# Patient Record
Sex: Male | Born: 1935 | Race: White | Hispanic: No | Marital: Married | Smoking: Never smoker
Health system: Southern US, Community
[De-identification: ages and names within clinical notes are randomized; demographics above are authoritative.]

## PROBLEM LIST (undated history)

## (undated) DIAGNOSIS — N200 Calculus of kidney: Secondary | ICD-10-CM

## (undated) DIAGNOSIS — I10 Essential (primary) hypertension: Secondary | ICD-10-CM

## (undated) DIAGNOSIS — S0003XD Contusion of scalp, subsequent encounter: Secondary | ICD-10-CM

## (undated) DIAGNOSIS — D72829 Elevated white blood cell count, unspecified: Secondary | ICD-10-CM

## (undated) DIAGNOSIS — D351 Benign neoplasm of parathyroid gland: Secondary | ICD-10-CM

## (undated) DIAGNOSIS — R634 Abnormal weight loss: Secondary | ICD-10-CM

## (undated) DIAGNOSIS — I639 Cerebral infarction, unspecified: Secondary | ICD-10-CM

## (undated) DIAGNOSIS — S8010XS Contusion of unspecified lower leg, sequela: Secondary | ICD-10-CM

---

## 1998-05-03 ENCOUNTER — Other Ambulatory Visit: Admission: RE | Admit: 1998-05-03 | Discharge: 1998-05-03 | Payer: Self-pay

## 2001-11-27 ENCOUNTER — Emergency Department (HOSPITAL_COMMUNITY): Admission: EM | Admit: 2001-11-27 | Discharge: 2001-11-27 | Payer: Self-pay | Admitting: *Deleted

## 2002-11-16 ENCOUNTER — Other Ambulatory Visit: Admission: RE | Admit: 2002-11-16 | Discharge: 2002-11-16 | Payer: Self-pay | Admitting: Otolaryngology

## 2004-05-03 ENCOUNTER — Ambulatory Visit: Payer: Self-pay | Admitting: Internal Medicine

## 2008-05-14 ENCOUNTER — Emergency Department (HOSPITAL_COMMUNITY): Admission: EM | Admit: 2008-05-14 | Discharge: 2008-05-14 | Payer: Self-pay | Admitting: Emergency Medicine

## 2010-01-29 ENCOUNTER — Ambulatory Visit (HOSPITAL_COMMUNITY): Admission: RE | Admit: 2010-01-29 | Discharge: 2010-01-29 | Payer: Self-pay | Admitting: Surgery

## 2010-09-16 ENCOUNTER — Encounter: Payer: Self-pay | Admitting: Internal Medicine

## 2010-11-12 LAB — BASIC METABOLIC PANEL
BUN: 18 mg/dL (ref 6–23)
CO2: 28 mEq/L (ref 19–32)
Calcium: 12.1 mg/dL — ABNORMAL HIGH (ref 8.4–10.5)
Chloride: 108 mEq/L (ref 96–112)
Creatinine, Ser: 0.99 mg/dL (ref 0.4–1.5)
GFR calc Af Amer: >60 mL/min (ref >60)
GFR calc non Af Amer: >60 mL/min (ref >60)
Glucose, Bld: 124 mg/dL — ABNORMAL HIGH (ref 70–99)
Potassium: 3.1 mEq/L — ABNORMAL LOW (ref 3.5–5.1)
Sodium: 142 mEq/L (ref 135–145)

## 2010-11-12 LAB — DIFFERENTIAL
Basophils Absolute: 0.1 10*3/uL (ref 0.0–0.1)
Basophils Relative: 1 % (ref 0–1)
Eosinophils Absolute: 0.2 10*3/uL (ref 0.0–0.7)
Eosinophils Relative: 4 % (ref 0–5)
Lymphocytes Relative: 23 % (ref 12–46)
Lymphs Abs: 1.4 10*3/uL (ref 0.7–4.0)
Monocytes Absolute: 0.8 10*3/uL (ref 0.1–1.0)
Monocytes Relative: 12 % (ref 3–12)
Neutro Abs: 3.9 10*3/uL (ref 1.7–7.7)
Neutrophils Relative %: 61 % (ref 43–77)

## 2010-11-12 LAB — URINALYSIS, ROUTINE W REFLEX MICROSCOPIC
Bilirubin Urine: NEGATIVE
Glucose, UA: NEGATIVE mg/dL
Hgb urine dipstick: NEGATIVE
Ketones, ur: NEGATIVE mg/dL
Nitrite: NEGATIVE
Protein, ur: NEGATIVE mg/dL
Specific Gravity, Urine: 1.018 (ref 1.005–1.030)
Urobilinogen, UA: 1 mg/dL (ref 0.0–1.0)
pH: 7 (ref 5.0–8.0)

## 2010-11-12 LAB — POTASSIUM: Potassium: 3.6 mEq/L (ref 3.5–5.1)

## 2010-11-12 LAB — CBC
HCT: 41.6 % (ref 39.0–52.0)
Hemoglobin: 14.8 g/dL (ref 13.0–17.0)
MCHC: 35.5 g/dL (ref 30.0–36.0)
MCV: 99.6 fL (ref 78.0–100.0)
Platelets: 150 10*3/uL (ref 150–400)
RBC: 4.18 MIL/uL — ABNORMAL LOW (ref 4.22–5.81)
RDW: 13.4 % (ref 11.5–15.5)
WBC: 6.4 10*3/uL (ref 4.0–10.5)

## 2010-11-12 LAB — PROTIME-INR
INR: 1.12 (ref 0.00–1.49)
Prothrombin Time: 14.3 seconds (ref 11.6–15.2)

## 2011-05-27 LAB — DIFFERENTIAL
Basophils Absolute: 0
Basophils Relative: 1
Eosinophils Absolute: 0
Eosinophils Relative: 0
Lymphocytes Relative: 5 — ABNORMAL LOW
Lymphs Abs: 0.6 — ABNORMAL LOW
Monocytes Absolute: 0.7
Monocytes Relative: 7
Neutro Abs: 9.5 — ABNORMAL HIGH
Neutrophils Relative %: 88 — ABNORMAL HIGH

## 2011-05-27 LAB — BASIC METABOLIC PANEL
BUN: 14
CO2: 26
Calcium: 11.6 — ABNORMAL HIGH
Chloride: 102
Creatinine, Ser: 1.15
GFR calc Af Amer: >60
GFR calc non Af Amer: >60
Glucose, Bld: 147 — ABNORMAL HIGH
Potassium: 3.5
Sodium: 140

## 2011-05-27 LAB — URINE MICROSCOPIC-ADD ON

## 2011-05-27 LAB — URINALYSIS, ROUTINE W REFLEX MICROSCOPIC
Glucose, UA: NEGATIVE
Leukocytes, UA: NEGATIVE
Nitrite: NEGATIVE
Protein, ur: 100 — AB
Specific Gravity, Urine: 1.026
Urobilinogen, UA: 1
pH: 5.5

## 2011-05-27 LAB — CBC
HCT: 48.5
Hemoglobin: 16.9
MCHC: 34.7
MCV: 101.7 — ABNORMAL HIGH
Platelets: 148 — ABNORMAL LOW
RBC: 4.77
RDW: 13.2
WBC: 10.9 — ABNORMAL HIGH

## 2012-11-19 ENCOUNTER — Other Ambulatory Visit: Payer: Self-pay | Admitting: Dermatology

## 2013-03-31 DIAGNOSIS — G629 Polyneuropathy, unspecified: Secondary | ICD-10-CM | POA: Insufficient documentation

## 2013-03-31 DIAGNOSIS — N200 Calculus of kidney: Secondary | ICD-10-CM | POA: Insufficient documentation

## 2013-03-31 DIAGNOSIS — D351 Benign neoplasm of parathyroid gland: Secondary | ICD-10-CM

## 2013-03-31 HISTORY — DX: Benign neoplasm of parathyroid gland: D35.1

## 2013-03-31 HISTORY — DX: Calculus of kidney: N20.0

## 2013-04-01 DIAGNOSIS — E876 Hypokalemia: Secondary | ICD-10-CM | POA: Insufficient documentation

## 2013-04-01 DIAGNOSIS — I10 Essential (primary) hypertension: Secondary | ICD-10-CM | POA: Insufficient documentation

## 2016-02-01 ENCOUNTER — Ambulatory Visit (INDEPENDENT_AMBULATORY_CARE_PROVIDER_SITE_OTHER): Payer: Medicare Other | Admitting: Physician Assistant

## 2016-02-01 ENCOUNTER — Encounter: Payer: Self-pay | Admitting: Physician Assistant

## 2016-02-01 ENCOUNTER — Ambulatory Visit (INDEPENDENT_AMBULATORY_CARE_PROVIDER_SITE_OTHER): Payer: Medicare Other

## 2016-02-01 VITALS — BP 136/86 | HR 101 | Resp 15 | Wt 176.0 lb

## 2016-02-01 DIAGNOSIS — R112 Nausea with vomiting, unspecified: Secondary | ICD-10-CM | POA: Diagnosis not present

## 2016-02-01 DIAGNOSIS — R1084 Generalized abdominal pain: Secondary | ICD-10-CM

## 2016-02-01 MED ORDER — POLYETHYLENE GLYCOL 3350 17 GM/SCOOP PO POWD: 17.0000 g | Freq: Two times a day (BID) | ORAL | Status: DC | PRN

## 2016-02-01 MED ORDER — RANITIDINE HCL 150 MG PO TABS: 150.0000 mg | ORAL_TABLET | Freq: Two times a day (BID) | ORAL | Status: DC

## 2016-02-01 NOTE — Patient Instructions (Addendum)
Please get plenty of liquids (64 ounces of water at least) and make sure you are getting fiber in your diet (fruit, vegetables, whole grains and legumes).    IF you received an x-ray today, you will receive an invoice from [facility]. Please contact [facility] at [phone] with questions or concerns regarding your invoice.   IF you received labwork today, you will receive an invoice from [employer]. Please contact [facility] at [phone] with questions or concerns regarding your invoice.   Our billing staff will not be able to assist you with questions regarding bills from these companies.  You will be contacted with the lab results as soon as they are available. The fastest way to get your results is to activate your My Chart account. Instructions are located on the last page of this paperwork. If you have not heard from [place] regarding the results in 2 weeks, please contact this office.

## 2016-02-01 NOTE — Progress Notes (Signed)
Patient ID: Ian [name], male     DOB: 02-Jun-1926, 82 y.o.    MRN: [id number]  PCP: Phineas Inches, MD  Chief Complaint  Patient presents with  . Emesis    Since wednesday   . Abdominal Pain    Subjective:    HPI  Presents for evaluation of abdominal pain that began yesterday afternoon.  No BM since yesterday, which is highly unusual for him. Associated with nausea, vomiting and anorexia. By "vomiting" he means coughing up and spitting out phlegm.  He denies chest pain, SOB, dizziness, heartburn/indigestion, increased flatulence, fever/chills. No urinary symptoms.  Outside history reconciled notable for constipation, though he reports he's never had this before.  The patient was uncooperative with our staff during triage and rooming, refusing to provide information regarding his medications, pharmacy, and history.   Prior to Admission medications   Medication Sig Start Date End Date Taking? Authorizing Provider  amLODipine (NORVASC) 10 MG tablet  01/31/16   Historical Provider, MD  aspirin EC 81 MG tablet Take 81 mg by mouth.    Historical Provider, MD  hydrochlorothiazide (HYDRODIURIL) 25 MG tablet  12/19/15   Historical Provider, MD     No Known Allergies   Patient Active Problem List   Diagnosis Date Noted  . Benign hypertension 04/01/2013  . Decreased potassium in the blood 04/01/2013  . 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 Body mass index (BMI) of 27.0-27.9 in adult 03/31/2013  . Neuropathy ([place]) 03/31/2013  . Calculus of kidney 03/31/2013     History reviewed. No pertinent family history.   Social History   Social History  . Marital Status: Married    Spouse Name: N/A  . Number of Children: N/A  . Years of Education: N/A   Occupational History  . Not on file.   Social History Main Topics  . Smoking status: Never Smoker   . Smokeless tobacco: Not on file  . Alcohol Use: Not on file  . Drug Use: Not on file  . Sexual Activity: Not  on file   Other Topics Concern  . Not on file   Social History Narrative  . No narrative on file        Review of Systems  Constitutional: Positive for appetite change. Negative for fever, chills, diaphoresis and fatigue.  HENT: Negative for congestion, sore throat and trouble swallowing.   Eyes: Negative for photophobia, pain and visual disturbance.  Respiratory: Positive for cough (only due to having phlegm in his throat). Negative for choking, chest tightness, shortness of breath and wheezing.   Cardiovascular: Negative for chest pain, palpitations and leg swelling.  Gastrointestinal: Positive for nausea, vomiting (this is acutally gagging/spitting up phlegm), abdominal pain and constipation. Negative for diarrhea, blood in stool, abdominal distention, anal bleeding and rectal pain.  Genitourinary: Negative for dysuria, urgency, frequency and hematuria.  Musculoskeletal: Negative for myalgias and arthralgias.  Skin: Negative for rash.  Neurological: Negative for dizziness and headaches.  Hematological: Negative for adenopathy.         Objective:  Physical Exam  Constitutional: He is oriented to person, place, and time. He appears well-developed and well-nourished. He is active and cooperative. No distress.  BP 136/86 mmHg  Pulse 101  Resp 15  Wt 176 lb (79.833 kg)  SpO2 97%  HENT:  Head: Normocephalic and atraumatic.  Right Ear: Hearing normal.  Left Ear: Hearing normal.  Eyes: Conjunctivae are normal. No scleral icterus.  Neck: Normal range of motion. Neck supple.  No thyromegaly present.  Cardiovascular: Normal rate, regular rhythm and normal heart sounds.   Pulses:      Radial pulses are 2+ on the right side, and 2+ on the left side.  Pulmonary/Chest: Effort normal and breath sounds normal.  Abdominal: Normal appearance and bowel sounds are normal. There is no hepatosplenomegaly. There is tenderness in the periumbilical area. There is guarding (mild, inconsistent,  moreso in the LEFT quadrants). There is no rigidity, no rebound, no tenderness at McBurney's point and negative Murphy's sign.  Lymphadenopathy:       Head (right side): No tonsillar, no preauricular, no posterior auricular and no occipital adenopathy present.       Head (left side): No tonsillar, no preauricular, no posterior auricular and no occipital adenopathy present.    He has no cervical adenopathy.       Right: No supraclavicular adenopathy present.       Left: No supraclavicular adenopathy present.  Neurological: He is alert and oriented to person, place, and time. No sensory deficit.  Skin: Skin is warm, dry and intact. No rash noted. No cyanosis or erythema. Nails show no clubbing.  Psychiatric: He has a normal mood and affect. His speech is normal and behavior is normal.     Dg Abd Acute W/chest  02/01/2016  CLINICAL DATA:  Abdominal pain.  Nausea and vomiting. EXAM: DG ABDOMEN ACUTE W/ 1V CHEST COMPARISON:  None FINDINGS: There is no evidence of dilated bowel loops or free intraperitoneal air. No radiopaque calculi or other significant radiographic abnormality is seen. Heart size and mediastinal contours are within normal limits. Aortic atherosclerosis. Both lungs are clear. IMPRESSION: Negative abdominal radiographs.  No acute cardiopulmonary disease. Electronically Signed   By: Kerby Moors M.D.   On: 02/01/2016 10:09           Assessment & Plan:  1. Generalized abdominal pain Requested comment on stool burden with radiograph reading, which was not provided. Upon my own view, there is a moderate stool burden and large air within the colon, though no dilated loops. Suspect that he is experiencing symptoms due to constipation. Radiographs are reassuring for no obstruction. Miralax. Hydrate. Increase dietary fiber. Monitor for persistent/worsening symptoms. - DG Abd Acute W/Chest; Future - polyethylene glycol powder (GLYCOLAX/MIRALAX) powder; Take 17 g by mouth 2 (two) times  daily as needed.  Dispense: 3350 g; Refill: 0  2. Nausea and vomiting, intractability of vomiting not specified, unspecified vomiting type See above. Supportive care. Ranitidine. Monitor for persistent/worsening symptoms. - ranitidine (ZANTAC) 150 MG tablet; Take 1 tablet (150 mg total) by mouth 2 (two) times daily.  Dispense: 60 tablet; Refill: 0   Fara Chute, PA-C Physician Assistant-Certified [facility]

## 2016-02-01 NOTE — Progress Notes (Signed)
   Subjective:    Patient ID: Ian [name], male    DOB: [birth year], 82 y.o.   MRN: [id number] Chief Complaint  Patient presents with  . Emesis    Since wednesday   . Abdominal Pain    HPI Ian [name] is an 82yo male who presents today with diffuse abdominal pain x 1 days.  States he has not had BM since yesterday morning, which is unusual for him, he's usually very regular.  Admits to nausea, anorexia, and vomiting but on further questioning patient describes gagging while coughing up mucus.  Denies chest pain, heartburn, flatulence, fever, prior history of this pain.     Review of Systems All others negative except those listed in HPI.  Patient Active Problem List   Diagnosis Date Noted  . Benign hypertension 04/01/2013  . Decreased potassium in the blood 04/01/2013  . 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 Body mass index (BMI) of 27.0-27.9 in adult 03/31/2013  . Neuropathy ([place]) 03/31/2013  . Calculus of kidney 03/31/2013   No current outpatient prescriptions on file prior to visit.   No current facility-administered medications on file prior to visit.   No Known Allergies      Objective:   Physical Exam  Constitutional: He is oriented to person, place, and time. He appears well-developed and well-nourished.  Cardiovascular: Regular rhythm, normal heart sounds and intact distal pulses.  Tachycardia present.  Exam reveals no gallop and no friction rub.   No murmur heard. Pulmonary/Chest: Effort normal and breath sounds normal. No respiratory distress.  Abdominal: Soft. Bowel sounds are normal. There is tenderness in the right upper quadrant, epigastric area and left upper quadrant. There is no rigidity and no guarding.  Neurological: He is alert and oriented to person, place, and time.  Skin: Skin is warm and dry.   Dg Abd Acute W/chest  02/01/2016  CLINICAL DATA:  Abdominal pain.  Nausea and vomiting. EXAM: DG ABDOMEN ACUTE W/ 1V CHEST COMPARISON:  None  FINDINGS: There is no evidence of dilated bowel loops or free intraperitoneal air. No radiopaque calculi or other significant radiographic abnormality is seen. Heart size and mediastinal contours are within normal limits. Aortic atherosclerosis. Both lungs are clear. IMPRESSION: Negative abdominal radiographs.  No acute cardiopulmonary disease. Electronically Signed   By: Kerby Moors M.D.   On: 02/01/2016 10:09      Assessment & Plan:  1. Generalized abdominal pain Imaging indicates no acute process.  Fair amount of stool is present in colon.  Pain is likely due do constipation.  Patient prescribed miralax powder to help flush out his colon.  Instructed to drink at least 64oz of water a day and be sure to include fiber in his to help prevent constipation. - DG Abd Acute W/Chest; Future - polyethylene glycol powder (GLYCOLAX/MIRALAX) powder; Take 17 g by mouth 2 (two) times daily as needed.  Dispense: 3350 g; Refill: 0  2. Nausea and vomiting, intractability of vomiting not specified, unspecified vomiting type Patient given zantac to help with the feelings of nausea and gagging on mucus. - ranitidine (ZANTAC) 150 MG tablet; Take 1 tablet (150 mg total) by mouth 2 (two) times daily.  Dispense: 60 tablet; Refill: 0  Patient instructed to follow up if symptoms worsen or fail to improve with treatment or if any other questions or concerns arise.  Mihailo Sage P. Khylon Davies, PA-S

## 2017-10-25 ENCOUNTER — Other Ambulatory Visit: Payer: Self-pay

## 2017-10-25 ENCOUNTER — Emergency Department (HOSPITAL_COMMUNITY): Payer: Medicare Other

## 2017-10-25 ENCOUNTER — Inpatient Hospital Stay (HOSPITAL_COMMUNITY)
Admission: EM | Admit: 2017-10-25 | Discharge: 2017-10-29 | DRG: 064 | Disposition: A | Discharge status: Rehabilitation Facility | Payer: Medicare Other | Attending: Family Medicine | Admitting: Family Medicine

## 2017-10-25 ENCOUNTER — Inpatient Hospital Stay (HOSPITAL_COMMUNITY): Payer: Medicare Other

## 2017-10-25 DIAGNOSIS — F0390 Unspecified dementia without behavioral disturbance: Secondary | ICD-10-CM | POA: Diagnosis present

## 2017-10-25 DIAGNOSIS — R29810 Facial weakness: Secondary | ICD-10-CM | POA: Diagnosis present

## 2017-10-25 DIAGNOSIS — I2510 Atherosclerotic heart disease of native coronary artery without angina pectoris: Secondary | ICD-10-CM | POA: Diagnosis present

## 2017-10-25 DIAGNOSIS — R1310 Dysphagia, unspecified: Secondary | ICD-10-CM | POA: Diagnosis not present

## 2017-10-25 DIAGNOSIS — N179 Acute kidney failure, unspecified: Secondary | ICD-10-CM | POA: Diagnosis present

## 2017-10-25 DIAGNOSIS — L89202 Pressure ulcer of unspecified hip, stage 2: Secondary | ICD-10-CM | POA: Diagnosis present

## 2017-10-25 DIAGNOSIS — I69351 Hemiplegia and hemiparesis following cerebral infarction affecting right dominant side: Secondary | ICD-10-CM | POA: Diagnosis not present

## 2017-10-25 DIAGNOSIS — W19XXXS Unspecified fall, sequela: Secondary | ICD-10-CM

## 2017-10-25 DIAGNOSIS — R29700 NIHSS score 0: Secondary | ICD-10-CM | POA: Diagnosis present

## 2017-10-25 DIAGNOSIS — I493 Ventricular premature depolarization: Secondary | ICD-10-CM | POA: Diagnosis present

## 2017-10-25 DIAGNOSIS — R300 Dysuria: Secondary | ICD-10-CM | POA: Diagnosis present

## 2017-10-25 DIAGNOSIS — I639 Cerebral infarction, unspecified: Secondary | ICD-10-CM | POA: Diagnosis not present

## 2017-10-25 DIAGNOSIS — Z9181 History of falling: Secondary | ICD-10-CM

## 2017-10-25 DIAGNOSIS — I6349 Cerebral infarction due to embolism of other cerebral artery: Principal | ICD-10-CM | POA: Diagnosis present

## 2017-10-25 DIAGNOSIS — G92 Toxic encephalopathy: Secondary | ICD-10-CM | POA: Diagnosis present

## 2017-10-25 DIAGNOSIS — R740 Nonspecific elevation of levels of transaminase and lactic acid dehydrogenase [LDH]: Secondary | ICD-10-CM | POA: Diagnosis present

## 2017-10-25 DIAGNOSIS — R319 Hematuria, unspecified: Secondary | ICD-10-CM | POA: Diagnosis present

## 2017-10-25 DIAGNOSIS — Z7982 Long term (current) use of aspirin: Secondary | ICD-10-CM

## 2017-10-25 DIAGNOSIS — R55 Syncope and collapse: Secondary | ICD-10-CM | POA: Diagnosis present

## 2017-10-25 DIAGNOSIS — E872 Acidosis: Secondary | ICD-10-CM | POA: Diagnosis present

## 2017-10-25 DIAGNOSIS — I110 Hypertensive heart disease with heart failure: Secondary | ICD-10-CM | POA: Diagnosis not present

## 2017-10-25 DIAGNOSIS — T796XXA Traumatic ischemia of muscle, initial encounter: Secondary | ICD-10-CM

## 2017-10-25 DIAGNOSIS — Z7289 Other problems related to lifestyle: Secondary | ICD-10-CM

## 2017-10-25 DIAGNOSIS — R748 Abnormal levels of other serum enzymes: Secondary | ICD-10-CM | POA: Diagnosis present

## 2017-10-25 DIAGNOSIS — E041 Nontoxic single thyroid nodule: Secondary | ICD-10-CM | POA: Diagnosis present

## 2017-10-25 DIAGNOSIS — I452 Bifascicular block: Secondary | ICD-10-CM | POA: Diagnosis present

## 2017-10-25 DIAGNOSIS — R7989 Other specified abnormal findings of blood chemistry: Secondary | ICD-10-CM

## 2017-10-25 DIAGNOSIS — I5189 Other ill-defined heart diseases: Secondary | ICD-10-CM

## 2017-10-25 DIAGNOSIS — I10 Essential (primary) hypertension: Secondary | ICD-10-CM | POA: Diagnosis present

## 2017-10-25 DIAGNOSIS — I4510 Unspecified right bundle-branch block: Secondary | ICD-10-CM | POA: Diagnosis present

## 2017-10-25 DIAGNOSIS — I440 Atrioventricular block, first degree: Secondary | ICD-10-CM | POA: Diagnosis present

## 2017-10-25 DIAGNOSIS — I5030 Unspecified diastolic (congestive) heart failure: Secondary | ICD-10-CM | POA: Diagnosis not present

## 2017-10-25 DIAGNOSIS — R7401 Elevation of levels of liver transaminase levels: Secondary | ICD-10-CM | POA: Diagnosis present

## 2017-10-25 DIAGNOSIS — F05 Delirium due to known physiological condition: Secondary | ICD-10-CM | POA: Diagnosis present

## 2017-10-25 DIAGNOSIS — I63331 Cerebral infarction due to thrombosis of right posterior cerebral artery: Secondary | ICD-10-CM | POA: Diagnosis not present

## 2017-10-25 DIAGNOSIS — Z79890 Hormone replacement therapy (postmenopausal): Secondary | ICD-10-CM

## 2017-10-25 DIAGNOSIS — Z8673 Personal history of transient ischemic attack (TIA), and cerebral infarction without residual deficits: Secondary | ICD-10-CM

## 2017-10-25 DIAGNOSIS — J349 Unspecified disorder of nose and nasal sinuses: Secondary | ICD-10-CM | POA: Diagnosis present

## 2017-10-25 DIAGNOSIS — Y92009 Unspecified place in unspecified non-institutional (private) residence as the place of occurrence of the external cause: Secondary | ICD-10-CM

## 2017-10-25 DIAGNOSIS — R778 Other specified abnormalities of plasma proteins: Secondary | ICD-10-CM

## 2017-10-25 DIAGNOSIS — J320 Chronic maxillary sinusitis: Secondary | ICD-10-CM

## 2017-10-25 DIAGNOSIS — D72829 Elevated white blood cell count, unspecified: Secondary | ICD-10-CM | POA: Diagnosis present

## 2017-10-25 DIAGNOSIS — R4189 Other symptoms and signs involving cognitive functions and awareness: Secondary | ICD-10-CM | POA: Diagnosis present

## 2017-10-25 DIAGNOSIS — Z79899 Other long term (current) drug therapy: Secondary | ICD-10-CM

## 2017-10-25 DIAGNOSIS — S8010XS Contusion of unspecified lower leg, sequela: Secondary | ICD-10-CM | POA: Diagnosis not present

## 2017-10-25 DIAGNOSIS — I69391 Dysphagia following cerebral infarction: Secondary | ICD-10-CM | POA: Diagnosis not present

## 2017-10-25 DIAGNOSIS — R32 Unspecified urinary incontinence: Secondary | ICD-10-CM | POA: Diagnosis present

## 2017-10-25 DIAGNOSIS — R4182 Altered mental status, unspecified: Secondary | ICD-10-CM

## 2017-10-25 DIAGNOSIS — L8990 Pressure ulcer of unspecified site, unspecified stage: Secondary | ICD-10-CM | POA: Diagnosis present

## 2017-10-25 DIAGNOSIS — M6282 Rhabdomyolysis: Secondary | ICD-10-CM | POA: Diagnosis not present

## 2017-10-25 DIAGNOSIS — R634 Abnormal weight loss: Secondary | ICD-10-CM | POA: Diagnosis present

## 2017-10-25 DIAGNOSIS — W19XXXA Unspecified fall, initial encounter: Secondary | ICD-10-CM

## 2017-10-25 DIAGNOSIS — E876 Hypokalemia: Secondary | ICD-10-CM | POA: Diagnosis present

## 2017-10-25 DIAGNOSIS — E039 Hypothyroidism, unspecified: Secondary | ICD-10-CM | POA: Diagnosis present

## 2017-10-25 DIAGNOSIS — I519 Heart disease, unspecified: Secondary | ICD-10-CM | POA: Diagnosis not present

## 2017-10-25 DIAGNOSIS — K5900 Constipation, unspecified: Secondary | ICD-10-CM | POA: Diagnosis present

## 2017-10-25 DIAGNOSIS — I248 Other forms of acute ischemic heart disease: Secondary | ICD-10-CM | POA: Diagnosis not present

## 2017-10-25 DIAGNOSIS — J323 Chronic sphenoidal sinusitis: Secondary | ICD-10-CM

## 2017-10-25 DIAGNOSIS — R609 Edema, unspecified: Secondary | ICD-10-CM | POA: Diagnosis not present

## 2017-10-25 DIAGNOSIS — G8191 Hemiplegia, unspecified affecting right dominant side: Secondary | ICD-10-CM | POA: Diagnosis present

## 2017-10-25 DIAGNOSIS — R461 Bizarre personal appearance: Secondary | ICD-10-CM | POA: Diagnosis present

## 2017-10-25 DIAGNOSIS — S4991XD Unspecified injury of right shoulder and upper arm, subsequent encounter: Secondary | ICD-10-CM

## 2017-10-25 DIAGNOSIS — K922 Gastrointestinal hemorrhage, unspecified: Secondary | ICD-10-CM | POA: Diagnosis not present

## 2017-10-25 DIAGNOSIS — R42 Dizziness and giddiness: Secondary | ICD-10-CM

## 2017-10-25 DIAGNOSIS — I63431 Cerebral infarction due to embolism of right posterior cerebral artery: Secondary | ICD-10-CM | POA: Diagnosis not present

## 2017-10-25 DIAGNOSIS — I712 Thoracic aortic aneurysm, without rupture: Secondary | ICD-10-CM | POA: Diagnosis present

## 2017-10-25 DIAGNOSIS — I6300 Cerebral infarction due to thrombosis of unspecified precerebral artery: Secondary | ICD-10-CM | POA: Diagnosis not present

## 2017-10-25 DIAGNOSIS — S0990XA Unspecified injury of head, initial encounter: Secondary | ICD-10-CM | POA: Diagnosis present

## 2017-10-25 DIAGNOSIS — S0003XD Contusion of scalp, subsequent encounter: Secondary | ICD-10-CM

## 2017-10-25 DIAGNOSIS — T796XXS Traumatic ischemia of muscle, sequela: Secondary | ICD-10-CM | POA: Diagnosis not present

## 2017-10-25 DIAGNOSIS — R000 Tachycardia, unspecified: Secondary | ICD-10-CM | POA: Diagnosis present

## 2017-10-25 DIAGNOSIS — Z86018 Personal history of other benign neoplasm: Secondary | ICD-10-CM

## 2017-10-25 DIAGNOSIS — S0990XD Unspecified injury of head, subsequent encounter: Secondary | ICD-10-CM | POA: Diagnosis not present

## 2017-10-25 DIAGNOSIS — J322 Chronic ethmoidal sinusitis: Secondary | ICD-10-CM | POA: Diagnosis present

## 2017-10-25 DIAGNOSIS — Z87442 Personal history of urinary calculi: Secondary | ICD-10-CM

## 2017-10-25 HISTORY — DX: Benign neoplasm of parathyroid gland: D35.1

## 2017-10-25 HISTORY — DX: Contusion of unspecified lower leg, sequela: S80.10XS

## 2017-10-25 HISTORY — DX: Abnormal weight loss: R63.4

## 2017-10-25 HISTORY — DX: Elevated white blood cell count, unspecified: D72.829

## 2017-10-25 HISTORY — DX: Calculus of kidney: N20.0

## 2017-10-25 HISTORY — DX: Contusion of scalp, subsequent encounter: S00.03XD

## 2017-10-25 LAB — DIFFERENTIAL
Band Neutrophils: 0 %
Basophils Absolute: 0 10*3/uL (ref 0.0–0.1)
Basophils Relative: 0 %
Blasts: 0 %
Eosinophils Absolute: 0 10*3/uL (ref 0.0–0.7)
Eosinophils Relative: 0 %
Lymphocytes Relative: 4 %
Lymphs Abs: 0.6 10*3/uL — ABNORMAL LOW (ref 0.7–4.0)
Metamyelocytes Relative: 0 %
Monocytes Absolute: 1.2 10*3/uL — ABNORMAL HIGH (ref 0.1–1.0)
Monocytes Relative: 8 %
Myelocytes: 0 %
Neutro Abs: 13.8 10*3/uL — ABNORMAL HIGH (ref 1.7–7.7)
Neutrophils Relative %: 88 %
Other: 0 %
Promyelocytes Absolute: 0 %
nRBC: 0 /100 WBC

## 2017-10-25 LAB — URINALYSIS, ROUTINE W REFLEX MICROSCOPIC
Bacteria, UA: NONE SEEN
Bilirubin Urine: NEGATIVE
Glucose, UA: NEGATIVE mg/dL
Ketones, ur: 5 mg/dL — AB
Leukocytes, UA: NEGATIVE
Nitrite: NEGATIVE
Protein, ur: 100 mg/dL — AB
Specific Gravity, Urine: 1.024 (ref 1.005–1.030)
Squamous Epithelial / LPF: NONE SEEN
pH: 5 (ref 5.0–8.0)

## 2017-10-25 LAB — I-STAT CHEM 8, ED
BUN: 25 mg/dL — ABNORMAL HIGH (ref 6–20)
Calcium, Ion: 1.43 mmol/L — ABNORMAL HIGH (ref 1.15–1.40)
Chloride: 103 mmol/L (ref 101–111)
Creatinine, Ser: 1.1 mg/dL (ref 0.61–1.24)
Glucose, Bld: 181 mg/dL — ABNORMAL HIGH (ref 65–99)
HCT: 48 % (ref 39.0–52.0)
Hemoglobin: 16.3 g/dL (ref 13.0–17.0)
Potassium: 3.7 mmol/L (ref 3.5–5.1)
Sodium: 139 mmol/L (ref 135–145)
TCO2: 22 mmol/L (ref 22–32)

## 2017-10-25 LAB — COMPREHENSIVE METABOLIC PANEL
ALT: 39 U/L (ref 17–63)
AST: 140 U/L — ABNORMAL HIGH (ref 15–41)
Albumin: 3.7 g/dL (ref 3.5–5.0)
Alkaline Phosphatase: 66 U/L (ref 38–126)
Anion gap: 15 (ref 5–15)
BUN: 24 mg/dL — ABNORMAL HIGH (ref 6–20)
CO2: 22 mmol/L (ref 22–32)
Calcium: 12 mg/dL — ABNORMAL HIGH (ref 8.9–10.3)
Chloride: 101 mmol/L (ref 101–111)
Creatinine, Ser: 1.32 mg/dL — ABNORMAL HIGH (ref 0.61–1.24)
GFR calc Af Amer: 53 mL/min — ABNORMAL LOW (ref >60)
GFR calc non Af Amer: 46 mL/min — ABNORMAL LOW (ref >60)
Glucose, Bld: 186 mg/dL — ABNORMAL HIGH (ref 65–99)
Potassium: 3.7 mmol/L (ref 3.5–5.1)
Sodium: 138 mmol/L (ref 135–145)
Total Bilirubin: 1.8 mg/dL — ABNORMAL HIGH (ref 0.3–1.2)
Total Protein: 7.7 g/dL (ref 6.5–8.1)

## 2017-10-25 LAB — CBG MONITORING, ED: Glucose-Capillary: 180 mg/dL — ABNORMAL HIGH (ref 65–99)

## 2017-10-25 LAB — RAPID URINE DRUG SCREEN, HOSP PERFORMED
Amphetamines: NOT DETECTED
Barbiturates: NOT DETECTED
Benzodiazepines: NOT DETECTED
Cocaine: NOT DETECTED
Opiates: NOT DETECTED
Tetrahydrocannabinol: NOT DETECTED

## 2017-10-25 LAB — I-STAT TROPONIN, ED: Troponin i, poc: 0.11 ng/mL (ref 0.00–0.08)

## 2017-10-25 LAB — MAGNESIUM: Magnesium: 1.1 mg/dL — ABNORMAL LOW (ref 1.7–2.4)

## 2017-10-25 LAB — CBC
HCT: 46.8 % (ref 39.0–52.0)
Hemoglobin: 16.7 g/dL (ref 13.0–17.0)
MCH: 34.6 pg — ABNORMAL HIGH (ref 26.0–34.0)
MCHC: 35.7 g/dL (ref 30.0–36.0)
MCV: 97.1 fL (ref 78.0–100.0)
Platelets: 215 10*3/uL (ref 150–400)
RBC: 4.82 MIL/uL (ref 4.22–5.81)
RDW: 12.8 % (ref 11.5–15.5)
WBC: 15.6 10*3/uL — ABNORMAL HIGH (ref 4.0–10.5)

## 2017-10-25 LAB — I-STAT CG4 LACTIC ACID, ED
Lactic Acid, Venous: 5.64 mmol/L (ref 0.5–1.9)
Lactic Acid, Venous: 4.82 mmol/L (ref 0.5–1.9)

## 2017-10-25 LAB — CK: Total CK: 5905 U/L — ABNORMAL HIGH (ref 49–397)

## 2017-10-25 LAB — TROPONIN I: Troponin I: 0.13 ng/mL (ref <0.03)

## 2017-10-25 LAB — ETHANOL: Alcohol, Ethyl (B): <10 mg/dL (ref <10)

## 2017-10-25 LAB — HEMOGLOBIN A1C
Hgb A1c MFr Bld: 4.8 % (ref 4.8–5.6)
Mean Plasma Glucose: 91.06 mg/dL

## 2017-10-25 LAB — POC OCCULT BLOOD, ED: Fecal Occult Bld: NEGATIVE

## 2017-10-25 LAB — PROTIME-INR
INR: 1.06
Prothrombin Time: 13.7 seconds (ref 11.4–15.2)

## 2017-10-25 LAB — APTT: aPTT: 26 seconds (ref 24–36)

## 2017-10-25 LAB — TSH: TSH: 9.181 u[IU]/mL — ABNORMAL HIGH (ref 0.350–4.500)

## 2017-10-25 MED ORDER — SODIUM CHLORIDE 0.9 % IV BOLUS (SEPSIS)
500.0000 mL | Freq: Once | INTRAVENOUS | Status: AC
  Administered 2017-10-25: 500 mL via INTRAVENOUS

## 2017-10-25 MED ORDER — VITAMIN B-1 100 MG PO TABS: 100.0000 mg | ORAL_TABLET | Freq: Every day | ORAL | Status: DC

## 2017-10-25 MED ORDER — ASPIRIN EC 81 MG PO TBEC: 81.0000 mg | DELAYED_RELEASE_TABLET | Freq: Every day | ORAL | Status: DC

## 2017-10-25 MED ORDER — VANCOMYCIN HCL IN DEXTROSE 1-5 GM/200ML-% IV SOLN
1000.0000 mg | Freq: Once | INTRAVENOUS | Status: AC
Start: 2017-10-25 — End: 2017-10-25
  Administered 2017-10-25: 1000 mg via INTRAVENOUS
  Filled 2017-10-25: qty 200

## 2017-10-25 MED ORDER — GADOBENATE DIMEGLUMINE 529 MG/ML IV SOLN
14.0000 mL | Freq: Once | INTRAVENOUS | Status: AC | PRN
  Administered 2017-10-25: 14 mL via INTRAVENOUS

## 2017-10-25 MED ORDER — HEPARIN SODIUM (PORCINE) 5000 UNIT/ML IJ SOLN
5000.0000 [IU] | Freq: Three times a day (TID) | INTRAMUSCULAR | Status: DC
  Administered 2017-10-26 – 2017-10-27 (×6): 5000 [IU] via SUBCUTANEOUS
  Filled 2017-10-25 (×6): qty 1

## 2017-10-25 MED ORDER — THIAMINE HCL 100 MG/ML IJ SOLN: 100.0000 mg | Freq: Every day | INTRAMUSCULAR | Status: DC

## 2017-10-25 MED ORDER — ALPRAZOLAM 0.25 MG PO TABS
0.2500 mg | ORAL_TABLET | ORAL | Status: DC | PRN
  Administered 2017-10-26: 0.25 mg via ORAL
  Filled 2017-10-25: qty 1

## 2017-10-25 MED ORDER — ONDANSETRON HCL 4 MG/2ML IJ SOLN: 4.0000 mg | Freq: Four times a day (QID) | INTRAMUSCULAR | Status: DC | PRN

## 2017-10-25 MED ORDER — SODIUM CHLORIDE 0.9 % IV SOLN
INTRAVENOUS | Status: DC
  Administered 2017-10-25: 21:00:00 via INTRAVENOUS

## 2017-10-25 MED ORDER — ASPIRIN 81 MG PO CHEW: 324.0000 mg | CHEWABLE_TABLET | ORAL | Status: DC

## 2017-10-25 MED ORDER — PIPERACILLIN-TAZOBACTAM 3.375 G IVPB 30 MIN
3.3750 g | Freq: Once | INTRAVENOUS | Status: AC
  Administered 2017-10-25: 3.375 g via INTRAVENOUS
  Filled 2017-10-25: qty 50

## 2017-10-25 MED ORDER — STROKE: EARLY STAGES OF RECOVERY BOOK
Freq: Once | Status: AC
  Administered 2017-10-26: 06:00:00
  Filled 2017-10-25: qty 1

## 2017-10-25 MED ORDER — NITROGLYCERIN 0.4 MG SL SUBL: 0.4000 mg | SUBLINGUAL_TABLET | SUBLINGUAL | Status: DC | PRN

## 2017-10-25 MED ORDER — ATORVASTATIN CALCIUM 40 MG PO TABS: 40.0000 mg | ORAL_TABLET | Freq: Every day | ORAL | Status: DC

## 2017-10-25 MED ORDER — FOLIC ACID 1 MG PO TABS: 1.0000 mg | ORAL_TABLET | Freq: Every day | ORAL | Status: DC

## 2017-10-25 MED ORDER — SODIUM CHLORIDE 0.9 % IV BOLUS (SEPSIS)
1000.0000 mL | Freq: Once | INTRAVENOUS | Status: AC
  Administered 2017-10-25: 1000 mL via INTRAVENOUS

## 2017-10-25 MED ORDER — ASPIRIN 300 MG RE SUPP: 300.0000 mg | RECTAL | Status: DC

## 2017-10-25 MED ORDER — ADULT MULTIVITAMIN W/MINERALS CH: 1.0000 | ORAL_TABLET | Freq: Every day | ORAL | Status: DC

## 2017-10-25 NOTE — ED Notes (Signed)
Attempted report 

## 2017-10-25 NOTE — ED Notes (Signed)
Condom catheter applied with urinary bed bag, will in and out if no urine available.

## 2017-10-25 NOTE — ED Notes (Signed)
I Stat Lactic Acid results 5.64, and I Stat TropI 0.11 informed to Dr. Hart Robinsons. Results repeated back.

## 2017-10-25 NOTE — ED Notes (Signed)
Patient transported to X-ray 

## 2017-10-25 NOTE — ED Notes (Signed)
I Stat Lactic Acid results reported to Dr. Hart Robinsons, results repeated back.

## 2017-10-25 NOTE — ED Notes (Signed)
Attempted report. Per charge all back in 5 minutes.

## 2017-10-25 NOTE — Progress Notes (Signed)
Pt admitted from ED s/p fall, pt alert and oriented, denies any pain but observed to yell whenever he moves in bed, settled in bed with call light at bedside, tele monitor put and verified on pt, was however reassured, will continue to monitor. Carmela Rima

## 2017-10-25 NOTE — ED Provider Notes (Signed)
[name] Provider Note   CSN: [id number] Arrival date & time:        History   Chief Complaint Chief Complaint  Patient presents with  . Cerebrovascular Accident  . Altered Mental Status    HPI Ian [name] is a 84 y.o. male. Level 5 caveat-patient amnestic for event HPI   84 year old man brought in via EMS history of hypertension reports he was found on the bathroom floor.  Wife states that he was last known normal approximately 9 PM last night.  She states that he will went into the bathroom.  She is not sure what happened.  She went to find him about 10 PM and he was on the ground in the bathroom.  She describes as a very small bathroom.  She states that he told her he did not need any help and was just laying down.  She slept during the night and this morning he was still on the ground.  She states that eventually when he could not get up she called EMS.  He states he was fine yesterday.  He cooked dinner.  She has not noted that he has been sick. No past medical history on file.  Patient Active Problem List   Diagnosis Date Noted  . Benign hypertension 04/01/2013  . Decreased potassium in the blood 04/01/2013  . 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 Body mass index (BMI) of 27.0-27.9 in adult 03/31/2013  . Neuropathy 03/31/2013  . Calculus of kidney 03/31/2013    No past surgical history on file.     Home Medications    Prior to Admission medications   Medication Sig Start Date End Date Taking? Authorizing Provider  amLODipine (NORVASC) 10 MG tablet  01/31/16   [provider]  aspirin EC 81 MG tablet Take 81 mg by mouth.    [provider]  hydrochlorothiazide (HYDRODIURIL) 25 MG tablet  12/19/15   [provider]  polyethylene glycol powder (GLYCOLAX/MIRALAX) powder Take 17 g by mouth 2 (two) times daily as needed. 02/01/16   Harrison Mons, PA-C  ranitidine (ZANTAC) 150 MG tablet  Take 1 tablet (150 mg total) by mouth 2 (two) times daily. 02/01/16   Harrison Mons, PA-C    Family History No family history on file.  Social History Social History   Tobacco Use  . Smoking status: Never Smoker  Substance Use Topics  . Alcohol use: Not on file  . Drug use: Not on file     Allergies   Patient has no known allergies.   Review of Systems Review of Systems  Unable to perform ROS: Mental status change     Physical Exam Updated Vital Signs BP 138/90 (BP Location: Right Arm)   Pulse 83   Temp 98.7 F (37.1 C)   Resp 17   Ht 1.803 m (5\' 11" )   Wt 79.8 kg (176 lb)   SpO2 96%   BMI 24.55 kg/m   Physical Exam  Constitutional: He appears well-developed. No distress.  Unkempt, multiple contusion  HENT:  Head:    Right Ear: External ear normal.  Left Ear: External ear normal.  Erythema and edema right side of face Edema right eyelid Black crusting right side of mouth Mucous membranes are dry  Eyes: Pupils are equal, round, and reactive to light.  Patient does not gaze right or left- seems to have difficulty understanding - unclear visual acuity- will state which finger? Does not blink with  confrontation with fingers  Neck: No JVD present. No tracheal deviation present. No thyromegaly present.  Cardiovascular: Normal rate and regular rhythm.  Pulmonary/Chest: Effort normal.  Abdominal: Soft. Bowel sounds are normal.  Musculoskeletal:  Multiple contusions and abrasions of extremities - contusion lue, abrasions rue and rle, ttp right shoulder with contusion  Neurological: He is alert. He displays normal reflexes. No cranial nerve deficit. He exhibits normal muscle tone.  Patient oriented to person place and year He is able to move bilateral lower extremities equally against gravity lue against gravity Rue movement limited due to pain per patient  Skin: Skin is dry. Capillary refill takes less than 2 seconds.  Nursing note and vitals  reviewed.    ED Treatments / Results  Labs (all labs ordered are listed, but only abnormal results are displayed) Labs Reviewed  CBG MONITORING, ED - Abnormal; Notable for the following components:      Result Value   Glucose-Capillary 180 (*)    All other components within normal limits  ETHANOL  PROTIME-INR  APTT  CBC  DIFFERENTIAL  COMPREHENSIVE METABOLIC PANEL  RAPID URINE DRUG SCREEN, HOSP PERFORMED  URINALYSIS, ROUTINE W REFLEX MICROSCOPIC  I-STAT CHEM 8, ED  I-STAT TROPONIN, ED    EKG  EKG Interpretation  Date/Time:  Saturday October 25 2017 14:46:47 EST Ventricular Rate:  90 PR Interval:    QRS Duration: 145 QT Interval:  395 QTC Calculation: 484 R Axis:   -164 Text Interpretation:  Sinus or ectopic atrial rhythm Prolonged PR interval Consider dextrocardia Confirmed by Pattricia Boss [phone]) on 10/25/2017 4:56:20 PM       Radiology Dg Shoulder Right  Result Date: 10/25/2017 CLINICAL DATA:  Pain after fall. EXAM: RIGHT SHOULDER - 2+ VIEW COMPARISON:  None. FINDINGS: There is no evidence of fracture or dislocation. There is no evidence of arthropathy or other focal bone abnormality. Soft tissues are unremarkable. IMPRESSION: Negative. Electronically Signed   By: Dorise Bullion III M.D   On: 10/25/2017 16:42   Ct Head Wo Contrast  Result Date: 10/25/2017 CLINICAL DATA:  84 year old male with history of trauma from a fall to the floor yesterday evening at 10 p.m. Right-sided weakness. EXAM: CT HEAD WITHOUT CONTRAST TECHNIQUE: Contiguous axial images were obtained from the base of the skull through the vertex without intravenous contrast. COMPARISON:  None. FINDINGS: Brain: Moderate cerebral and cerebellar atrophy. Patchy and confluent areas of decreased attenuation are noted throughout the deep and periventricular white matter of the cerebral hemispheres bilaterally, compatible with chronic microvascular ischemic disease. No evidence of acute infarction, hemorrhage,  hydrocephalus, extra-axial collection or mass lesion/mass effect. Vascular: No hyperdense vessel or unexpected calcification. Skull: Normal. Negative for fracture or focal lesion. Sinuses/Orbits: No acute finding. Other: Soft tissue swelling in the scalp near the vertex. IMPRESSION: 1. Soft tissue swelling in the scalp near the vertex, presumably a hematoma. No acute intracranial abnormalities. 2. Moderate cerebral and cerebellar atrophy with extensive chronic microvascular ischemic changes in the cerebral white matter. Electronically Signed   By: Vinnie Langton M.D.   On: 10/25/2017 17:01   Dg Chest Port 1 View  Result Date: 10/25/2017 CLINICAL DATA:  Right-sided weakness, right-sided neglect EXAM: PORTABLE CHEST 1 VIEW COMPARISON:  01/25/2010 FINDINGS: There is no focal parenchymal opacity. There is no pleural effusion or pneumothorax. The heart and mediastinal contours are unremarkable. There is mild osteoarthritis of bilateral glenohumeral joints. IMPRESSION: No active disease. Electronically Signed   By: Kathreen Devoid   On: 10/25/2017 15:55  Dg Hips Bilat With Pelvis 3-4 Views  Result Date: 10/25/2017 CLINICAL DATA:  Pain after fall EXAM: DG HIP (WITH OR WITHOUT PELVIS) 3-4V BILAT COMPARISON:  None. FINDINGS: There is no evidence of hip fracture or dislocation. There is no evidence of arthropathy or other focal bone abnormality. IMPRESSION: Negative. Electronically Signed   By: Dorise Bullion III M.D   On: 10/25/2017 16:46    Procedures Procedures (including critical care time)  Medications Ordered in ED Medications - No data to display   Initial Impression / Assessment and Plan / ED Course  I have reviewed the triage vital signs and the nursing notes.  Pertinent labs & imaging results that were available during my care of the patient were reviewed by me and considered in my medical decision making (see chart for details).      5:28 PM Awaiting urine results. Plan foley IV hydration  being given Vitals:   10/25/17 1645 10/25/17 1700  BP: (!) 179/111 (!) 174/110  Pulse: 88 89  Resp:  13  Temp:    SpO2: 95% 100%    1-found on ground- fall vs syncope- multiple contusions and abrasions, right shoulder pain- no evidence of fracture noted CT head without acute changes 2-rhabdomyolysis 3-leukocytosis- ? Infection., cultures obtained, lactic acidosis noted - nonspecific given that patient has been laying on ground for extended period of time 4- altered mental status- patient does not have lateralized deficits although initially he was identified as having right-sided weakness.  This appears due to pain in the right shoulder.  However, cannot be ruled out for stroke at this time.  CT does not show any evidence of acute ischemia and he does outside the window for TPA and does not qualify as being a code stroke without being LVO positive. 5 hypertension patient was initially normotensive and is now hypertensive.  He has a known history of hypertension. 6 troponin is positive with no evidence of acute ischemia noted on EKG and no complaints of chest pain  CRITICAL CARE Performed by: Pattricia Boss Total critical care time: 60 minutes Critical care time was exclusive of separately billable procedures and treating other patients. Critical care was necessary to treat or prevent imminent or life-threatening deterioration. Critical care was time spent personally by me on the following activities: development of treatment plan with patient and/or surrogate as well as nursing, discussions with consultants, evaluation of patient's response to treatment, examination of patient, obtaining history from patient or surrogate, ordering and performing treatments and interventions, ordering and review of laboratory studies, ordering and review of radiographic studies, pulse oximetry and re-evaluation of patient's condition.   Discussed with Dr. Burr Medico, on for [facility] and will see for admission Final  Clinical Impressions(s) / ED Diagnoses   Final diagnoses:  Traumatic rhabdomyolysis, initial encounter ([place])  Elevated troponin  Altered mental status, unspecified altered mental status type    ED Discharge Orders    None       Pattricia Boss, MD 10/25/17 1757

## 2017-10-25 NOTE — H&P (Addendum)
[facility] Admission History and Physical Service Pager: [phone]  Patient name: Ian [name] Medical record number: [id number] Date of birth: November 18, 1925 Age: 84 y.o. Gender: male  Primary Care Provider: Bernerd Limbo, MD Consultants: None Code Status: Full code confirmed this admission  Chief Complaint: Syncope, rhabdomyolysis  Assessment and Plan: Ian [name] is a 84 y.o. male presenting with episode of syncope, unwitnessed and subsequent rhabdomyolysis. PMH is significant for hypertension.  #syncope - uncertain etiology; unwitnessed`fall and patient does not remember the event.  Consider stroke given new neurologic changes with RLE weakness and confusion worse than baseline.  CT head negative for acute hemorrhage, though positive for extensive chronic microvascular ischemic changes, and soft tissue hematoma.  MRI W/WO contrast pending.  Cardiac etiology also considered. Patient does have a elevated troponin which may be in the setting of rhabdomyolysis.  No ST-T changes noted on EKG.  No chest pain, dyspnea or history of palpitations and no known cardiac history.  Seizure additionally considered given blood in the patient's mouth.  He has never had a seizure before and if there is no witnessed seizure activity.  Additionally infection must be considered as cause of syncope.  Chest x-ray negative. Leukocytosis at 15.6.  UA pending, no skin breakdown source of possible infection. -Admit to telemetry, attending Dr. McDiarmid -MRI brain -Neurochecks every 4 hours -Cardiac echo, a.m. EKG -Started vital signs -PT/OT -S/p vancomycin and Zosyn x1 in the ED -Continue to monitor for fevers -We will hold off starting empiric antibiotics as we have no source for infection at this time -Consider neuro consult pending MRI brain results - keep SBP<220 until MRI results for permissive HTN  #elevated troponin -i-STAT troponin 0 0.11 in the emergency department.  No chest  pain.  Troponins may be elevated in the setting of acute Rhabdo vs. Demand ischemia. EKG negative for acute changes.   - trend troponin I - hold off lowering BP until MRI results - recheck AM EKG - echo as noted above - risk start with lipid panel, TSH, A1c  #Rhabdomyolysis - patient down for roughly 10 hours.  CK 01/02/2004.  LA 4.82 from 5.64 on admission.  No muscular pain.  Denying hematuria. - IVF 120 cc/hr - recheck AM CK  #AKI Cr 1.32, unknown baseline. Likely prerenal in setting of being found down after several hours. - IVF as above - monitor on AM BMP  #HTN - home meds include norvasc and HCTZ. Patient hypertensive in ED. - hold off restarting antihypertensives until stat MRI results  #transaminitis - AST elevated at 140 with ALT 39. Patient endorses daily alcohol use with a beer every day at lunch and a cocktail at dinner - monitor on CIWA  FEN/GI: IVNS 120cc/hr Prophylaxis: SCDs, hold off chemical prophylaxis due to soft tissue head hematoma  Disposition: Admit to telemetry, syncope workup and treatment for Rhabdo  History of Present Illness:  Ian [name] is a 84 y.o. male presenting with rhabdomyolysis and new neuro deficits.  Patient was in his usual state of health until yesterday evening after he cooked dinner when his wife found him lying on the floor on his right side curled around the toilet in the bathroom.  He was responsive, however seems confused.  She checked on him throughout the night because she was unable to pick him up.  He remained somewhat confused but responsive all night long. This morning when he was still unable to stand up she called EMS.  Patient is pleasantly confused  in the emergency department.  He is denying any chest pain or dyspnea.  He feels he may have had some dizziness with standing up too quickly over the last 24 hours but cannot be more specific.  He has no memory of falling, no memory of being on the floor in the bathroom.  He has never had a  seizure in the past.  He has never had a heart attack or stroke in the past.  He is denying congestion, sore throat, cough, dyspnea, nausea/vomiting/diarrhea, or urinary symptoms.  ED course: Initially EMS thought that the patient may have had a stroke due to right sided facial droop and right upper extremity weakness, which was subsequently thought to be dependent facial edema and a right arm injury ED provider per sign out over the phone.  Patient underwent CT head which was negative for acute hemorrhage, however did show soft tissue hematoma and extensive microvascular changes.  Initial labs were notable for lactic acidosis to 5.64 which improved to 4.82.  CK elevated at 5905.  Possible creatinine bump at 1.32 from 0.99 however that baseline is from 7 years ago.  Leukocytosis to 15.6 with a left shift.  Chest x-ray negative.  UDS not drawn.  Alcohol negative.  No acute electrolyte abnormalities.  Review Of Systems: Per HPI with the following additions:   Review of Systems  Constitutional: Positive for fever. Negative for chills, diaphoresis and weight loss.  HENT: Negative for congestion and sore throat.   Gastrointestinal: Negative for abdominal pain, constipation, diarrhea, nausea and vomiting.  Genitourinary: Negative for dysuria and urgency.  Neurological: Negative for speech change, weakness and headaches.    Patient Active Problem List   Diagnosis Date Noted  . Rhabdomyolysis 10/25/2017  . Benign hypertension 04/01/2013  . Decreased potassium in the blood 04/01/2013  . 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 Body mass index (BMI) of 27.0-27.9 in adult 03/31/2013  . Neuropathy 03/31/2013  . Calculus of kidney 03/31/2013    Past Medical History: No past medical history on file.  Past Surgical History: No past surgical history on file.  Social History: Social History   Tobacco Use  . Smoking status: Never Smoker  Substance Use Topics  . Alcohol use: Not on file   . Drug use: Not on file   Additional social history:  Drinks with lunch and dinner every day, beer at lunch, cocktail at dinner  Please also refer to relevant sections of EMR.  Family History: No family history on file. (If not completed, MUST add something in)  Allergies and Medications: No Known Allergies No current facility-administered medications on file prior to encounter.    Current Outpatient Medications on File Prior to Encounter  Medication Sig Dispense Refill  . amLODipine (NORVASC) 10 MG tablet     . aspirin EC 81 MG tablet Take 81 mg by mouth.    . hydrochlorothiazide (HYDRODIURIL) 25 MG tablet     . polyethylene glycol powder (GLYCOLAX/MIRALAX) powder Take 17 g by mouth 2 (two) times daily as needed. 3350 g 0  . ranitidine (ZANTAC) 150 MG tablet Take 1 tablet (150 mg total) by mouth 2 (two) times daily. 60 tablet 0    Objective: BP (!) 160/102 (BP Location: Right Arm)   Pulse 87   Temp 97.7 F (36.5 C) (Oral)   Resp 20   Ht 5\' 11"  (1.803 m)   Wt 160 lb 0.9 oz (72.6 kg)   SpO2 97%   BMI 22.32 kg/m  Exam: General: NAD, pleasant, conversant Eyes: EOMI, PERRLA, no conjunctival pallor or injection ENTM: Mucous membranes dry, no pharyngeal erythema or exudate, + dark dry blood noted on either side of his tongue, around his lips Neck: Supple Cardiovascular: RRR, no M/R/G, no JVD, no lower extremity edema Respiratory: CTA bilaterally, no W/R/G, comfortable work of breathing, speaks in full sentences Gastrointestinal: Soft, nontender, nondistended, no hepatosplenomegaly MSK: No acute deformity Derm: Ecchymosis and age marks noted over upper extremities laterally, + erythema on right side of the face, + mild edema noted over right side of the face Neuro: CN II through XII intact; initially appears as though there is a right facial droop, right ptosis however this seems to be due to edema.  LUE 5/5 strength.  RUE 4-5/5 but difficult to assess due to shoulder pain.  LLE  4/5 strength, RLE patient unable to feel me tapping on his leg and also unable to lift leg against gravity, unable to move toes on the right Psych: Appropriate, AAO x3, intermittently mildly confused  Labs and Imaging: CBC BMET  Recent Labs  Lab 10/25/17 1503 10/25/17 1525  WBC 15.6*  --   HGB 16.7 16.3  HCT 46.8 48.0  PLT 215  --    Recent Labs  Lab 10/25/17 1503 10/25/17 1525  NA 138 139  K 3.7 3.7  CL 101 103  CO2 22  --   BUN 24* 25*  CREATININE 1.32* 1.10  GLUCOSE 186* 181*  CALCIUM 12.0*  --      EKG: RBBB, no acute ST-T changes, NSR, no prior to compare CR 1.32 Lactic acid 5.64>> 4.82 CK 01/02/2004 AST 140  Dg Shoulder Right  Result Date: 10/25/2017 CLINICAL DATA:  Pain after fall. EXAM: RIGHT SHOULDER - 2+ VIEW COMPARISON:  None. FINDINGS: There is no evidence of fracture or dislocation. There is no evidence of arthropathy or other focal bone abnormality. Soft tissues are unremarkable. IMPRESSION: Negative. Electronically Signed   By: Dorise Bullion III M.D   On: 10/25/2017 16:42   Ct Head Wo Contrast  Addendum Date: 10/25/2017   ADDENDUM REPORT: 10/25/2017 18:09 ADDENDUM: Omitted from the original discussion is the presence of multifocal mucosal thickening throughout the paranasal sinuses, most evident in the ethmoid sinuses bilaterally, sphenoid sinus and in the maxillary sinuses (left greater than right). Inspissated secretions are noted in the left maxillary sinus. These findings suggest chronic sinusitis. Electronically Signed   By: Vinnie Langton M.D.   On: 10/25/2017 18:09   Result Date: 10/25/2017 CLINICAL DATA:  84 year old male with history of trauma from a fall to the floor yesterday evening at 10 p.m. Right-sided weakness. EXAM: CT HEAD WITHOUT CONTRAST TECHNIQUE: Contiguous axial images were obtained from the base of the skull through the vertex without intravenous contrast. COMPARISON:  None. FINDINGS: Brain: Moderate cerebral and cerebellar atrophy.  Patchy and confluent areas of decreased attenuation are noted throughout the deep and periventricular white matter of the cerebral hemispheres bilaterally, compatible with chronic microvascular ischemic disease. No evidence of acute infarction, hemorrhage, hydrocephalus, extra-axial collection or mass lesion/mass effect. Vascular: No hyperdense vessel or unexpected calcification. Skull: Normal. Negative for fracture or focal lesion. Sinuses/Orbits: No acute finding. Other: Soft tissue swelling in the scalp near the vertex. IMPRESSION: 1. Soft tissue swelling in the scalp near the vertex, presumably a hematoma. No acute intracranial abnormalities. 2. Moderate cerebral and cerebellar atrophy with extensive chronic microvascular ischemic changes in the cerebral white matter. Electronically Signed: By: Vinnie Langton M.D. On: 10/25/2017  17:01   Dg Chest Port 1 View  Result Date: 10/25/2017 CLINICAL DATA:  Right-sided weakness, right-sided neglect EXAM: PORTABLE CHEST 1 VIEW COMPARISON:  01/25/2010 FINDINGS: There is no focal parenchymal opacity. There is no pleural effusion or pneumothorax. The heart and mediastinal contours are unremarkable. There is mild osteoarthritis of bilateral glenohumeral joints. IMPRESSION: No active disease. Electronically Signed   By: Kathreen Devoid   On: 10/25/2017 15:55   Dg Hips Bilat With Pelvis 3-4 Views  Result Date: 10/25/2017 CLINICAL DATA:  Pain after fall EXAM: DG HIP (WITH OR WITHOUT PELVIS) 3-4V BILAT COMPARISON:  None. FINDINGS: There is no evidence of hip fracture or dislocation. There is no evidence of arthropathy or other focal bone abnormality. IMPRESSION: Negative. Electronically Signed   By: Dorise Bullion III M.D   On: 10/25/2017 16:46    Everrett Coombe, MD 10/25/2017, 8:24 PM PGY-2, [place] Intern pager: [phone], text pages welcome

## 2017-10-25 NOTE — ED Triage Notes (Signed)
Per wife patient found on bathroom floor last night at 2200. Wife went to bed and this AM patient still on floor. This afternoon patient still on floor and wife called EMS. Patient c/o right sided weakness and exhibits right sided neglect. Patient also has black coffee-ground looking particles on his mouth.

## 2017-10-25 NOTE — ED Notes (Signed)
Patient to be taken to CT after X-Ray.

## 2017-10-26 ENCOUNTER — Inpatient Hospital Stay (HOSPITAL_COMMUNITY): Payer: Medicare Other

## 2017-10-26 ENCOUNTER — Encounter (HOSPITAL_COMMUNITY): Payer: Self-pay

## 2017-10-26 DIAGNOSIS — S8010XS Contusion of unspecified lower leg, sequela: Secondary | ICD-10-CM

## 2017-10-26 DIAGNOSIS — I639 Cerebral infarction, unspecified: Secondary | ICD-10-CM | POA: Diagnosis present

## 2017-10-26 DIAGNOSIS — I452 Bifascicular block: Secondary | ICD-10-CM | POA: Diagnosis present

## 2017-10-26 DIAGNOSIS — J323 Chronic sphenoidal sinusitis: Secondary | ICD-10-CM

## 2017-10-26 DIAGNOSIS — I6300 Cerebral infarction due to thrombosis of unspecified precerebral artery: Secondary | ICD-10-CM

## 2017-10-26 DIAGNOSIS — D72829 Elevated white blood cell count, unspecified: Secondary | ICD-10-CM

## 2017-10-26 DIAGNOSIS — S0003XD Contusion of scalp, subsequent encounter: Secondary | ICD-10-CM

## 2017-10-26 DIAGNOSIS — R7401 Elevation of levels of liver transaminase levels: Secondary | ICD-10-CM | POA: Diagnosis present

## 2017-10-26 DIAGNOSIS — R7989 Other specified abnormal findings of blood chemistry: Secondary | ICD-10-CM

## 2017-10-26 DIAGNOSIS — R778 Other specified abnormalities of plasma proteins: Secondary | ICD-10-CM | POA: Diagnosis present

## 2017-10-26 DIAGNOSIS — R55 Syncope and collapse: Secondary | ICD-10-CM

## 2017-10-26 DIAGNOSIS — R4189 Other symptoms and signs involving cognitive functions and awareness: Secondary | ICD-10-CM | POA: Diagnosis present

## 2017-10-26 DIAGNOSIS — R461 Bizarre personal appearance: Secondary | ICD-10-CM | POA: Diagnosis present

## 2017-10-26 DIAGNOSIS — Y92009 Unspecified place in unspecified non-institutional (private) residence as the place of occurrence of the external cause: Secondary | ICD-10-CM

## 2017-10-26 DIAGNOSIS — R634 Abnormal weight loss: Secondary | ICD-10-CM

## 2017-10-26 DIAGNOSIS — J320 Chronic maxillary sinusitis: Secondary | ICD-10-CM

## 2017-10-26 DIAGNOSIS — W19XXXS Unspecified fall, sequela: Secondary | ICD-10-CM

## 2017-10-26 DIAGNOSIS — R42 Dizziness and giddiness: Secondary | ICD-10-CM

## 2017-10-26 DIAGNOSIS — S4991XD Unspecified injury of right shoulder and upper arm, subsequent encounter: Secondary | ICD-10-CM

## 2017-10-26 DIAGNOSIS — S0990XA Unspecified injury of head, initial encounter: Secondary | ICD-10-CM | POA: Diagnosis present

## 2017-10-26 DIAGNOSIS — F05 Delirium due to known physiological condition: Secondary | ICD-10-CM | POA: Diagnosis present

## 2017-10-26 DIAGNOSIS — L8990 Pressure ulcer of unspecified site, unspecified stage: Secondary | ICD-10-CM | POA: Diagnosis present

## 2017-10-26 DIAGNOSIS — J349 Unspecified disorder of nose and nasal sinuses: Secondary | ICD-10-CM | POA: Diagnosis present

## 2017-10-26 DIAGNOSIS — R740 Nonspecific elevation of levels of transaminase and lactic acid dehydrogenase [LDH]: Secondary | ICD-10-CM

## 2017-10-26 DIAGNOSIS — J322 Chronic ethmoidal sinusitis: Secondary | ICD-10-CM | POA: Diagnosis present

## 2017-10-26 HISTORY — DX: Contusion of unspecified lower leg, sequela: S80.10XS

## 2017-10-26 HISTORY — DX: Abnormal weight loss: R63.4

## 2017-10-26 HISTORY — DX: Contusion of scalp, subsequent encounter: S00.03XD

## 2017-10-26 HISTORY — DX: Elevated white blood cell count, unspecified: D72.829

## 2017-10-26 LAB — BASIC METABOLIC PANEL
Anion gap: 10 (ref 5–15)
BUN: 22 mg/dL — ABNORMAL HIGH (ref 6–20)
CO2: 23 mmol/L (ref 22–32)
Calcium: 10.9 mg/dL — ABNORMAL HIGH (ref 8.9–10.3)
Chloride: 105 mmol/L (ref 101–111)
Creatinine, Ser: 1.01 mg/dL (ref 0.61–1.24)
GFR calc Af Amer: >60 mL/min (ref >60)
GFR calc non Af Amer: >60 mL/min (ref >60)
Glucose, Bld: 101 mg/dL — ABNORMAL HIGH (ref 65–99)
Potassium: 2.9 mmol/L — ABNORMAL LOW (ref 3.5–5.1)
Sodium: 138 mmol/L (ref 135–145)

## 2017-10-26 LAB — LIPID PANEL
Cholesterol: 162 mg/dL (ref 0–200)
HDL: 86 mg/dL (ref >40)
LDL Cholesterol: 63 mg/dL (ref 0–99)
Total CHOL/HDL Ratio: 1.9 RATIO
Triglycerides: 66 mg/dL (ref <150)
VLDL: 13 mg/dL (ref 0–40)

## 2017-10-26 LAB — LACTIC ACID, PLASMA: Lactic Acid, Venous: 2.1 mmol/L (ref 0.5–1.9)

## 2017-10-26 LAB — CBC
HCT: 41.4 % (ref 39.0–52.0)
Hemoglobin: 14.5 g/dL (ref 13.0–17.0)
MCH: 34.2 pg — ABNORMAL HIGH (ref 26.0–34.0)
MCHC: 35 g/dL (ref 30.0–36.0)
MCV: 97.6 fL (ref 78.0–100.0)
Platelets: 169 10*3/uL (ref 150–400)
RBC: 4.24 MIL/uL (ref 4.22–5.81)
RDW: 13 % (ref 11.5–15.5)
WBC: 18 10*3/uL — ABNORMAL HIGH (ref 4.0–10.5)

## 2017-10-26 LAB — TROPONIN I
Troponin I: 0.19 ng/mL (ref <0.03)
Troponin I: 0.17 ng/mL (ref <0.03)

## 2017-10-26 LAB — ECHOCARDIOGRAM COMPLETE
Height: 71 in
Weight: 2560.86 oz

## 2017-10-26 LAB — T4, FREE: Free T4: 0.77 ng/dL (ref 0.61–1.12)

## 2017-10-26 LAB — CK: Total CK: 2067 U/L — ABNORMAL HIGH (ref 49–397)

## 2017-10-26 MED ORDER — DEXTROSE-NACL 5-0.9 % IV SOLN
INTRAVENOUS | Status: DC
  Administered 2017-10-26 – 2017-10-27 (×3): via INTRAVENOUS

## 2017-10-26 MED ORDER — LEVOTHYROXINE SODIUM 100 MCG IV SOLR
12.5000 ug | Freq: Every day | INTRAVENOUS | Status: DC
  Administered 2017-10-26: 12.5 ug via INTRAVENOUS
  Filled 2017-10-26 (×2): qty 5

## 2017-10-26 MED ORDER — CLOPIDOGREL BISULFATE 75 MG PO TABS
75.0000 mg | ORAL_TABLET | Freq: Every day | ORAL | Status: DC
  Administered 2017-10-27 – 2017-10-28 (×2): 75 mg via ORAL
  Filled 2017-10-26 (×2): qty 1

## 2017-10-26 MED ORDER — FLUTICASONE PROPIONATE 50 MCG/ACT NA SUSP
2.0000 | Freq: Every day | NASAL | Status: DC
  Administered 2017-10-27 – 2017-10-29 (×3): 2 via NASAL
  Filled 2017-10-26: qty 16

## 2017-10-26 MED ORDER — AMLODIPINE BESYLATE 5 MG PO TABS
5.0000 mg | ORAL_TABLET | Freq: Every day | ORAL | Status: DC
  Administered 2017-10-27 – 2017-10-28 (×2): 5 mg via ORAL
  Filled 2017-10-26 (×2): qty 1

## 2017-10-26 MED ORDER — POTASSIUM CHLORIDE 10 MEQ/100ML IV SOLN
10.0000 meq | INTRAVENOUS | Status: AC
  Administered 2017-10-26 (×4): 10 meq via INTRAVENOUS
  Filled 2017-10-26 (×6): qty 100

## 2017-10-26 MED ORDER — MAGNESIUM SULFATE 2 GM/50ML IV SOLN
2.0000 g | Freq: Once | INTRAVENOUS | Status: AC
  Administered 2017-10-26: 2 g via INTRAVENOUS
  Filled 2017-10-26: qty 50

## 2017-10-26 NOTE — Progress Notes (Signed)
*  PRELIMINARY RESULTS* Echocardiogram 2D Echocardiogram has been performed.  Leavy Cella 10/26/2017, 2:57 PM

## 2017-10-26 NOTE — Progress Notes (Signed)
Family Medicine Teaching Service Daily Progress Note Intern Pager: [phone]  Patient name: Ian [name] Medical record number: [id number] Date of birth: April 07, 1926 Age: 84 y.o. Gender: male  Primary Care Provider: Bernerd Limbo, MD Consultants: Neurology Code Status: Full  Pt Overview and Major Events to Date:  3/2: Admitted for syncope, rhabdomyolysis; brain MRI with "12 mm focus of reduced diffusion in right occipital lobe, probably acute/early subacute infarct"  Assessment and Plan: Ian [name] is a 84 y.o. male presenting with episode of syncope, unwitnessed and subsequent rhabdomyolysis. PMH is significant for hypertension and mild cognitive impairment.   #syncope - uncertain etiology; unwitnessed`fall and patient does not remember the event. Obtained MRI obtained despite negative head CT (aside from chronic microvascular changes) due RLE weakness and confusion worse than baseline. Cardiac etiology also considered. Patient does have a elevated troponin but in setting of rhabdomyolysis.  No T- ST changes noted on initial EKG. No chest pain, dyspnea or history of palpitations and no known cardiac history.  Seizure additionally considered given blood in the patient's mouth.  He has never had a seizure before and if there is no witnessed seizure activity. Low suspicion for infection with negative chest x-ray negative, no skin breakdown and UA with only hemoglobin. - Neurology consult, appreciate recommendations - Frequent neuro checks - Cardiac echo, repeat EKG - PT/OT/SLP - S/p vancomycin and Zosyn x1 in the ED - Continue to monitor for fevers; held off starting empiric antibiotics as we have no source for infection at this time - allow permissive HTN  #Probable acute stroke- infarct of R occipital lobe - Neurology consult - ECHO - carotid US - risk strat labs unremarkable  #AMS - worse than baseline with last Mini Mental status exam score of 21/30 in August 2018. Appears to have new  stroke. Ethanol and UDS negative.  - ordered sitter - ordered mittens, as patient pulled out foley this morning - may need to consider haldol but had prolonged QT on last EKG  #elevated troponin -i-STAT troponin 0.11 in the emergency department.  No chest pain.  Troponins may be elevated in the setting of acute Rapido vs. Demand ischemia. EKG negative for acute changes.   - trend troponin I --> 0.13 > 0.19 > 0.17 - permissive hypertension - recheck EKG - echo as noted above - risk start with lipid panel, TSH, A1c --> A1c 4.8; total cholesterol 162, HDL 86, LDL 63 - low mag; will replete and recheck tomorrow, 3/4  #Rhabdomyolysis - CK improving. Patient down for roughly 10 hours.  CK 01/02/2004.  LA 4.82 from 5.64 on admission.  TTP over R upper arm but right shoulder xray negative for fracture or dislocation.  - Continue IVF 120 cc/hr - recheck AM CK  #AKI Resolved. Cr 1.01 > 1.32, unknown baseline. Likely prerenal in setting of being found down after several hours. - IVF as above - monitor on AM BMP  #HTN - home meds include norvasc and HCTZ. Patient hypertensive in ED but not overnight. - hold unless SBP > 220  #transaminitis - AST elevated at 140 with ALT 39. Patient endorses daily alcohol use with a beer every day at lunch and a cocktail at dinner - monitor on CIWA  FEN/GI: IVNS 120cc/hr Prophylaxis: SCDs, hold off chemical prophylaxis due to soft tissue head hematoma  Disposition: Admit to telemetry, syncope and CVA workup and treatment for Rhabdo  Subjective:  Patient denies pain this morning. He requests to go home. He does not know why he  is still in the hospital. He denies pulling out his foley, but he has blood at urethra and foley with inflated balloon at his side.   Objective: Temp:  [97.7 F (36.5 C)-98.7 F (37.1 C)] 97.9 F (36.6 C) (03/03 0433) Pulse Rate:  [31-93] 93 (03/03 0433) Resp:  [13-32] 18 (03/03 0433) BP: (127-205)/(66-165) 130/66 (03/03  0433) SpO2:  [76 %-100 %] 98 % (03/03 0433) Weight:  [160 lb 0.9 oz (72.6 kg)-176 lb (79.8 kg)] 160 lb 0.9 oz (72.6 kg) (03/02 2000) Physical Exam: General: Resting in bed, watching TV Eyes: EOMI, PERRLA ENTM: Dry MMM, very dry lips Neck: Supple Cardiovascular: RRR, no M/R/G, no JVD, no lower extremity edema Respiratory: CTA bilaterally, comfortable work of breathing Gastrointestinal: Soft, nontender, nondistended, no hepatosplenomegaly MSK: Swelling of R upper extremity that is mildly TTP Derm: Large ecchymosis over R upper arm, + erythema on right side of the face, + mild edema noted over right side of the face Neuro: LUE 5/5 strength.  RUE 4-5/5 limited by shoulder pain. Can hold bilateral lower extremities up against gravity and wiggles toes on command.  Psych: States name and place correctly after initially joking we were in [place] but intermittently says we are at his home rather than the hospital.   Laboratory: Recent Labs  Lab 10/25/17 1503 10/25/17 1525  WBC 15.6*  --   HGB 16.7 16.3  HCT 46.8 48.0  PLT 215  --    Recent Labs  Lab 10/25/17 1503 10/25/17 1525  NA 138 139  K 3.7 3.7  CL 101 103  CO2 22  --   BUN 24* 25*  CREATININE 1.32* 1.10  CALCIUM 12.0*  --   PROT 7.7  --   BILITOT 1.8*  --   ALKPHOS 66  --   ALT 39  --   AST 140*  --   GLUCOSE 186* 181*    Imaging/Diagnostic Tests: Ct Head Wo Contrast  Addendum Date: 10/25/2017   ADDENDUM REPORT: 10/25/2017 18:09 ADDENDUM: Omitted from the original discussion is the presence of multifocal mucosal thickening throughout the paranasal sinuses, most evident in the ethmoid sinuses bilaterally, sphenoid sinus and in the maxillary sinuses (left greater than right). Inspissated secretions are noted in the left maxillary sinus. These findings suggest chronic sinusitis. Electronically Signed   By: Vinnie Langton M.D.   On: 10/25/2017 18:09   Result Date: 10/25/2017 IMPRESSION: 1. Soft tissue swelling in the scalp  near the vertex, presumably a hematoma. No acute intracranial abnormalities. 2. Moderate cerebral and cerebellar atrophy with extensive chronic microvascular ischemic changes in the cerebral white matter. Electronically Signed: By: Vinnie Langton M.D. On: 10/25/2017 17:01   Mr Jeri Cos ER Contrast  Result Date: 10/25/2017 IMPRESSION: 1. 12 mm focus of reduced diffusion in right occipital lobe, probably acute/early subacute infarct. No hemorrhage or mass effect. 2. Thin smooth diffuse pachymeningeal thickening without nodularity. This can be seen in the setting of head trauma or meningitis. No additional findings for differential considerations of intracranial hypotension, mass infiltration, granulomatous disease, or hypertrophic pachymeningitis. 3. Moderate chronic microvascular ischemic changes and moderate parenchymal volume loss of the brain. 4. Extensive paranasal sinus disease with fluid levels which may represent acute sinusitis. These results will be called to the ordering clinician or representative by the Radiologist Assistant, and communication documented in the PACS or zVision Dashboard. Electronically Signed   By: Kristine Garbe M.D.   On: 10/25/2017 22:43    Rogue Bussing, MD 10/26/2017, 8:11 AM  PGY-3, [facility] Intern pager: [phone], text pages welcome

## 2017-10-26 NOTE — Evaluation (Signed)
Clinical/Bedside Swallow Evaluation Patient Details  Name: Ian [name] MRN: [id number] Date of Birth: December 21, 1925  Today's Date: 10/26/2017 Time: SLP Start Time (ACUTE ONLY): 7 SLP Stop Time (ACUTE ONLY): 1016 SLP Time Calculation (min) (ACUTE ONLY): 11 min  Past Medical History:  Past Medical History:  Diagnosis Date  . 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 Calculus of kidney 03/31/2013   Overview:  IMPRESSION: see ER note   . Leukocytosis 10/26/2017  . Loss of weight 10/26/2017  . Multiple leg contusions, unspecified laterality, sequela 10/26/2017  . Scalp hematoma, subsequent encounter 10/26/2017   Past Surgical History: History reviewed. No pertinent surgical history. HPI:  Ian [name] a 84 y.o.malepresenting with episode of syncope, unwitnessed and subsequent rhabdomyolysis. PMH is significant forhypertension. Consider stroke given new neurologic changes with RLEweakness and confusion worse than baseline. CT head negative for acute hemorrhage,though positive for extensive chronic microvascular ischemic changes, and soft tissue hematoma. MRI W/WO contrast pending. Cardiac etiology also considered. Patient does have a elevated troponin which may be in the setting of rhabdomyolysis. No T- STchanges noted on EKG. No chest pain, dyspneaor history of palpitations and no known cardiac history. Seizure additionally considered given blood in the patient's mouth. He has never had a seizure before and if there is no witnessed seizure activity. Additionally infection must be considered as cause of syncope. Chest x-ray negative. Leukocytosis at 15.6. UA pending, no skin breakdown source of possible infection. MRI revealed 12 mm focus of reduced diffusion in right occipital lobe, probably acute/early subacute infarct. No hemorrhage or mass effect. Moderate chronic microvascular ischemic changes and moderate parenchymal volume loss of the brain   Assessment / Plan /  Recommendation Clinical Impression  Pt presents with severe confusion and resultant agitation. SLP evaluated in conjunction with nursing to aid in desculation of any aggressive behaviors. Pt with inability to redirect from aggression and confusion regarding pain from earlier self-removal of catheter. Pt given cup to hold with inability to place straw at mouth (d/t confusion/encephalopathy). He removed straw and consumed via cup sips without overt s/s of aspiration. Further evaluation of oropharyngeal abilities was limited by increased aggression and agitation. Therefore recommend full liquid diet as cognition allows. ST to follow for further diet advancement as encephalopathy clears. Nursing voiced understanding.  SLP Visit Diagnosis: Dysphagia, unspecified (R13.10)    Aspiration Risk  Moderate aspiration risk;Risk for inadequate nutrition/hydration(d/t encephalopathy)    Diet Recommendation (full liquid diet)   Liquid Administration via: Cup Medication Administration: Whole meds with liquid Supervision: Full supervision/cueing for compensatory strategies Postural Changes: Seated upright at 90 degrees    Other  Recommendations Oral Care Recommendations: Oral care QID   Follow up Recommendations (TBD)      Frequency and Duration min 2x/week  2 weeks       Prognosis Prognosis for Safe Diet Advancement: Fair Barriers to Reach Goals: Cognitive deficits;Severity of deficits;Behavior      Swallow Study   General Date of Onset: 10/25/17 HPI: Ian [name] a 84 y.o.malepresenting with episode of syncope, unwitnessed and subsequent rhabdomyolysis. PMH is significant forhypertension. Consider stroke given new neurologic changes with RLEweakness and confusion worse than baseline. CT head negative for acute hemorrhage,though positive for extensive chronic microvascular ischemic changes, and soft tissue hematoma. MRI W/WO contrast pending. Cardiac etiology also considered. Patient does  have a elevated troponin which may be in the setting of rhabdomyolysis. No T- STchanges noted on EKG. No chest pain, dyspneaor history of palpitations and no known cardiac history. Seizure  additionally considered given blood in the patient's mouth. He has never had a seizure before and if there is no witnessed seizure activity. Additionally infection must be considered as cause of syncope. Chest x-ray negative. Leukocytosis at 15.6. UA pending, no skin breakdown source of possible infection. MRI revealed 12 mm focus of reduced diffusion in right occipital lobe, probably acute/early subacute infarct. No hemorrhage or mass effect. Moderate chronic microvascular ischemic changes and moderate parenchymal volume loss of the brain Type of Study: Bedside Swallow Evaluation Previous Swallow Assessment: none in chart Diet Prior to this Study: NPO Temperature Spikes Noted: No Respiratory Status: Room air History of Recent Intubation: No Behavior/Cognition: Agitated;Confused Oral Cavity Assessment: Dry Oral Care Completed by SLP: No Oral Cavity - Dentition: Adequate natural dentition Vision: Impaired for self-feeding Self-Feeding Abilities: Needs assist(but is resistive) Patient Positioning: Upright in bed Baseline Vocal Quality: Normal Volitional Cough: Cognitively unable to elicit Volitional Swallow: Unable to elicit    Oral/Motor/Sensory Function     Ice Chips     Thin Liquid Thin Liquid: Within functional limits Presentation: Cup;Self Fed    Nectar Thick Nectar Thick Liquid: Not tested   Honey Thick Honey Thick Liquid: Not tested   Puree Puree: Not tested   Solid   GO   Solid: Not tested        Ian [name] 10/26/2017,10:33 AM

## 2017-10-26 NOTE — Progress Notes (Signed)
Notified MD about this am abnormal 12 lead EKG. No new orders given.

## 2017-10-26 NOTE — Progress Notes (Signed)
CRITICAL VALUE ALERT  Critical Value:  Lactic acid 2.1  Date & Time Notied:  0135  Provider Notified: Verner Mould  Orders Received/Actions taken: orthostatic vital signs discontinued

## 2017-10-26 NOTE — Progress Notes (Signed)
PT Cancellation Note  Patient Details Name: Ian [name] MRN: [id number] DOB: 06-Jul-1926   Cancelled Treatment:    Reason Eval/Treat Not Completed: Medical issues which prohibited therapy. Pt agitated and combative. Will re-attempt eval at later date/time when pt able to better participate.   Lorriane Shire 10/26/2017, 12:47 PM

## 2017-10-26 NOTE — Progress Notes (Signed)
OT Cancellation Note  Patient Details Name: Ian [name] MRN: [id number] DOB: 1926/08/15   Cancelled Treatment:    Reason Eval/Treat Not Completed: Medical issues which prohibited therapy. Pt agitated and combative. Will re-attempt eval at later date/time when pt able to better participate.    Hortencia Pilar 10/26/2017, 12:55 PM

## 2017-10-26 NOTE — Consult Note (Addendum)
NEUROHOSPITALISTS - CONSULTATION NOTE   Requesting Physician: Dr. Olene Floss [facility]: Dr. Lorraine Lax  Admit date: 10/25/2017    Chief Complaint: dizziness and right side weakness  History obtained from:  Patient and Chart and spouse interview  HPI   Ian [name] is an 84 y.o. male  HTN admitted with a reported fall, possible syncopal event at home, then laying multiple hours (approximately 16 hours) laying on floor. Presented to hospital with Rhado, Leukocytosis and Multiple electrolyte and thyroid abnormalities. MRI reveals an acute Right occipital lobe infarct. Patient is complaining of dizziness and right sided weakness.  Wife states that at baseline, the patient has had "dementia" for the past few years. He has never had a formal Neurological evaluation for this. She states that he drinks at least 3-4 times per day, with his meals and in the evenings. His baseline personality, according to the wife, is very impatient and Type A personality. "He always likes to be in charge".  When he initially feel in the bathroom the wife attempted to help him up but the patient refused. She then went to bed and allowed him to lay on the floor until later the following day. Excess alcohol intake and family dynamics may have played a role in this unusual explanation.   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  Past Surgical History: He  has no past surgical history on file.  Family History:  has no family status information on file.   Social  History:  He  reports that  has never smoked. he has never used smokeless tobacco. He reports that he does not use drugs. His alcohol history is not on file.  Allergies:  No Known Allergies  Medications:  Current Facility-Administered Medications:  .  ALPRAZolam (XANAX) tablet 0.25 mg, 0.25 mg, Oral, PRN, Everrett Coombe, MD .  amLODipine (NORVASC) tablet 5 mg, 5 mg, Oral, Daily, McDiarmid, Blane Ohara, MD .  clopidogrel (PLAVIX) tablet 75 mg, 75 mg, Oral, Daily, McDiarmid, Sherren Mocha D, MD .  dextrose 5 %-0.9 % sodium chloride infusion, , Intravenous, Continuous, McDiarmid, Blane Ohara, MD .  Derrill Memo ON 10/27/2017] fluticasone (FLONASE) 50 MCG/ACT nasal spray 2 spray, 2 spray, Each Nare, Daily, McDiarmid, Blane Ohara, MD .  heparin injection 5,000 Units, 5,000 Units, Subcutaneous, Q8H, Verner Mould, MD, 5,000 Units at 10/26/17 [phone] .  magnesium sulfate IVPB 2 g 50 mL, 2 g, Intravenous, Once, Fitzgerald, Sharman Cheek, MD .  nitroGLYCERIN (NITROSTAT) SL tablet 0.4 mg, 0.4 mg, Sublingual, Q5 Min x 3 PRN, Everrett Coombe, MD .  ondansetron [facility]) injection 4 mg, 4 mg, Intravenous, Q6H PRN, Everrett Coombe, MD .  potassium chloride 10 mEq in 100 mL IVPB, 10 mEq, Intravenous, Q1 Hr x 6, Costello, Mary A, NP  ROS: History obtained from spouse and patient  General ROS: negative for - chills, fatigue, fever, night sweats, weight gain or weight loss Psychological ROS: positive for - behavioral disorder, memory difficulties, mood swings  Ophthalmic ROS: negative for - blurry vision, double vision, eye pain or loss of vision ENT ROS: negative for - epistaxis, nasal discharge, oral lesions, sore throat, tinnitus or vertigo Allergy and Immunology  ROS: negative for - hives or itchy/watery eyes Hematological and Lymphatic ROS: negative for - bleeding problems, bruising or swollen lymph nodes Endocrine ROS: negative for - galactorrhea, hair pattern changes, polydipsia/polyuria or temperature intolerance Respiratory ROS:  positive for - occasional productive cough with thick mucus Cardiovascular ROS: negative for - chest pain, dyspnea on exertion, edema or irregular heartbeat Gastrointestinal ROS: negative for - abdominal pain, diarrhea, hematemesis, nausea/vomiting or stool incontinence Genito-Urinary ROS: positive for - dysuria, hematuria,  Musculoskeletal ROS: positive for - muscular weakness on right side Neurological ROS: as noted in HPI Dermatological ROS: positive for abrasions/bruises on extremities  Physical Examination: Vitals:   10/25/17 2025 10/26/17 0003 10/26/17 0200 10/26/17 0433  BP: (!) 160/102 130/84 135/85 130/66  Pulse: 87 93 92 93  Resp: 20 20 18 18   Temp: 97.7 F (36.5 C) 97.8 F (36.6 C) 97.9 F (36.6 C) 97.9 F (36.6 C)  TempSrc: Oral Oral Oral Oral  SpO2:  100%  98%  Weight:      Height:       HEENT-  Normocephalic,  Cardiovascular - Regular rate and rhythm  Respiratory - Lungs clear bilaterally. Non-labored breathing, No wheezing. Abdomen - soft and non-tender, BS normal Extremities- no edema or cyanosis Skin-warm and dry, multiple bruises and skin abrasions on extremities, right side worse than left  Neurological Examination Mental Status: Alert, oriented to self, wife, city, state, year. He frequently speaks off topic and is easily distracted. Suspect underlying undiagnosed dementia at baseline.  Speech fluent without evidence of aphasia, dysarthria. No neglect.  Able to follow 3 step commands without difficulty, with repeated requests Cranial Nerves: II: Visual Fields are full. Pupils are equal, round, and reactive to light.   III,IV, VI: EOMI without ptosis or diploplia. Rotational and horizontal nystagmus.   V: Facial sensation is symmetric to temperature VII: Facial movement is symmetric.  VIII: hearing is intact to voice X: Uvula elevates symmetrically XI: Shoulder shrug is symmetric. XII: tongue is midline without atrophy or fasciculations.  Motor: Tone is  normal. Bulk is normal. 5/5 strength was present in all four extremities. Strength testing is limited due to pain on Right, but patient is able to MAE's will good effort with repeated testing. Sensor: Sensation is symmetric to light touch and temperature in the arms and legs. Deep Tendon Reflexes: 2+ and symmetric throughout in the biceps and patellae Plantars: Toes are downgoing bilaterally.  Cerebellar: normal finger-to-nose bilaterally, again with repeated testing and support Gait: not tested   Lab Results: CBC: Recent Labs  Lab 10/25/17 1503 10/25/17 1525 10/26/17 0801  WBC 15.6*  --  18.0*  HGB 16.7 16.3 14.5  HCT 46.8 48.0 41.4  MCV 97.1  --  97.6  PLT 215  --  517   Basic Metabolic Panel: Recent Labs  Lab 10/25/17 1503 10/25/17 1525 10/25/17 1921 10/26/17 0801  NA 138 139  --  138  K 3.7 3.7  --  2.9*  CL 101 103  --  105  CO2 22  --   --  23  GLUCOSE 186* 181*  --  101*  BUN 24* 25*  --  22*  CREATININE 1.32* 1.10  --  1.01  CALCIUM 12.0*  --   --  10.9*  MG  --   --  1.1*  --    Liver Function Tests: Recent Labs  Lab 10/25/17 1503  AST 140*  ALT 39  ALKPHOS 66  BILITOT 1.8*  PROT 7.7  ALBUMIN 3.7  No results for input(s): LIPASE, AMYLASE in the last 168 hours. No results for input(s): AMMONIA in the last 168 hours. Thyroid Function Studies: Recent Labs    10/25/17 1503  TSH 9.181*   Cardiac Enzymes: Recent Labs  Lab 10/25/17 1503 10/25/17 1921 10/25/17 2345 10/26/17 0801  CKTOTAL 5,905*  --   --  2,067*  TROPONINI  --  0.13* 0.19* 0.17*   Coagulation Studies: Recent Labs    10/25/17 1503  APTT 26  INR 1.06   No results for input(s): DDIMER in the last 72 hours. [place] Work -Up No results for input(s): VITAMINB12, FOLATE, IRON, RETICCTPCT in the last 72 hours. Lipid Panel: Recent Labs  Lab 10/25/17 2345  CHOL 162  TRIG 66  HDL 86  CHOLHDL 1.9  VLDL 13  LDLCALC 63   CBG & HgbA1C: Recent Labs  Lab 10/25/17 1501  GLUCAP  180*   Recent Labs    10/25/17 1908  HGBA1C 4.8   Sepsis Labs: Recent Labs  Lab 10/25/17 1503 10/26/17 0801  WBC 15.6* 18.0*   Microbiology: No results found for this or any previous visit (from the past 240 hour(s)). Urinalysis:  Recent Labs  Lab 10/25/17 1513  COLORURINE AMBER*  APPEARANCEUR CLOUDY*  LABSPEC 1.024  PHURINE 5.0  GLUCOSEU NEGATIVE  HGBUR MODERATE*  BILIRUBINUR NEGATIVE  KETONESUR 5*  PROTEINUR 100*  NITRITE NEGATIVE  LEUKOCYTESUR NEGATIVE   Urine Drug Screen:      Component Value Date/Time   LABOPIA NONE DETECTED 10/25/2017 1513   COCAINSCRNUR NONE DETECTED 10/25/2017 1513   LABBENZ NONE DETECTED 10/25/2017 1513   AMPHETMU NONE DETECTED 10/25/2017 1513   THCU NONE DETECTED 10/25/2017 1513   LABBARB NONE DETECTED 10/25/2017 1513    Alcohol Level:  Recent Labs  Lab 10/25/17 1503  ETH <10   Imaging: Dg Shoulder Right  Result Date: 10/25/2017 CLINICAL DATA:  Pain after fall. EXAM: RIGHT SHOULDER - 2+ VIEW COMPARISON:  None. FINDINGS: There is no evidence of fracture or dislocation. There is no evidence of arthropathy or other focal bone abnormality. Soft tissues are unremarkable. IMPRESSION: Negative. Electronically Signed   By: Dorise Bullion III M.D   On: 10/25/2017 16:42   Ct Head Wo Contrast  Addendum Date: 10/25/2017   ADDENDUM REPORT: 10/25/2017 18:09 ADDENDUM: Omitted from the original discussion is the presence of multifocal mucosal thickening throughout the paranasal sinuses, most evident in the ethmoid sinuses bilaterally, sphenoid sinus and in the maxillary sinuses (left greater than right). Inspissated secretions are noted in the left maxillary sinus. These findings suggest chronic sinusitis. Electronically Signed   By: Vinnie Langton M.D.   On: 10/25/2017 18:09   Result Date: 10/25/2017 CLINICAL DATA:  84 year old male with history of trauma from a fall to the floor yesterday evening at 10 p.m. Right-sided weakness. EXAM: CT HEAD  WITHOUT CONTRAST TECHNIQUE: Contiguous axial images were obtained from the base of the skull through the vertex without intravenous contrast. COMPARISON:  None. FINDINGS: Brain: Moderate cerebral and cerebellar atrophy. Patchy and confluent areas of decreased attenuation are noted throughout the deep and periventricular white matter of the cerebral hemispheres bilaterally, compatible with chronic microvascular ischemic disease. No evidence of acute infarction, hemorrhage, hydrocephalus, extra-axial collection or mass lesion/mass effect. Vascular: No hyperdense vessel or unexpected calcification. Skull: Normal. Negative for fracture or focal lesion. Sinuses/Orbits: No acute finding. Other: Soft tissue swelling in the scalp near the vertex. IMPRESSION: 1. Soft tissue swelling in the scalp near the vertex, presumably a hematoma. No  acute intracranial abnormalities. 2. Moderate cerebral and cerebellar atrophy with extensive chronic microvascular ischemic changes in the cerebral white matter. Electronically Signed: By: Vinnie Langton M.D. On: 10/25/2017 17:01   Mr Jeri Cos UV Contrast  Result Date: 10/25/2017 CLINICAL DATA:  84 y/o M; found on bathroom floor last night at 2200 hours. Right-sided weakness and right-sided neglect. EXAM: MRI HEAD WITHOUT AND WITH CONTRAST TECHNIQUE: Multiplanar, multiecho pulse sequences of the brain and surrounding structures were obtained without and with intravenous contrast. CONTRAST:  44mL MULTIHANCE GADOBENATE DIMEGLUMINE 529 MG/ML IV SOLN COMPARISON:  10/25/2017 CT head FINDINGS: Brain: 12 mm focus of reduced diffusion within the right occipital lobe (series 2, image 25 and series 5, image 2) compatible with acute/early subacute infarction. No hemorrhage or mass effect. Patchy confluentnonspecific foci of T2 FLAIR hyperintense signal abnormality in subcortical and periventricular white matter are compatible withmoderatechronic microvascular ischemic changes for age. Small chronic  lacunar infarct within the left thalamus. Moderatebrain parenchymal volume loss. Motion degraded SWI sequence, no gross hemorrhage on GRE sequence. After administration of intravenous contrast there is thin diffuse smooth pachymeningeal enhancement. No brain parenchymal enhancement. Vascular: Normal flow voids. Skull and upper cervical spine: Soft tissue pannus surrounding odontoid process with mild cervical spinal canal stenosis may be degenerative, due to rheumatoid disease, or crystal deposition disease. Mild scalp soft tissue thickening over the vertex may represent a contusion. No calvarial bone marrow edema. Sinuses/Orbits: Sphenoid, left ethmoid, and left mastoid opacification, fluid levels, and diffuse paranasal sinus mucosal thickening. No abnormal signal of mastoid air cells. Orbits are unremarkable. Other: None. IMPRESSION: 1. 12 mm focus of reduced diffusion in right occipital lobe, probably acute/early subacute infarct. No hemorrhage or mass effect. 2. Thin smooth diffuse pachymeningeal thickening without nodularity. This can be seen in the setting of head trauma or meningitis. No additional findings for differential considerations of intracranial hypotension, mass infiltration, granulomatous disease, or hypertrophic pachymeningitis. 3. Moderate chronic microvascular ischemic changes and moderate parenchymal volume loss of the brain. 4. Extensive paranasal sinus disease with fluid levels which may represent acute sinusitis. These results will be called to the ordering clinician or representative by the Radiologist Assistant, and communication documented in the PACS or zVision Dashboard. Electronically Signed   By: Kristine Garbe M.D.   On: 10/25/2017 22:43   Dg Chest Port 1 View  Result Date: 10/25/2017 CLINICAL DATA:  Right-sided weakness, right-sided neglect EXAM: PORTABLE CHEST 1 VIEW COMPARISON:  01/25/2010 FINDINGS: There is no focal parenchymal opacity. There is no pleural effusion or  pneumothorax. The heart and mediastinal contours are unremarkable. There is mild osteoarthritis of bilateral glenohumeral joints. IMPRESSION: No active disease. Electronically Signed   By: Kathreen Devoid   On: 10/25/2017 15:55   Dg Hips Bilat With Pelvis 3-4 Views  Result Date: 10/25/2017 CLINICAL DATA:  Pain after fall EXAM: DG HIP (WITH OR WITHOUT PELVIS) 3-4V BILAT COMPARISON:  None. FINDINGS: There is no evidence of hip fracture or dislocation. There is no evidence of arthropathy or other focal bone abnormality. IMPRESSION: Negative. Electronically Signed   By: Dorise Bullion III M.D   On: 10/25/2017 16:46     IMPRESSION: [name] is a 84 y.o. male with PMH of HTN admitted with a reported fall, possible syncopal event at home, then laying multiple hours laying on floor. Presented to hospital with Rhado, Leukocytosis and Multiple electrolyte and thyroid abnormalities. MRI reveals:   Acute 84mm Right Occipital lobe infarct:  Suspected Etiology: small vessel disease vs cardioembolic  Resultant Symptoms: Dizziness Stroke Risk Factors: hypertension Other Stroke Risk Factors: Advanced age, ETOH use  Patient is complaining of Right sided weakness and dizziness. His right sided weakness is likely related to the fact that he laid on his right side for over 16 hours. The right sided weakness is not related to his current stroke. His dizziness is likely multifactorial.  Outstanding Stroke Work-up Studies:     Echocardiogram:                                                    PENDING B/L Carotid U/S:                                                     PENDING  PLAN  10/26/2017: Frequent Neurochecks  Telemetry Monitoring NPO until passes Stroke swallow screen Continue Plavix, Classifiable as having failed ASA HOLD Statin, at LDL goal and elevated LFT's on admission HgbA1C and Lipid Profile PT/OT/SLP Consult PM & Rehab Consult Case Management /MSW Ongoing aggressive stroke risk factor  management Patient will be counseled to be compliant with his antithrombotic medications Patient will be counseled on Lifestyle modifications including, Diet, Exercise, and Stress Follow up with [facility] in 6 weeks  MEDICAL ISSUES: per primary team Acute Encephalopathy -  likely secondary to electrolyte and thyroid abnormalities, Rhabdomyolysis and Leukocytosis Mentation has improved since admission. Suspect will continue to slowly improve.  HYPERTENSION: Stable,   Permissive hypertension (OK if <220/120) for 24-48 hours post stroke and then gradually normalized within 5-7 days. Long term BP goal normotensive. May slowly restart home B/P medications after 48 hours Home Meds: Norvasc, HCTZ  HYPERLIPIDEMIA:    Component Value Date/Time   CHOL 162 10/25/2017 2345   TRIG 66 10/25/2017 2345   HDL 86 10/25/2017 2345   CHOLHDL 1.9 10/25/2017 2345   VLDL 13 10/25/2017 2345   LDLCALC 63 10/25/2017 2345  Home Meds:  NONE LDL  goal < 70, at goal No need to start statin at discharge, Elevated LTF's on admission  R/O DIABETES: Lab Results  Component Value Date   HGBA1C 4.8 10/25/2017  HgbA1c goal < 7.0  Likely ETOH ABUSE CIWA Protocol  Other Active Problems: Principal Problem:   Cerebral infarction ([place]), right occipital lobe Active Problems:   Benign hypertension   Rhabdomyolysis   Pressure injury of skin   Loss of weight   Elevated troponin I level   Leukocytosis   Disheveled appearance   Multiple leg contusions, unspecified laterality, sequela   Cognitive impairment   Scalp hematoma, subsequent encounter   Head injury   Fall at home, sequela   Right BBB/left ant fasc block   Acute confusional state   Dizziness and giddiness   Elevated AST (SGOT)   Shoulder injury, right, subsequent encounter   Paranasal sinus disease   Maxillary sinusitis   Ethmoid sinusitis   Sphenoid sinusitis    Hospital day # 1 VTE prophylaxis: Heparin  Diet - Fall  precautions Diet full liquid Room service appropriate? Yes; Fluid consistency: Thin   FAMILY UPDATES:  family at bedside  TEAM UPDATES:McDiarmid, Blane Ohara, MD   Prior Home Stroke Medications:  aspirin 81 mg daily  Discharge Stroke Meds:  Please discharge patient on clopidogrel 75 mg daily   Disposition: Final discharge disposition not confirmed Therapy Recs:               PENDING Follow Up:  Bernerd Limbo, MD -PCP Follow up in 1-2 weeks      Assessment and plan discussed with with attending physician and they are in agreement.    Mary Sella, ANP-C [facility] 10/26/2017, 11:18 AM  10/26/2017 ATTENDING ASSESSMENT:    Patient had small occipital infarct. Needs cardiac eval and monitoring for Afib. Less encephalopathic this evening and  likely metabolic. ASA + Plavix for 3 weeks and Plavix alone.        Please page stroke NP  Or  PA  Or MD from 8am -4 pm  as this patient from this time will be  followed by the stroke.   You can look them up on [email or web address]  Password [email or web address]

## 2017-10-27 ENCOUNTER — Inpatient Hospital Stay (HOSPITAL_COMMUNITY): Payer: Medicare Other

## 2017-10-27 ENCOUNTER — Encounter (HOSPITAL_COMMUNITY): Payer: Self-pay | Admitting: Radiology

## 2017-10-27 DIAGNOSIS — W19XXXS Unspecified fall, sequela: Secondary | ICD-10-CM

## 2017-10-27 DIAGNOSIS — I519 Heart disease, unspecified: Secondary | ICD-10-CM

## 2017-10-27 DIAGNOSIS — E039 Hypothyroidism, unspecified: Secondary | ICD-10-CM

## 2017-10-27 DIAGNOSIS — E876 Hypokalemia: Secondary | ICD-10-CM

## 2017-10-27 DIAGNOSIS — I10 Essential (primary) hypertension: Secondary | ICD-10-CM

## 2017-10-27 DIAGNOSIS — R461 Bizarre personal appearance: Secondary | ICD-10-CM

## 2017-10-27 DIAGNOSIS — Y92009 Unspecified place in unspecified non-institutional (private) residence as the place of occurrence of the external cause: Secondary | ICD-10-CM

## 2017-10-27 DIAGNOSIS — I6300 Cerebral infarction due to thrombosis of unspecified precerebral artery: Secondary | ICD-10-CM

## 2017-10-27 DIAGNOSIS — D72829 Elevated white blood cell count, unspecified: Secondary | ICD-10-CM

## 2017-10-27 DIAGNOSIS — R4182 Altered mental status, unspecified: Secondary | ICD-10-CM

## 2017-10-27 DIAGNOSIS — F05 Delirium due to known physiological condition: Secondary | ICD-10-CM

## 2017-10-27 DIAGNOSIS — R4189 Other symptoms and signs involving cognitive functions and awareness: Secondary | ICD-10-CM

## 2017-10-27 DIAGNOSIS — S8010XS Contusion of unspecified lower leg, sequela: Secondary | ICD-10-CM

## 2017-10-27 DIAGNOSIS — I5189 Other ill-defined heart diseases: Secondary | ICD-10-CM

## 2017-10-27 DIAGNOSIS — T796XXS Traumatic ischemia of muscle, sequela: Secondary | ICD-10-CM

## 2017-10-27 LAB — BASIC METABOLIC PANEL
Anion gap: 11 (ref 5–15)
BUN: 14 mg/dL (ref 6–20)
CO2: 23 mmol/L (ref 22–32)
Calcium: 10.2 mg/dL (ref 8.9–10.3)
Chloride: 104 mmol/L (ref 101–111)
Creatinine, Ser: 0.86 mg/dL (ref 0.61–1.24)
GFR calc Af Amer: >60 mL/min (ref >60)
GFR calc non Af Amer: >60 mL/min (ref >60)
Glucose, Bld: 116 mg/dL — ABNORMAL HIGH (ref 65–99)
Potassium: 3.2 mmol/L — ABNORMAL LOW (ref 3.5–5.1)
Sodium: 138 mmol/L (ref 135–145)

## 2017-10-27 LAB — MAGNESIUM: Magnesium: 1.2 mg/dL — ABNORMAL LOW (ref 1.7–2.4)

## 2017-10-27 LAB — CBC
HCT: 40.4 % (ref 39.0–52.0)
Hemoglobin: 13.6 g/dL (ref 13.0–17.0)
MCH: 33.6 pg (ref 26.0–34.0)
MCHC: 33.7 g/dL (ref 30.0–36.0)
MCV: 99.8 fL (ref 78.0–100.0)
Platelets: 178 10*3/uL (ref 150–400)
RBC: 4.05 MIL/uL — ABNORMAL LOW (ref 4.22–5.81)
RDW: 13.3 % (ref 11.5–15.5)
WBC: 14.2 10*3/uL — ABNORMAL HIGH (ref 4.0–10.5)

## 2017-10-27 LAB — CK: Total CK: 825 U/L — ABNORMAL HIGH (ref 49–397)

## 2017-10-27 MED ORDER — POTASSIUM CHLORIDE CRYS ER 20 MEQ PO TBCR
40.0000 meq | EXTENDED_RELEASE_TABLET | ORAL | Status: AC
  Administered 2017-10-27 (×2): 40 meq via ORAL
  Filled 2017-10-27: qty 2

## 2017-10-27 MED ORDER — IOPAMIDOL (ISOVUE-370) INJECTION 76%
INTRAVENOUS | Status: AC
  Administered 2017-10-27: 50 mL
  Filled 2017-10-27: qty 50

## 2017-10-27 MED ORDER — ATORVASTATIN CALCIUM 40 MG PO TABS
40.0000 mg | ORAL_TABLET | Freq: Every day | ORAL | Status: DC
  Administered 2017-10-27 – 2017-10-28 (×2): 40 mg via ORAL
  Filled 2017-10-27 (×2): qty 1

## 2017-10-27 MED ORDER — ASPIRIN EC 81 MG PO TBEC
81.0000 mg | DELAYED_RELEASE_TABLET | Freq: Every day | ORAL | Status: DC
  Administered 2017-10-27 – 2017-10-28 (×2): 81 mg via ORAL
  Filled 2017-10-27 (×2): qty 1

## 2017-10-27 MED ORDER — LEVOTHYROXINE SODIUM 25 MCG PO TABS
25.0000 ug | ORAL_TABLET | Freq: Every day | ORAL | Status: DC
  Administered 2017-10-28 – 2017-10-29 (×2): 25 ug via ORAL
  Filled 2017-10-27 (×2): qty 1

## 2017-10-27 NOTE — Discharge Summary (Addendum)
[facility] Discharge Summary  Patient name: Ian [name] Medical record number: [id number] Date of birth: 03/23/1926 Age: 84 y.o. 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 Bifrontal subarachnoid hemorrhage/tiny subdural  AMS-improving Elevated troponin-stable Rhabdomyolysis-improving  AKI-resolved  HTN Transaminitis  Hypothyroidism  Aneurysmal ascending aorta Thyroid nodule   Disposition: CIR  Discharge Condition: stable, improving   Discharge Exam:  General: awake and alert, laying in bed, eyes closed during entire exam  Cardiovascular: RRR, no MRG  Respiratory: CTAB, no wheezes, rales, or rhonchi  Abdomen: soft, non tender, non distended, bowel sounds normal  Extremities: no edema, non tender Neuro: would not participate in exam  Brief Hospital Course:  Ian [name] is a 84 y.o. male presenting after unwitnessed syncopal episode. Patient was found down after 24 hours and found to have rhabdomyolysis on admission. Syncopal work up revealed acute/subactue infarct in the right occipital lobe. Neurology was consulted and recommended ASA+plavix for 3 weeks and then plavix alone. On CTA it was seen that patient had minimal bifrontal subarachnoid hemorrhage and possible tiny subdural. ASA/plavix were held and repeat CT head on 3/6 showed resolution of subarachnoid and subdural hemorrhage and no intracranial hemorrhage now identified with no other new acute intracranial process.   Seizure was ruled out with EEG. Echo showing LVEF 65-70%, G1DD, with no regional wall motion abnormalities  During admission troponins were elevated and trended at 0.13>0.19>0.17, likely 2/2 acute rhabdo vs.  Demand ischemia.   Given patient was down for 24 hours patient was in rhabdomyolysis on admission with CK of 5905, which trended down to [phone]. CK improved with increased fluid hydration.   Incidental aneurysmal ascending aorta found on CTA neck. CVTS was consulted and recommended outpatient follow up, stated they would call patient to inform of date of appointment.   Incidental thyroid nodule found on CTA neck, recommended for [place] as outpatient.   Issues for Follow Up:  1. Patient needs thyroid US for complex thyroid nodule 2. Patient will need CTA or MRA for ascending aortic aneurysm. 3. Follow up with cardiology for loop recorder placement  4. Follow up with CVTS for aortic aneurysm  5. Begin aspirin and plavix on 3/7. Will need dual therapy for 3 weeks and then plavix alone 6. Monitor BP, discontinued HCTZ during admission   Significant Procedures: None  Significant Labs and Imaging:  Recent Labs  Lab 10/27/17 1146 10/28/17 0929 10/29/17 0611  WBC 14.2* 11.0* 9.8  HGB 13.6 12.9* 11.9*  HCT 40.4 38.4* 34.5*  PLT 178 170 180   Recent Labs  Lab 10/25/17 1503 10/25/17 1525 10/25/17 1921 10/26/17 0801 10/27/17 1146 10/28/17 0929 10/29/17 0611  NA 138 139  --  138 138 138 135  K 3.7 3.7  --  2.9* 3.2* 3.5 3.5  CL 101 103  --  105 104 106 105  CO2 22  --   --  23 23 24  21*  GLUCOSE 186* 181*  --  101* 116* 117* 89  BUN 24* 25*  --  22* 14 16 16   CREATININE 1.32* 1.10  --  1.01 0.86 0.76 0.75  CALCIUM 12.0*  --   --  10.9* 10.2 10.3 10.0  MG  --   --  1.1*  --  1.2*  --   --  ALKPHOS 66  --   --   --   --  67  --   AST 140*  --   --   --   --  73*  --   ALT 39  --   --   --   --  45  --   ALBUMIN 3.7  --   --   --   --  2.6*  --     Ref. Range 10/25/2017 15:34 10/25/2017 17:37 10/25/2017 23:45  Lactic Acid, Venous Latest Ref Range: 0.5 - 1.9 mmol/L 5.64 (HH) 4.82 (HH) 2.1 (HH)    Ref. Range 10/25/2017 15:03 10/26/2017 08:01 10/27/2017 11:46 10/28/2017 09:29  CK Total  Latest Ref Range: 49 - 397 U/L 5,905 (H) 2,067 (H) 825 (H) 377    Ref. Range 10/25/2017 19:21 10/25/2017 23:45 10/26/2017 08:01  Troponin I Latest Ref Range: <0.03 ng/mL 0.13 (HH) 0.19 (HH) 0.17 (HH)    Ref. Range 10/25/2017 23:45  Total CHOL/HDL Ratio Latest Units: RATIO 1.9  Cholesterol Latest Ref Range: 0 - 200 mg/dL 162  HDL Cholesterol Latest Ref Range: >40 mg/dL 86  LDL (calc) Latest Ref Range: 0 - 99 mg/dL 63  Triglycerides Latest Ref Range: <150 mg/dL 66  VLDL Latest Ref Range: 0 - 40 mg/dL 13    Ref. Range 10/25/2017 15:03  TSH Latest Ref Range: 0.350 - 4.500 uIU/mL 9.181 (H)    Ref. Range 10/26/2017 16:00  T4,Free(Direct) Latest Ref Range: 0.61 - 1.12 ng/dL 0.77    Ref. Range 10/25/2017 19:08  Hemoglobin A1C Latest Ref Range: 4.8 - 5.6 % 4.8   EKG: RBBB, no acute ST-T changes, NSR, no prior to compare  Ct Angio Head W Or Wo Contrast  Result Date: 10/27/2017 CLINICAL DATA:  Follow-up stroke. EXAM: CT ANGIOGRAPHY HEAD AND NECK TECHNIQUE: Multidetector CT imaging of the head and neck was performed using the standard protocol during bolus administration of intravenous contrast. Multiplanar CT image reconstructions and MIPs were obtained to evaluate the vascular anatomy. Carotid stenosis measurements (when applicable) are obtained utilizing NASCET criteria, using the distal internal carotid diameter as the denominator. CONTRAST:  102mL ISOVUE-370 IOPAMIDOL (ISOVUE-370) INJECTION 76% COMPARISON:  MRI of the head October 25, 2017 FINDINGS: CT HEAD FINDINGS BRAIN: No intraparenchymal hemorrhage, mass effect nor midline shift. The ventricles and sulci are normal for age. Confluent supratentorial white matter hypodensities. Old small LEFT cerebellar infarcts. No acute large vascular territory infarcts. Faint density bilateral frontal extra-axial spaces with indistinctness. Basal cisterns are patent. VASCULAR: Moderate to severe calcific atherosclerosis of the carotid siphons. SKULL: No skull fracture. No  significant scalp soft tissue swelling. SINUSES/ORBITS: Chronic severe LEFT maxillary sinusitis and chronic moderate LEFT sphenoid sinusitis. LEFT greater than RIGHT ethmoid mucosal thickening. Mastoid air cells are well aerated.The included ocular globes and orbital contents are non-suspicious. OTHER: None. CTA NECK FINDINGS: AORTIC ARCH: 4.3 cm ascending aorta. Moderate calcific atherosclerosis aortic arch and, intimal thickening. The origins of the innominate, left Common carotid artery and subclavian artery are widely patent. Moderate eccentric intimal thickening calcific atherosclerosis proximal LEFT subclavian artery. RIGHT CAROTID SYSTEM: Common carotid artery is widely patent, mildly tortuous course. Moderate eccentric calcific atherosclerosis carotid bifurcation without hemodynamically significant stenosis by NASCET criteria. Mild distal cervical ectasia without dissection or pseudoaneurysm. LEFT CAROTID SYSTEM: Common carotid artery is widely patent, mildly tortuous course. Moderate eccentric calcific atherosclerosis carotid bifurcation without hemodynamically significant stenosis by NASCET criteria. Mild distal cervical ectasia without dissection or pseudoaneurysm. VERTEBRAL ARTERIES:Left vertebral artery is dominant.  Normal appearance of the vertebral arteries, widely patent. SKELETON: Nonacute. 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 Grade 1 C3-4 anterolisthesis. Severe RIGHT greater than LEFT C3-4, bilateral C5-6 and RIGHT C6-7 neural foraminal narrowing. OTHER NECK: Soft tissues of the neck are nonacute though, not tailored for evaluation. Surgical clips RIGHT thyroid, however there is a 19 mm complex RIGHT thyroid nodule. UPPER CHEST: Included lung apices are clear. Small calcified mediastinal lymph nodes. No superior mediastinal lymphadenopathy. CTA HEAD FINDINGS: ANTERIOR CIRCULATION: Patent cervical internal carotid  arteries, petrous, cavernous and supra clinoid internal carotid arteries. Calcific atherosclerosis results in mild stenosis bilateral supraclinoid internal carotid arteries. Patent anterior communicating artery. Patent anterior and middle cerebral arteries, mild luminal irregularity compatible with atherosclerosis. No large vessel occlusion, significant stenosis, contrast extravasation or aneurysm. POSTERIOR CIRCULATION: Patent vertebral arteries, vertebrobasilar junction and basilar artery, as well as main branch vessels. Patent posterior cerebral arteries. Focal severe stenosis RIGHT P3 segment. Tiny bilateral posterior communicating arteries present. No large vessel occlusion, contrast extravasation or aneurysm. VENOUS SINUSES: Major dural venous sinuses are patent though not tailored for evaluation on this angiographic examination. ANATOMIC VARIANTS: None. DELAYED PHASE: No abnormal intracranial enhancement. MIP images reviewed. IMPRESSION: CT HEAD: 1. Minimal bifrontal subarachnoid hemorrhage, possible tiny subdural hematomas. 2. Moderate to severe chronic small vessel ischemic disease. Old small LEFT cerebellar infarct. CTA NECK: 1. No hemodynamically significant stenosis or acute vascular process in the neck. 2. Status post probable RIGHT thyroidectomy with complex 19 mm RIGHT thyroid nodule. Recommend thyroid sonogram on nonemergent basis. This follows ACR consensus guidelines: Managing Incidental Thyroid Nodules Detected on Imaging: White Paper of the ACR Incidental Thyroid Findings Committee. J Am Coll Radiol 2015; 12:143-150. 3. **An incidental finding of potential clinical significance has been found. 4.3 cm aneurysmal ascending aorta. Recommend annual imaging followup by CTA or MRA. This recommendation follows 2010 ACCF/AHA/AATS/ACR/ASA/SCA/SCAI/SIR/STS/SVM Guidelines for the Diagnosis and Management of Patients with Thoracic Aortic Disease. Circulation. 2010; 121: G867-Y195** 4. Multilevel severe  neural foraminal narrowing and moderate craniocervical canal stenosis. CTA HEAD: 1. No emergent large vessel occlusion. Severe stenosis RIGHT P3 segment. 2. Mild stenosis bilateral supraclinoid internal carotid arteries. Electronically Signed   By: Elon Alas M.D.   On: 10/27/2017 15:45   Dg Shoulder Right  Result Date: 10/25/2017 CLINICAL DATA:  Pain after fall. EXAM: RIGHT SHOULDER - 2+ VIEW COMPARISON:  None. FINDINGS: There is no evidence of fracture or dislocation. There is no evidence of arthropathy or other focal bone abnormality. Soft tissues are unremarkable. IMPRESSION: Negative. Electronically Signed   By: Dorise Bullion III M.D   On: 10/25/2017 16:42   Ct Head Wo Contrast  Result Date: 10/29/2017 CLINICAL DATA:  Follow-up exam for subarachnoid hemorrhage. EXAM: CT HEAD WITHOUT CONTRAST TECHNIQUE: Contiguous axial images were obtained from the base of the skull through the vertex without intravenous contrast. COMPARISON:  Prior CT from 10/27/2017. FINDINGS: Brain: Previously noted trace bifrontal subarachnoid hemorrhage and possible small subdural hemorrhage no longer clearly delineated, likely resolved in the interim. No new intracranial hemorrhage identified. No acute large vessel territory infarct. No mass lesion, midline shift or mass effect. No hydrocephalus. No appreciable extra-axial fluid collection. Atrophy with chronic small vessel ischemic disease again noted. Vascular: No hyperdense vessel. Calcified atherosclerosis noted at the skull base. Skull: Previously seen scalp contusion has essentially resolved. Calvarium intact and unchanged. Sinuses/Orbits: Globes and orbital soft tissues within normal limits. Left-sided paranasal sinus disease again noted, stable. No mastoid  effusion. Other: None. IMPRESSION: 1. Interval resolution of previously seen minimal bifrontal subarachnoid and subdural hemorrhage. No intracranial hemorrhage now identified. 2. No other new acute intracranial  process. 3. Stable atrophy with advanced chronic microvascular ischemic disease. Electronically Signed   By: Jeannine Boga M.D.   On: 10/29/2017 04:14   Ct Head Wo Contrast  Addendum Date: 10/25/2017   ADDENDUM REPORT: 10/25/2017 18:09 ADDENDUM: Omitted from the original discussion is the presence of multifocal mucosal thickening throughout the paranasal sinuses, most evident in the ethmoid sinuses bilaterally, sphenoid sinus and in the maxillary sinuses (left greater than right). Inspissated secretions are noted in the left maxillary sinus. These findings suggest chronic sinusitis. Electronically Signed   By: Vinnie Langton M.D.   On: 10/25/2017 18:09   Result Date: 10/25/2017 CLINICAL DATA:  84 year old male with history of trauma from a fall to the floor yesterday evening at 10 p.m. Right-sided weakness. EXAM: CT HEAD WITHOUT CONTRAST TECHNIQUE: Contiguous axial images were obtained from the base of the skull through the vertex without intravenous contrast. COMPARISON:  None. FINDINGS: Brain: Moderate cerebral and cerebellar atrophy. Patchy and confluent areas of decreased attenuation are noted throughout the deep and periventricular white matter of the cerebral hemispheres bilaterally, compatible with chronic microvascular ischemic disease. No evidence of acute infarction, hemorrhage, hydrocephalus, extra-axial collection or mass lesion/mass effect. Vascular: No hyperdense vessel or unexpected calcification. Skull: Normal. Negative for fracture or focal lesion. Sinuses/Orbits: No acute finding. Other: Soft tissue swelling in the scalp near the vertex. IMPRESSION: 1. Soft tissue swelling in the scalp near the vertex, presumably a hematoma. No acute intracranial abnormalities. 2. Moderate cerebral and cerebellar atrophy with extensive chronic microvascular ischemic changes in the cerebral white matter. Electronically Signed: By: Vinnie Langton M.D. On: 10/25/2017 17:01   Ct Angio Neck W Or Wo  Contrast  Result Date: 10/27/2017 CLINICAL DATA:  Follow-up stroke. EXAM: CT ANGIOGRAPHY HEAD AND NECK TECHNIQUE: Multidetector CT imaging of the head and neck was performed using the standard protocol during bolus administration of intravenous contrast. Multiplanar CT image reconstructions and MIPs were obtained to evaluate the vascular anatomy. Carotid stenosis measurements (when applicable) are obtained utilizing NASCET criteria, using the distal internal carotid diameter as the denominator. CONTRAST:  70mL ISOVUE-370 IOPAMIDOL (ISOVUE-370) INJECTION 76% COMPARISON:  MRI of the head October 25, 2017 FINDINGS: CT HEAD FINDINGS BRAIN: No intraparenchymal hemorrhage, mass effect nor midline shift. The ventricles and sulci are normal for age. Confluent supratentorial white matter hypodensities. Old small LEFT cerebellar infarcts. No acute large vascular territory infarcts. Faint density bilateral frontal extra-axial spaces with indistinctness. Basal cisterns are patent. VASCULAR: Moderate to severe calcific atherosclerosis of the carotid siphons. SKULL: No skull fracture. No significant scalp soft tissue swelling. SINUSES/ORBITS: Chronic severe LEFT maxillary sinusitis and chronic moderate LEFT sphenoid sinusitis. LEFT greater than RIGHT ethmoid mucosal thickening. Mastoid air cells are well aerated.The included ocular globes and orbital contents are non-suspicious. OTHER: None. CTA NECK FINDINGS: AORTIC ARCH: 4.3 cm ascending aorta. Moderate calcific atherosclerosis aortic arch and, intimal thickening. The origins of the innominate, left Common carotid artery and subclavian artery are widely patent. Moderate eccentric intimal thickening calcific atherosclerosis proximal LEFT subclavian artery. RIGHT CAROTID SYSTEM: Common carotid artery is widely patent, mildly tortuous course. Moderate eccentric calcific atherosclerosis carotid bifurcation without hemodynamically significant stenosis by NASCET criteria. Mild distal  cervical ectasia without dissection or pseudoaneurysm. LEFT CAROTID SYSTEM: Common carotid artery is widely patent, mildly tortuous course. Moderate eccentric calcific atherosclerosis carotid bifurcation without hemodynamically  significant stenosis by NASCET criteria. Mild distal cervical ectasia without dissection or pseudoaneurysm. VERTEBRAL ARTERIES:Left vertebral artery is dominant. Normal appearance of the vertebral arteries, widely patent. SKELETON: Nonacute. 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 Grade 1 C3-4 anterolisthesis. Severe RIGHT greater than LEFT C3-4, bilateral C5-6 and RIGHT C6-7 neural foraminal narrowing. OTHER NECK: Soft tissues of the neck are nonacute though, not tailored for evaluation. Surgical clips RIGHT thyroid, however there is a 19 mm complex RIGHT thyroid nodule. UPPER CHEST: Included lung apices are clear. Small calcified mediastinal lymph nodes. No superior mediastinal lymphadenopathy. CTA HEAD FINDINGS: ANTERIOR CIRCULATION: Patent cervical internal carotid arteries, petrous, cavernous and supra clinoid internal carotid arteries. Calcific atherosclerosis results in mild stenosis bilateral supraclinoid internal carotid arteries. Patent anterior communicating artery. Patent anterior and middle cerebral arteries, mild luminal irregularity compatible with atherosclerosis. No large vessel occlusion, significant stenosis, contrast extravasation or aneurysm. POSTERIOR CIRCULATION: Patent vertebral arteries, vertebrobasilar junction and basilar artery, as well as main branch vessels. Patent posterior cerebral arteries. Focal severe stenosis RIGHT P3 segment. Tiny bilateral posterior communicating arteries present. No large vessel occlusion, contrast extravasation or aneurysm. VENOUS SINUSES: Major dural venous sinuses are patent though not tailored for evaluation on this angiographic examination. ANATOMIC  VARIANTS: None. DELAYED PHASE: No abnormal intracranial enhancement. MIP images reviewed. IMPRESSION: CT HEAD: 1. Minimal bifrontal subarachnoid hemorrhage, possible tiny subdural hematomas. 2. Moderate to severe chronic small vessel ischemic disease. Old small LEFT cerebellar infarct. CTA NECK: 1. No hemodynamically significant stenosis or acute vascular process in the neck. 2. Status post probable RIGHT thyroidectomy with complex 19 mm RIGHT thyroid nodule. Recommend thyroid sonogram on nonemergent basis. This follows ACR consensus guidelines: Managing Incidental Thyroid Nodules Detected on Imaging: White Paper of the ACR Incidental Thyroid Findings Committee. J Am Coll Radiol 2015; 12:143-150. 3. **An incidental finding of potential clinical significance has been found. 4.3 cm aneurysmal ascending aorta. Recommend annual imaging followup by CTA or MRA. This recommendation follows 2010 ACCF/AHA/AATS/ACR/ASA/SCA/SCAI/SIR/STS/SVM Guidelines for the Diagnosis and Management of Patients with Thoracic Aortic Disease. Circulation. 2010; 121: W109-N235** 4. Multilevel severe neural foraminal narrowing and moderate craniocervical canal stenosis. CTA HEAD: 1. No emergent large vessel occlusion. Severe stenosis RIGHT P3 segment. 2. Mild stenosis bilateral supraclinoid internal carotid arteries. Electronically Signed   By: Elon Alas M.D.   On: 10/27/2017 15:45   Mr Jeri Cos TD Contrast  Result Date: 10/25/2017 CLINICAL DATA:  84 y/o M; found on bathroom floor last night at 2200 hours. Right-sided weakness and right-sided neglect. EXAM: MRI HEAD WITHOUT AND WITH CONTRAST TECHNIQUE: Multiplanar, multiecho pulse sequences of the brain and surrounding structures were obtained without and with intravenous contrast. CONTRAST:  27mL MULTIHANCE GADOBENATE DIMEGLUMINE 529 MG/ML IV SOLN COMPARISON:  10/25/2017 CT head FINDINGS: Brain: 12 mm focus of reduced diffusion within the right occipital lobe (series 2, image 25 and  series 5, image 2) compatible with acute/early subacute infarction. No hemorrhage or mass effect. Patchy confluentnonspecific foci of T2 FLAIR hyperintense signal abnormality in subcortical and periventricular white matter are compatible withmoderatechronic microvascular ischemic changes for age. Small chronic lacunar infarct within the left thalamus. Moderatebrain parenchymal volume loss. Motion degraded SWI sequence, no gross hemorrhage on GRE sequence. After administration of intravenous contrast there is thin diffuse smooth pachymeningeal enhancement. No brain parenchymal enhancement. Vascular: Normal flow voids. Skull and upper cervical spine: Soft tissue pannus surrounding odontoid process with mild cervical spinal canal stenosis may be degenerative, due to rheumatoid  disease, or crystal deposition disease. Mild scalp soft tissue thickening over the vertex may represent a contusion. No calvarial bone marrow edema. Sinuses/Orbits: Sphenoid, left ethmoid, and left mastoid opacification, fluid levels, and diffuse paranasal sinus mucosal thickening. No abnormal signal of mastoid air cells. Orbits are unremarkable. Other: None. IMPRESSION: 1. 12 mm focus of reduced diffusion in right occipital lobe, probably acute/early subacute infarct. No hemorrhage or mass effect. 2. Thin smooth diffuse pachymeningeal thickening without nodularity. This can be seen in the setting of head trauma or meningitis. No additional findings for differential considerations of intracranial hypotension, mass infiltration, granulomatous disease, or hypertrophic pachymeningitis. 3. Moderate chronic microvascular ischemic changes and moderate parenchymal volume loss of the brain. 4. Extensive paranasal sinus disease with fluid levels which may represent acute sinusitis. These results will be called to the ordering clinician or representative by the Radiologist Assistant, and communication documented in the PACS or zVision Dashboard.  Electronically Signed   By: Kristine Garbe M.D.   On: 10/25/2017 22:43   Dg Chest Port 1 View  Result Date: 10/25/2017 CLINICAL DATA:  Right-sided weakness, right-sided neglect EXAM: PORTABLE CHEST 1 VIEW COMPARISON:  01/25/2010 FINDINGS: There is no focal parenchymal opacity. There is no pleural effusion or pneumothorax. The heart and mediastinal contours are unremarkable. There is mild osteoarthritis of bilateral glenohumeral joints. IMPRESSION: No active disease. Electronically Signed   By: Kathreen Devoid   On: 10/25/2017 15:55   Dg Hips Bilat With Pelvis 3-4 Views  Result Date: 10/25/2017 CLINICAL DATA:  Pain after fall EXAM: DG HIP (WITH OR WITHOUT PELVIS) 3-4V BILAT COMPARISON:  None. FINDINGS: There is no evidence of hip fracture or dislocation. There is no evidence of arthropathy or other focal bone abnormality. IMPRESSION: Negative. Electronically Signed   By: Dorise Bullion III M.D   On: 10/25/2017 16:46    EEG Impression: This awake anddrowsyEEG is abnormal due to diffuse slowing of the waking background. Clinical Correlation of the above findings indicates diffuse cerebral dysfunction that is non-specific in etiologythat can due to excessive drowsiness but alsocan be seen with hypoxic/ischemic injury, toxic/metabolic encephalopathies, neurodegenerative disorders, or medication effect.Clinical correlation is advised. Metta Clines, DO    Results/Tests Pending at Time of Discharge:  Unresulted Labs (From admission, onward)   Start     Ordered   10/29/17 7353  Basic metabolic panel  Daily,   R    Question:  Specimen collection method  Answer:  Lab=Lab collect   10/28/17 1529   10/29/17 0500  CBC  Daily,   R    Question:  Specimen collection method  Answer:  Lab=Lab collect   10/28/17 1529      Discharge Medications:  Allergies as of 10/29/2017   No Known Allergies     Medication List    STOP taking these medications   aspirin EC 81 MG tablet Replaced by:   aspirin 81 MG chewable tablet   hydrochlorothiazide 25 MG tablet Commonly known as:  HYDRODIURIL     TAKE these medications   amLODipine 10 MG tablet Commonly known as:  NORVASC Take 10 mg by mouth daily.   aspirin 81 MG chewable tablet Chew 1 tablet (81 mg total) by mouth daily. Start on 10/30/17 Start taking on:  10/30/2017 Replaces:  aspirin EC 81 MG tablet   atorvastatin 40 MG tablet Commonly known as:  LIPITOR Take 1 tablet (40 mg total) by mouth daily at 6 PM.   clopidogrel 75 MG tablet Commonly known as:  PLAVIX Take  1 tablet (75 mg total) by mouth daily. Start 10/30/17 Start taking on:  10/30/2017   levothyroxine 25 MCG tablet Commonly known as:  SYNTHROID, LEVOTHROID Take 1 tablet (25 mcg total) by mouth daily before breakfast. Start taking on:  10/30/2017   nitroGLYCERIN 0.4 MG SL tablet Commonly known as:  NITROSTAT Place 1 tablet (0.4 mg total) under the tongue every 5 (five) minutes x 3 doses as needed for chest pain.   polyethylene glycol powder powder Commonly known as:  GLYCOLAX/MIRALAX Take 17 g by mouth 2 (two) times daily as needed. What changed:  reasons to take this       Discharge Instructions: Please refer to Patient Instructions section of EMR for full details.  Patient was counseled important signs and symptoms that should prompt return to medical care, changes in medications, dietary instructions, activity restrictions, and follow up appointments.   Follow-Up Appointments: Follow-up Information    Garvin Fila, MD. Schedule an appointment as soon as possible for a visit in 6 week(s).   Specialties:  Neurology, Radiology Contact information: [address] [place] [address] [place], [place], [place] 10/29/2017, 2:11 PM PGY-1, [place]

## 2017-10-27 NOTE — Progress Notes (Signed)
Routine EEG completed, results pending. 

## 2017-10-27 NOTE — Evaluation (Signed)
Physical Therapy Evaluation Patient Details Name: Ian [name] MRN: [id number] DOB: Jan 21, 1926 Today's Date: 10/27/2017   History of Present Illness  84 y.o. male admitted after a fall and possible syncope. Pt found to have rhabdomyolysis, leukocytosis, and acute right occipital lobe infarct. PMH includes HTN, benign parathyroid neoplasm, calculus of kidney, and possible hx of dementia?.   Clinical Impression  Pt presents with overall decrease in functional mobility secondary to above including impairments listed below (see PT Problem List). Pt requires physical assist for functional mobility at this time where he was previously independent. Pt's wife present and supportive throughout session. Pt to benefit from comprehensive inpatient therapies to maximize safety, functional mobility, and independence prior to d/c home.    Follow Up Recommendations CIR    Equipment Recommendations  (TBA at next venue of care)    Recommendations for Other Services Rehab consult;OT consult     Precautions / Restrictions Precautions Precautions: Fall Restrictions Weight Bearing Restrictions: No      Mobility  Bed Mobility Overal bed mobility: Needs Assistance Bed Mobility: Supine to Sit     Supine to sit: Mod assist;HOB elevated     General bed mobility comments: mod assist for elevation of trunk to upright and rotating hips using bed pad. min assist for scooting to EOB. VCs for sequencing  Transfers Overall transfer level: Needs assistance Equipment used: 1 person hand held assist Transfers: Sit to/from Stand;Lateral/Scoot Transfers Sit to Stand: Mod assist;From elevated surface        Lateral/Scoot Transfers: Min guard General transfer comment: sit<>stand: multimodal cues and min to mod assist at times for anterior translation and boost up from bed, and extension to stand. lateral/scoot transfer to drop arm chair min guard for set up and safety  Ambulation/Gait             General  Gait Details: pt unable/unwilling to attempt at this time  Stairs            Wheelchair Mobility    Modified Rankin (Stroke Patients Only) Modified Rankin (Stroke Patients Only) Pre-Morbid Rankin Score: No significant disability Modified Rankin: Moderately severe disability     Balance Overall balance assessment: Needs assistance Sitting-balance support: Feet supported;Bilateral upper extremity supported Sitting balance-Leahy Scale: Poor Sitting balance - Comments: requires bil UE support sitting EOB to maintain balance   Standing balance support: During functional activity Standing balance-Leahy Scale: Poor Standing balance comment: requires HHA to maintain static standing                             Pertinent Vitals/Pain Pain Assessment: Faces Faces Pain Scale: Hurts even more Pain Location: R UE Pain Descriptors / Indicators: Aching;Grimacing Pain Intervention(s): Monitored during session;Repositioned;Limited activity within patient's tolerance    Home Living Family/patient expects to be discharged to:: Private residence Living Arrangements: Spouse/significant other Available Help at Discharge: Family Type of Home: House Home Access: Stairs to enter   Technical [employer]wer of Steps: 3 Home Layout: Two level;Able to live on main level with bedroom/bathroom        Prior Function Level of Independence: Independent         Comments: wife notes some forgetfulness in recent months     Hand Dominance        Extremity/Trunk Assessment   Upper Extremity Assessment Upper Extremity Assessment: Defer to OT evaluation(echymosis on R UE and shoulder)    Lower Extremity Assessment Lower Extremity Assessment: Generalized weakness;RLE deficits/detail;Difficult to  assess due to impaired cognition(decreased attention/command following) RLE Deficits / Details: offloading during functional activity likely due to pain       Communication    Communication: HOH  Cognition Arousal/Alertness: Lethargic Behavior During Therapy: Flat affect Overall Cognitive Status: Impaired/Different from baseline Area of Impairment: Orientation;Attention;Following commands;Safety/judgement;Awareness;Problem solving                 Orientation Level: Time Current Attention Level: Sustained   Following Commands: Follows one step commands inconsistently;Follows one step commands with increased time Safety/Judgement: Decreased awareness of safety;Decreased awareness of deficits Awareness: Emergent Problem Solving: Slow processing;Decreased initiation;Difficulty sequencing;Requires verbal cues;Requires tactile cues General Comments: pt falling asleep intermittently during beginning of session. decreased attention requirig continuous multimodal cues to complete tasks      General Comments General comments (skin integrity, edema, etc.): pt's wife present. VSS throughout. Noted inconsistent results with testing of right visual field and lack of blinking with confrontation testing. Pt with overall lack of insight to defecits.     Exercises     Assessment/Plan    PT Assessment Patient needs continued PT services  PT Problem List Decreased strength;Decreased mobility;Decreased balance;Decreased cognition;Decreased safety awareness;Decreased knowledge of precautions;Pain       PT Treatment Interventions Gait training;Functional mobility training;Therapeutic activities;Therapeutic exercise;Balance training;Neuromuscular re-education;Manual techniques;Patient/family education;Stair training;DME instruction    PT Goals (Current goals can be found in the Care Plan section)  Acute Rehab PT Goals Patient Stated Goal: to go home PT Goal Formulation: With patient/family Time For Goal Achievement: 11/10/17 Potential to Achieve Goals: Good    Frequency Min 4X/week   Barriers to discharge        Co-evaluation               AM-PAC PT "6  Clicks" Daily Activity  Outcome Measure Difficulty turning over in bed (including adjusting bedclothes, sheets and blankets)?: Unable Difficulty moving from lying on back to sitting on the side of the bed? : Unable Difficulty sitting down on and standing up from a chair with arms (e.g., wheelchair, bedside commode, etc,.)?: Unable Help needed moving to and from a bed to chair (including a wheelchair)?: A Little Help needed walking in hospital room?: A Lot Help needed climbing 3-5 steps with a railing? : Total 6 Click Score: 9    End of Session Equipment Utilized During Treatment: Gait belt Activity Tolerance: Patient tolerated treatment well;Patient limited by pain Patient left: in chair;with call bell/phone within reach;with family/visitor present;with chair alarm set;with nursing/sitter in room Nurse Communication: Mobility status PT Visit Diagnosis: Unsteadiness on feet (R26.81);Other abnormalities of gait and mobility (R26.89);History of falling (Z91.81);Muscle weakness (generalized) (M62.81);Difficulty in walking, not elsewhere classified (R26.2)    Time: 4174-0814 PT Time Calculation (min) (ACUTE ONLY): 29 min   Charges:   PT Evaluation $PT Eval Moderate Complexity: 1 Mod PT Treatments $Therapeutic Activity: 8-22 mins   PT G Codes:        Vic Ripper, SPT  Vic Ripper 10/27/2017, 1:09 PM

## 2017-10-27 NOTE — Progress Notes (Signed)
Preliminary notes by tech--Bilateral carotid duplex exam completed. Bilateral vertebral arteries antegrade flow. No evidence of  hemodynamic stenosis bilaterally.     [place] Alexismarie Flaim (RDMS, RVT)

## 2017-10-27 NOTE — Evaluation (Signed)
Occupational Therapy Evaluation Patient Details Name: Ian [name] MRN: [id number] DOB: February 09, 1926 Today's Date: 10/27/2017    History of Present Illness 84 y.o. male admitted after a fall and possible syncope. Pt found to have rhabdomyolysis, leukocytosis, and acute right occipital lobe infarct. PMH includes HTN, benign parathyroid neoplasm, calculus of kidney, and possible hx of dementia?.    Clinical Impression   PTA, pt was living with his wife and was independent. Pt currently requiring Min-Mod A for UB ADLs, Max A for LB ADLs, and Max A for functional mobility with RW. Pt presenting with poor balance, vision, and cognition. Pt with decreased functional; use of BUEs due to pain in right shoulder, poor grasp, and decreased coordination. Pt motivated to return to PLOF and wife very supportive. Pt will require further acute OT to facilitate safe dc. Recommend dc to CIR for intensive OT to optimize safety, independence with ADLs and functional mobility, and return to PLOF.     Follow Up Recommendations  CIR;Supervision/Assistance - 24 hour    Equipment Recommendations  Other (comment)(Defer to next venue)    Recommendations for Other Services Rehab consult;PT consult;Speech consult     Precautions / Restrictions Precautions Precautions: Fall Restrictions Weight Bearing Restrictions: No      Mobility Bed Mobility Overal bed mobility: Needs Assistance Bed Mobility: Supine to Sit;Sit to Supine     Supine to sit: Mod assist Sit to supine: Min assist   General bed mobility comments: Mod A to elevate trunk into sitting. Min A for bringing RLE over EOB  Transfers Overall transfer level: Needs assistance Equipment used: Rolling walker (2 wheeled) Transfers: Sit to/from Stand;Lateral/Scoot Transfers Sit to Stand: From elevated surface;Min assist;+2 physical assistance         General transfer comment: Min A for power up into standing. Max A for standing balance once upright.    Balance Overall balance assessment: Needs assistance Sitting-balance support: Feet supported;Bilateral upper extremity supported Sitting balance-Leahy Scale: Fair Sitting balance - Comments: Able to reach forward to adjust sock   Standing balance support: During functional activity Standing balance-Leahy Scale: Poor Standing balance comment: Reliant on UE support and physical A                           ADL either performed or assessed with clinical judgement   ADL Overall ADL's : Needs assistance/impaired Eating/Feeding: Set up;Supervision/ safety;Sitting   Grooming: Set up;Supervision/safety;Sitting   Upper Body Bathing: Moderate assistance;Sitting   Lower Body Bathing: Maximal assistance;Sit to/from stand   Upper Body Dressing : Minimal assistance;Sitting Upper Body Dressing Details (indicate cue type and reason): Min A for donning new gown.  Lower Body Dressing: Maximal assistance;+2 for safety/equipment;Sit to/from stand Lower Body Dressing Details (indicate cue type and reason): Max A for standing balance. Pt able to bend forward to pull up sock with LUE. RUE with limited reach due to pain             Functional mobility during ADLs: Maximal assistance;+2 for physical assistance;Rolling walker(side steps to [facility]) General ADL Comments: Pt demosntrating poor fucntional performance. Max A for LB ADLs and present with poor balance, vision, and cognition     Vision Baseline Vision/History: Wears glasses Wears Glasses: Reading only Patient Visual Report: No change from baseline Vision Assessment?: Yes;Vision impaired- to be further tested in functional context Eye Alignment: Within Functional Limits Ocular Range of Motion: Within Functional Limits Alignment/Gaze Preference: Within Defined Limits Tracking/Visual Pursuits: Decreased smoothness  of horizontal tracking;Decreased smoothness of vertical tracking;Requires cues, head turns, or add eye shifts to  track;Unable to hold eye position out of midline;Impaired - to be further tested in functional context Depth Perception: Overshoots;Undershoots Additional Comments: Pt reports he has no vision deficits. Noted poor targeted reachering during finger-to-nose test and during UB dressing. Pt with poor smooth tracking and unabel to maintain gaze outside of midline. Pt fatigues quickly.      Perception     Praxis      Pertinent Vitals/Pain Pain Assessment: Faces Faces Pain Scale: Hurts even more Pain Location: R UE Pain Descriptors / Indicators: Aching;Grimacing Pain Intervention(s): Monitored during session;Limited activity within patient's tolerance;Repositioned     Hand Dominance Right   Extremity/Trunk Assessment Upper Extremity Assessment Upper Extremity Assessment: RUE deficits/detail;LUE deficits/detail RUE Deficits / Details: Poor shoulder A/PROM due to pain. Shoulder forward flexion 0-50 degrees. Unable to perform abduction and external rotation. Decreased grasp strength. Unable to bring hand to face RUE: Unable to fully assess due to pain RUE Coordination: decreased fine motor;decreased gross motor LUE Deficits / Details: Decreased grasp and coordination. Poor grasp and pinch strength. Decreased finger opposition and required increased time and effort. LUE Coordination: decreased fine motor;decreased gross motor   Lower Extremity Assessment Lower Extremity Assessment: Defer to PT evaluation RLE Deficits / Details: offloading during functional activity likely due to pain   Cervical / Trunk Assessment Cervical / Trunk Assessment: Other exceptions Cervical / Trunk Exceptions: Forward lean with shoulders   Communication Communication Communication: HOH   Cognition Arousal/Alertness: Awake/alert Behavior During Therapy: WFL for tasks assessed/performed;Flat affect Overall Cognitive Status: Impaired/Different from baseline Area of Impairment: Attention;Following  commands;Safety/judgement;Awareness;Problem solving                   Current Attention Level: Sustained   Following Commands: Follows one step commands inconsistently;Follows one step commands with increased time Safety/Judgement: Decreased awareness of safety;Decreased awareness of deficits Awareness: Emergent Problem Solving: Slow processing;Decreased initiation;Difficulty sequencing;Requires verbal cues;Requires tactile cues General Comments: Pt demonstarting decreased problem solving, attention, and awareness. Required increased cues and time throughout session.    General Comments  Wife present throughout session    Exercises     Shoulder [place] expects to be discharged to:: Private residence Living Arrangements: Spouse/significant other Available Help at Discharge: Family Type of Home: House Home Access: Stairs to enter [employer] of Steps: [place]: Two level;Able to live on main level with bedroom/bathroom     Bathroom Shower/Tub: Occupational psychologist: Standard                Prior Functioning/Environment Level of Independence: Independent        Comments: wife notes some forgetfulness in recent months        OT Problem List: Decreased strength;Decreased range of motion;Decreased activity tolerance;Impaired balance (sitting and/or standing);Impaired vision/perception;Decreased coordination;Decreased cognition;Decreased safety awareness;Decreased knowledge of use of DME or AE;Decreased knowledge of precautions;Pain;Impaired UE functional use      OT Treatment/Interventions: Self-care/ADL training;Therapeutic exercise;Energy conservation;DME and/or AE instruction;Therapeutic activities;Patient/family education    OT Goals(Current goals can be found in the care plan section) Acute Rehab OT Goals Patient Stated Goal: to go home OT Goal Formulation: With patient/family Time For  Goal Achievement: 11/10/17 Potential to Achieve Goals: Good ADL Goals Pt Will Perform Grooming: with min assist;standing Pt Will Perform Upper Body Dressing: sitting;with set-up;with supervision Pt Will Perform Lower Body Dressing:  with min guard assist;sit to/from stand Pt Will Transfer to Toilet: with min guard assist;bedside commode;stand pivot transfer Pt Will Perform Toileting - Clothing Manipulation and hygiene: with min guard assist;sit to/from stand  OT Frequency: Min 2X/week   Barriers to D/C:            Co-evaluation              AM-PAC PT "6 Clicks" Daily Activity     Outcome Measure Help from another person eating meals?: None Help from another person taking care of personal grooming?: A Little Help from another person toileting, which includes using toliet, bedpan, or urinal?: A Lot Help from another person bathing (including washing, rinsing, drying)?: A Lot Help from another person to put on and taking off regular upper body clothing?: A Lot Help from another person to put on and taking off regular lower body clothing?: A Lot 6 Click Score: 15   End of Session Equipment Utilized During Treatment: Gait belt;Rolling walker Nurse Communication: Mobility status  Activity Tolerance: Patient tolerated treatment well Patient left: in bed;with call bell/phone within reach;with nursing/sitter in room;with family/visitor present  OT Visit Diagnosis: Unsteadiness on feet (R26.81);Other abnormalities of gait and mobility (R26.89);Muscle weakness (generalized) (M62.81);Other symptoms and signs involving cognitive function;Pain Pain - Right/Left: Right Pain - part of body: Shoulder                Time: 9163-8466 OT Time Calculation (min): 25 min Charges:  OT General Charges $OT Visit: 1 Visit OT Evaluation $OT Eval Moderate Complexity: 1 Mod OT Treatments $Self Care/Home Management : 8-22 mins G-Codes:     Bijou Easler MSOT, OTR/L Acute Rehab Pager:  [phone] Office: [place] 10/27/2017, 5:35 PM

## 2017-10-27 NOTE — [place] Notes (Addendum)
STROKE TEAM PROGRESS NOTE   HPI   Ian [name] is an 84 y.o. male  HTN admitted with a reported fall, possible syncopal event at home, then laying multiple hours (approximately 16 hours) laying on floor. Presented to hospital with Rhado, Leukocytosis and Multiple electrolyte and thyroid abnormalities. MRI reveals an acute Right occipital lobe infarct. Patient is complaining of dizziness and right sided weakness.  Wife states that at baseline, the patient has had "dementia" for the past few years. He has never had a formal Neurological evaluation for this. She states that he drinks at least 3-4 times per day, with his meals and in the evenings. His baseline personality, according to the wife, is very impatient and Type A personality. "He always likes to be in charge".  When he initially feel in the bathroom the wife attempted to help him up but the patient refused. She then went to bed and allowed him to lay on the floor until later the following day. Excess alcohol intake and family dynamics may have played a role in this unusual explanation.   Date last known well: Date: 10/25/2017 Time last known well: Time: 22:00 tPA Given: No: Outside the window Modified Rankin: Rankin Score=1 NIHSS: 0    SUBJECTIVE (INTERVAL HISTORY) His wife is at the bedside.  She was watching TV last night with her husband when he went up to go to the bathroom and she fell asleep. She found him lying on bathroom floor on the right side curled around the toilet.She does report gradual memory loss over the past several years, increasing in intensity within the past several weeks. . She mentioned to his primary physician during his last visit but they had no findings. Patient is sitting up in the chair, awake and alert. Having just work with PT.   OBJECTIVE Vitals:   10/27/17 0103 10/27/17 0140 10/27/17 0420 10/27/17 0944  BP: (!) 175/103 (!) 203/99 (!) 142/73 (!) 146/82  Pulse: 98  (!) 108 (!) 110  Resp: 18  20  20   Temp:   97.9 F (36.6 C) 98 F (36.7 C)  TempSrc:   Oral Oral  SpO2:   99% 100%  Weight:   74.8 kg (164 lb 14.5 oz)   Height:        CBC:  Recent Labs  Lab 10/25/17 1503  10/26/17 0801 10/27/17 1146  WBC 15.6*  --  18.0* 14.2*  NEUTROABS 13.8*  --   --   --   HGB 16.7   < > 14.5 13.6  HCT 46.8   < > 41.4 40.4  MCV 97.1  --  97.6 99.8  PLT 215  --  169 178   < > = values in this interval not displayed.    Basic Metabolic Panel:  Recent Labs  Lab 10/25/17 1921 10/26/17 0801 10/27/17 1146  NA  --  138 138  K  --  2.9* 3.2*  CL  --  105 104  CO2  --  23 23  GLUCOSE  --  101* 116*  BUN  --  22* 14  CREATININE  --  1.01 0.86  CALCIUM  --  10.9* 10.2  MG 1.1*  --  1.2*    Lipid Panel:     Component Value Date/Time   CHOL 162 10/25/2017 2345   TRIG 66 10/25/2017 2345   HDL 86 10/25/2017 2345   CHOLHDL 1.9 10/25/2017 2345   VLDL 13 10/25/2017 2345   LDLCALC 63 10/25/2017 2345  HgbA1c:  Lab Results  Component Value Date   HGBA1C 4.8 10/25/2017   Urine Drug Screen:     Component Value Date/Time   LABOPIA NONE DETECTED 10/25/2017 1513   COCAINSCRNUR NONE DETECTED 10/25/2017 1513   LABBENZ NONE DETECTED 10/25/2017 1513   AMPHETMU NONE DETECTED 10/25/2017 1513   THCU NONE DETECTED 10/25/2017 1513   LABBARB NONE DETECTED 10/25/2017 1513    Alcohol Level     Component Value Date/Time   ETH <10 10/25/2017 1503    PHYSICAL EXAM Pleasant elderly Caucasian male sitting in bedside chair. Not in distress. He has multiple bruises from his recent fall. . Afebrile. Head is nontraumatic. Neck is supple without bruit.    Cardiac exam no murmur or gallop. Lungs are clear to auscultation. Distal pulses are well felt. Neurological Exam :  Patient alert and oriented to person. 0/3 object recall after 1 min. Animal naming:  8 in 1 min. Speech clear. No aphasia. No dysarthria. Extraoccular movements intact. Visual fields full. Face symmetric. Tongue midline. Moves all  extremities x 4. Strength normal. Coordination normal. Sensation intact.  Gait not tested. ASSESSMENT/PLAN [name] is a 84 y.o. male with no significant past medical history found down by wife with right-sided weakness and dizziness, presenting with rhabdomyolysis, leukocytosis and multiple electrolyte and thyroid abnormalities. He did not receive IV t-PA due to being outside the window.   Stroke:  right occipital embolic infarct, workup underway .Etiology of unexplained fall unclear as to syncopal event or arrhythmia or unwitnessed seizure  Resultant  right-sided weakness, not related to current stroke (but from lying on the floor)  CT no acute stroke. Soft tissue swelling in the scalp. Small vessel disease. multifocal mucosal thickening throughout the paranasal sinuses, left greater than right suggest chronic sinusitis.  MRI  right occipital acute/subacute infarct. Atrophy. Small vessel disease. Extensive paranasal sinus disease with air-fluid levels which may represent acute sinusitis  CTA Head and neck pending    Carotid Doppler  No B ICA stenosis, VAs antegrade   2D Echo  EF 65-70%. No source of embolus   EEG- diffuse slowing of the waking background  LDL 63. Started on Lipitor 40 by primary team  HgbA1c 4.8  Heparin 5000 units sq tid for VTE prophylaxis  Fall precautions  Diet full liquid Room service appropriate? Yes; Fluid consistency: Thin  aspirin 81 mg daily prior to admission, now on aspirin 81 mg daily and clopidogrel 75 mg daily. Plans for dual antiplatelets 3 weeks then plavix alone  Therapy recommendations:  CIR  Disposition:  pending   Syncope  Likely multifactorial   Hypertension   Permissive hypertension  Okay to resume home medications   Other Stroke Risk Factors  Advanced age  13-4x/d ETOH use. At risk for atrial fibrillation given etoh use   Other Active Problems  Rhabdomyolysis   Transaminitis   AKI resolved   Hip and  shoulder x-ray negative, CXR neg  memory loss - check B-12 (ordered), TSH (9.181), RPR (ordered) and EEG (diffuse slowing)  Leukocytosis, 18->14.2  hypothyroid, on Synthroid  Elevated troponin, mild  Recommend avoiding dextrose-containing solutions with acute stroke if possible  Hospital day # [place] for Pager information 10/27/2017 3:34 PM  I have personally examined this patient, reviewed notes, independently viewed imaging studies, participated in medical decision making and plan of care.ROS completed by me personally and pertinent positives fully documented  I have made any additions or  clarifications directly to the above note. Agree with note above. I had a long discussion with the patient and family at the bedside regarding his unexplained fall and small occipital infarct which is embolic and stroke workup is ensuing. Strong suspicion for  AFIb . Greater than 50% time during this 35 minute visit was spent on counseling and coordination of care about his embolic stroke, fall and answered questions  Antony Contras, [place] Pager: [phone] 10/27/2017 4:05 PM  To contact Stroke Continuity provider, please refer to [email or web address] After hours, contact General Neurology

## 2017-10-27 NOTE — Progress Notes (Signed)
PT Cancellation Note  Patient Details Name: Ian [name] MRN: [id number] DOB: 1926/07/22   Cancelled Treatment:    Reason Eval/Treat Not Completed: Patient at procedure or test/unavailable. Will check back later as schedule allows.   Vic Ripper, SPT Vic Ripper 10/27/2017, 9:07 AM

## 2017-10-27 NOTE — Consult Note (Signed)
Physical Medicine and Rehabilitation Consult Reason for Consult: Decreased functional mobility Referring Physician: Family medicine   HPI: Ian [name] is a 84 y.o. right handed male with history of hypertension. Per chart review and wife, patient lives with spouse, who works. Reported be independent prior to admission with some reported memory loss in the recent months. 2 level home 3 steps to entry. Presented 10/25/2017 with right lower extremity weakness and altered mental status as well as reported fall possible syncopal event. Patient was down for approximately 16 hours before being found. CT/MRI the brain reviewed, showing right occipital infarct. Per report, 12 mm focus of reduced diffusion in the right occipital lobe, acute early subacute infarct. No hemorrhage or mass effect. CT angiogram head and neck are pending as well as carotid Dopplers. BUN 24, creatinine 1.32, CK elevated 5905, UDS negative, lactic acid 5.64. Patient placed on IV fluids. Echocardiogram with ejection fraction of 83% grade 1 diastolic dysfunction. EEG negative for seizure. Neurology follow-up placed on aspirin and Plavix 3 weeks then Plavix alone. CK improved 825. Subcutaneous heparin for DVT prophylaxis. Full liquid diet and advanced as tolerated. Physical therapy evaluation completed 10/27/2017 with recommendations of physical medicine rehabilitation consult.   Review of Systems  Constitutional: Negative for chills and fever.  HENT: Negative for hearing loss.   Eyes: Negative for blurred vision and double vision.  Respiratory: Negative for cough and shortness of breath.   Cardiovascular: Negative for chest pain and palpitations.  Gastrointestinal: Positive for constipation. Negative for nausea and vomiting.  Genitourinary: Positive for urgency.  Musculoskeletal: Positive for falls.  Skin: Negative for rash.  Neurological: Positive for focal weakness.  Psychiatric/Behavioral: Positive for memory loss.    All other systems reviewed and are negative.  Past Medical History:  Diagnosis Date  . 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 Calculus of kidney 03/31/2013   Overview:  IMPRESSION: see ER note   . Leukocytosis 10/26/2017  . Loss of weight 10/26/2017  . Multiple leg contusions, unspecified laterality, sequela 10/26/2017  . Scalp hematoma, subsequent encounter 10/26/2017   No past surgical history. No pertinent family history of premature CVA. Social History:  reports that  has never smoked. he has never used smokeless tobacco. He reports that he does not use drugs. His alcohol history is not on file. Allergies: No Known Allergies Medications Prior to Admission  Medication Sig Dispense Refill  . amLODipine (NORVASC) 10 MG tablet Take 10 mg by mouth daily.     Marland Kitchen aspirin EC 81 MG tablet Take 81 mg by mouth daily.     . hydrochlorothiazide (HYDRODIURIL) 25 MG tablet Take 25 mg by mouth daily.     . polyethylene glycol powder (GLYCOLAX/MIRALAX) powder Take 17 g by mouth 2 (two) times daily as needed. (Patient taking differently: Take 17 g by mouth 2 (two) times daily as needed for mild constipation. ) 3350 g 0    Home: Home Living Family/patient expects to be discharged to:: Private residence Living Arrangements: Spouse/significant other Available Help at Discharge: Family Type of Home: House Home Access: Stairs to enter Technical brewer of Steps: 3 Home Layout: Two level, Able to live on main level with bedroom/bathroom  Functional History: Prior Function Level of Independence: Independent Comments: wife notes some forgetfulness in recent months Functional Status:  Mobility: Bed Mobility Overal bed mobility: Needs Assistance Bed Mobility: Supine to Sit Supine to sit: Mod assist, HOB elevated General bed mobility comments: mod assist for elevation of trunk  to upright and rotating hips using bed pad. min assist for scooting to EOB. VCs for sequencing Transfers Overall  transfer level: Needs assistance Equipment used: 1 person hand held assist Transfers: Sit to/from Stand, Lateral/Scoot Transfers Sit to Stand: Mod assist, From elevated surface  Lateral/Scoot Transfers: Min guard General transfer comment: sit<>stand: multimodal cues and min to mod assist at times for anterior translation and boost up from bed, and extension to stand. lateral/scoot transfer to drop arm chair min guard for set up and safety Ambulation/Gait General Gait Details: pt unable/unwilling to attempt at this time    ADL:    Cognition: Cognition Overall Cognitive Status: Impaired/Different from baseline Orientation Level: Oriented to time, Disoriented to place, Oriented to person, Disoriented to situation Cognition Arousal/Alertness: Lethargic Behavior During Therapy: Flat affect Overall Cognitive Status: Impaired/Different from baseline Area of Impairment: Orientation, Attention, Following commands, Safety/judgement, Awareness, Problem solving Orientation Level: Time Current Attention Level: Sustained Following Commands: Follows one step commands inconsistently, Follows one step commands with increased time Safety/Judgement: Decreased awareness of safety, Decreased awareness of deficits Awareness: Emergent Problem Solving: Slow processing, Decreased initiation, Difficulty sequencing, Requires verbal cues, Requires tactile cues General Comments: pt falling asleep intermittently during beginning of session. decreased attention requirig continuous multimodal cues to complete tasks  Blood pressure (!) 146/82, pulse (!) 110, temperature 98 F (36.7 C), temperature source Oral, resp. rate 20, height 5\' 11"  (1.803 m), weight 74.8 kg (164 lb 14.5 oz), SpO2 100 %. Physical Exam  Vitals reviewed. Constitutional: He appears well-developed and well-nourished.  Sitter at bedside  HENT:  Head: Normocephalic.  Right Ear: External ear normal.  Left Ear: External ear normal.  Eyes: EOM  are normal. Right eye exhibits no discharge. Left eye exhibits no discharge.  Pupils reactive to light  Neck: Normal range of motion. Neck supple. No thyromegaly present.  Cardiovascular: Normal rate, regular rhythm and normal heart sounds.  Respiratory:  Fair inspiratory effort with some upper airway congestion  GI: Soft. Bowel sounds are normal. He exhibits no distension.  Musculoskeletal:  No edema or tenderness in extremities  Neurological: He is alert.  HOH Followed simple commands.  A&Ox3 with cues Very limited historian Motor: LUE: 5/5 proximal to distal LLE: HF, KE 4-/5, ADF 4-/5 RUE: 3+/5 proximal to distal RLE: HF, KE 2/5, ADF 0/5 Sensation intact to light touch  Skin: Skin is warm and dry.  Scattered hematomas  Psychiatric: He is slowed.    Results for orders placed or performed during the hospital encounter of 10/25/17 (from the past 24 hour(s))  T4, free     Status: None   Collection Time: 10/26/17  4:00 PM  Result Value Ref Range   Free T4 0.77 0.61 - 1.12 ng/dL  Magnesium     Status: Abnormal   Collection Time: 10/27/17 11:46 AM  Result Value Ref Range   Magnesium 1.2 (L) 1.7 - 2.4 mg/dL  Basic metabolic panel     Status: Abnormal   Collection Time: 10/27/17 11:46 AM  Result Value Ref Range   Sodium 138 135 - 145 mmol/L   Potassium 3.2 (L) 3.5 - 5.1 mmol/L   Chloride 104 101 - 111 mmol/L   CO2 23 22 - 32 mmol/L   Glucose, Bld 116 (H) 65 - 99 mg/dL   BUN 14 6 - 20 mg/dL   Creatinine, Ser 0.86 0.61 - 1.24 mg/dL   Calcium 10.2 8.9 - 10.3 mg/dL   GFR calc non Af Amer >60 >60 mL/min   GFR  calc Af Amer >60 >60 mL/min   Anion gap 11 5 - 15  CK     Status: Abnormal   Collection Time: 10/27/17 11:46 AM  Result Value Ref Range   Total CK 825 (H) 49 - 397 U/L  CBC     Status: Abnormal   Collection Time: 10/27/17 11:46 AM  Result Value Ref Range   WBC 14.2 (H) 4.0 - 10.5 K/uL   RBC 4.05 (L) 4.22 - 5.81 MIL/uL   Hemoglobin 13.6 13.0 - 17.0 g/dL   HCT 40.4  39.0 - 52.0 %   MCV 99.8 78.0 - 100.0 fL   MCH 33.6 26.0 - 34.0 pg   MCHC 33.7 30.0 - 36.0 g/dL   RDW 13.3 11.5 - 15.5 %   Platelets 178 150 - 400 K/uL   Dg Shoulder Right  Result Date: 10/25/2017 CLINICAL DATA:  Pain after fall. EXAM: RIGHT SHOULDER - 2+ VIEW COMPARISON:  None. FINDINGS: There is no evidence of fracture or dislocation. There is no evidence of arthropathy or other focal bone abnormality. Soft tissues are unremarkable. IMPRESSION: Negative. Electronically Signed   By: Dorise Bullion III M.D   On: 10/25/2017 16:42   Ct Head Wo Contrast  Addendum Date: 10/25/2017   ADDENDUM REPORT: 10/25/2017 18:09 ADDENDUM: Omitted from the original discussion is the presence of multifocal mucosal thickening throughout the paranasal sinuses, most evident in the ethmoid sinuses bilaterally, sphenoid sinus and in the maxillary sinuses (left greater than right). Inspissated secretions are noted in the left maxillary sinus. These findings suggest chronic sinusitis. Electronically Signed   By: Vinnie Langton M.D.   On: 10/25/2017 18:09   Result Date: 10/25/2017 CLINICAL DATA:  84 year old male with history of trauma from a fall to the floor yesterday evening at 10 p.m. Right-sided weakness. EXAM: CT HEAD WITHOUT CONTRAST TECHNIQUE: Contiguous axial images were obtained from the base of the skull through the vertex without intravenous contrast. COMPARISON:  None. FINDINGS: Brain: Moderate cerebral and cerebellar atrophy. Patchy and confluent areas of decreased attenuation are noted throughout the deep and periventricular white matter of the cerebral hemispheres bilaterally, compatible with chronic microvascular ischemic disease. No evidence of acute infarction, hemorrhage, hydrocephalus, extra-axial collection or mass lesion/mass effect. Vascular: No hyperdense vessel or unexpected calcification. Skull: Normal. Negative for fracture or focal lesion. Sinuses/Orbits: No acute finding. Other: Soft tissue  swelling in the scalp near the vertex. IMPRESSION: 1. Soft tissue swelling in the scalp near the vertex, presumably a hematoma. No acute intracranial abnormalities. 2. Moderate cerebral and cerebellar atrophy with extensive chronic microvascular ischemic changes in the cerebral white matter. Electronically Signed: By: Vinnie Langton M.D. On: 10/25/2017 17:01   Mr Jeri Cos FU Contrast  Result Date: 10/25/2017 CLINICAL DATA:  84 y/o M; found on bathroom floor last night at 2200 hours. Right-sided weakness and right-sided neglect. EXAM: MRI HEAD WITHOUT AND WITH CONTRAST TECHNIQUE: Multiplanar, multiecho pulse sequences of the brain and surrounding structures were obtained without and with intravenous contrast. CONTRAST:  49mL MULTIHANCE GADOBENATE DIMEGLUMINE 529 MG/ML IV SOLN COMPARISON:  10/25/2017 CT head FINDINGS: Brain: 12 mm focus of reduced diffusion within the right occipital lobe (series 2, image 25 and series 5, image 2) compatible with acute/early subacute infarction. No hemorrhage or mass effect. Patchy confluentnonspecific foci of T2 FLAIR hyperintense signal abnormality in subcortical and periventricular white matter are compatible withmoderatechronic microvascular ischemic changes for age. Small chronic lacunar infarct within the left thalamus. Moderatebrain parenchymal volume loss. Motion degraded SWI sequence,  no gross hemorrhage on GRE sequence. After administration of intravenous contrast there is thin diffuse smooth pachymeningeal enhancement. No brain parenchymal enhancement. Vascular: Normal flow voids. Skull and upper cervical spine: Soft tissue pannus surrounding odontoid process with mild cervical spinal canal stenosis may be degenerative, due to rheumatoid disease, or crystal deposition disease. Mild scalp soft tissue thickening over the vertex may represent a contusion. No calvarial bone marrow edema. Sinuses/Orbits: Sphenoid, left ethmoid, and left mastoid opacification, fluid levels,  and diffuse paranasal sinus mucosal thickening. No abnormal signal of mastoid air cells. Orbits are unremarkable. Other: None. IMPRESSION: 1. 12 mm focus of reduced diffusion in right occipital lobe, probably acute/early subacute infarct. No hemorrhage or mass effect. 2. Thin smooth diffuse pachymeningeal thickening without nodularity. This can be seen in the setting of head trauma or meningitis. No additional findings for differential considerations of intracranial hypotension, mass infiltration, granulomatous disease, or hypertrophic pachymeningitis. 3. Moderate chronic microvascular ischemic changes and moderate parenchymal volume loss of the brain. 4. Extensive paranasal sinus disease with fluid levels which may represent acute sinusitis. These results will be called to the ordering clinician or representative by the Radiologist Assistant, and communication documented in the PACS or zVision Dashboard. Electronically Signed   By: Kristine Garbe M.D.   On: 10/25/2017 22:43   Dg Chest Port 1 View  Result Date: 10/25/2017 CLINICAL DATA:  Right-sided weakness, right-sided neglect EXAM: PORTABLE CHEST 1 VIEW COMPARISON:  01/25/2010 FINDINGS: There is no focal parenchymal opacity. There is no pleural effusion or pneumothorax. The heart and mediastinal contours are unremarkable. There is mild osteoarthritis of bilateral glenohumeral joints. IMPRESSION: No active disease. Electronically Signed   By: Kathreen Devoid   On: 10/25/2017 15:55   Dg Hips Bilat With Pelvis 3-4 Views  Result Date: 10/25/2017 CLINICAL DATA:  Pain after fall EXAM: DG HIP (WITH OR WITHOUT PELVIS) 3-4V BILAT COMPARISON:  None. FINDINGS: There is no evidence of hip fracture or dislocation. There is no evidence of arthropathy or other focal bone abnormality. IMPRESSION: Negative. Electronically Signed   By: Dorise Bullion III M.D   On: 10/25/2017 16:46    Assessment/Plan: Diagnosis: right occipital infarct Labs and images  independently reviewed.  Records reviewed and summated above. Stroke: Continue secondary stroke prophylaxis and Risk Factor Modification listed below:   Antiplatelet therapy:   Blood Pressure Management:  Continue current medication with prn's with permisive HTN per primary team Statin Agent:   Right sided hemiparesis: fit for orthosis to prevent contractures (PRAFO) Motor recovery: Fluoxetine   1. Does the need for close, 24 hr/day medical supervision in concert with the patient's rehab needs make it unreasonable for this patient to be served in a less intensive setting? Yes  2. Co-Morbidities requiring supervision/potential complications: HTN (monitor and provide prns in accordance with increased physical exertion and pain), diastolic dysfunction (monitor for signs/symptoms of fluid overload), Tachycardia (monitor in accordance with pain and increasing activity), hypothyroidism (cont meds, ensure appropriate mood and energy level for therapies), hypokalemia (continue to monitor and replete as necessary), leukocytosis (cont to monitor for signs and symptoms of infection, further workup if indicated) 3. Due to safety, skin/wound care, disease management, medication administration and patient education, does the patient require 24 hr/day rehab nursing? Yes 4. Does the patient require coordinated care of a physician, rehab nurse, PT (1-2 hrs/day, 5 days/week), OT (1-2 hrs/day, 5 days/week) and SLP (1-2 hrs/day, 5 days/week) to address physical and functional deficits in the context of the above medical diagnosis(es)? Yes  Addressing deficits in the following areas: balance, endurance, locomotion, strength, transferring, bathing, dressing, toileting, cognition, speech, swallowing and psychosocial support 5. Can the patient actively participate in an intensive therapy program of at least 3 hrs of therapy per day at least 5 days per week? Yes 6. The potential for patient to make measurable gains while on  inpatient rehab is excellent 7. Anticipated functional outcomes upon discharge from inpatient rehab are supervision and min assist  with PT, supervision and min assist with OT, supervision and min assist with SLP. 8. Estimated rehab length of stay to reach the above functional goals is: 13-18 days. 9. Anticipated D/C setting: Home 10. Anticipated post D/C treatments: HH therapy and Home excercise program 11. Overall Rehab/Functional Prognosis: good  RECOMMENDATIONS: This patient's condition is appropriate for continued rehabilitative care in the following setting: CIR after completion of medical workup Patient has agreed to participate in recommended program. Potentially Note that insurance prior authorization may be required for reimbursement for recommended care.  Comment: Rehab Admissions Coordinator to follow up.  Delice Lesch, MD, ABPMR Lavon Paganini Angiulli, PA-C 10/27/2017

## 2017-10-27 NOTE — [place] (Signed)
Doctor on call paged about patient's blood pressure and she stated that as far patient B/p remains below 220 she is not going to put in a new order. Patient not symptomatic at this time will continue monitor

## 2017-10-27 NOTE — Procedures (Signed)
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  Medications: Amlodipine Aspirin Clopdogrel Fluticasone Levothyroxine  Technical Summary: A multichannel digital EEG recording measured by the international 10-20 system with electrodes applied with paste and impedances below 5000 ohms performed as portable with EKG monitoring in an awake and but drowsy patient.  Hyperventilation and photic stimulation were not performed.  The digital EEG was referentially recorded, reformatted, and digitally filtered in a variety of bipolar and referential montages for optimal display.   Description: The patient is awake and drowsy during the recording.  During maximal wakefulness, there is a symmetric, low voltage 8 Hz posterior dominant rhythm that attenuates with eye opening. This is admixed with diffuse 4-5 Hz theta and 2-3 Hz delta slowing of the waking background.  During drowsiness, there is an increase in theta slowing of the background.  Stage 2 sleep is not seen.  There were no epileptiform discharges or electrographic seizures seen.    EKG lead was unremarkable.  Impression: This awake and drowsy EEG is abnormal due to diffuse slowing of the waking background.  Clinical Correlation of the above findings indicates diffuse cerebral dysfunction that is non-specific in etiology that can due to excessive drowsiness but also can be seen with hypoxic/ischemic injury, toxic/metabolic encephalopathies, neurodegenerative disorders, or medication effect.  Clinical correlation is advised.   Metta Clines, DO

## 2017-10-27 NOTE — Progress Notes (Signed)
Inpatient Rehabilitation  Per PT request, patient was screened by Gunnar Fusi for appropriateness for an Inpatient Acute Rehab consult.  At this time we are recommending an Inpatient Rehab consult.  Text paged medical team to notify; please order if you are agreeable.    Carmelia Roller., CCC/SLP Admission Coordinator  [place]  Cell [phone]

## 2017-10-27 NOTE — Care Management Note (Signed)
Case Management Note  Patient Details  Name: Lysle Yero MRN: [id number] Date of Birth: Sep 14, 1925  Subjective/Objective:   Pt admitted after a fall and found to have a CVA. He is from home with spouse.                 Action/Plan: PT recommending CIR. Awaiting OT eval. CM following for discharge disposition.   Expected Discharge Date:                  Expected Discharge Plan:  [place]  In-House Referral:     Discharge planning Services  CM Consult  Post Acute Care Choice:    Choice offered to:     DME Arranged:    DME Agency:     HH Arranged:    [place] Agency:     Status of Service:  In process, will continue to follow  If discussed at Long Length of Stay Meetings, dates discussed:    Additional Comments:  Pollie Friar, RN 10/27/2017, 12:07 PM

## 2017-10-27 NOTE — Progress Notes (Signed)
Family Medicine Teaching Service Daily Progress Note Intern Pager: [phone]  Patient name: Ian [name] Medical record number: [id number] Date of birth: 02/06/1926 Age: 84 y.o. Gender: male  Primary Care Provider: Bernerd Limbo, MD Consultants: Neurology  Code Status: Full   Pt Overview and Major Events to Date:  Admitted to [facility] on 3/2 for syncope, rhabdomyolysis; brain MRI with "12 mm focus of reduced diffusion in right occipital lobe, probably acute/early subacute infarct"  Assessment and Plan: Ian [name] a 84 y.o.malepresenting with episode of syncope, unwitnessed and subsequent rhabdomyolysis. PMH is significant forhypertension and mild cognitive impairment.   Syncope  Uncertain etiology;unwitnessed`falland patient does not remember the event. MRI showing acute/subacute infart in right occipital lob giving likely etiology. Cardiac etiology less likely given no T- STchanges noted on initial EKG and no chest pain, dyspneaor history of palpitations and no known cardiac history. Seizure additionally considered given blood in the patient's mouth, although no known h/o seizure activity and no witnessed seizure activity. Echo showing LVEF 65-70%, G1DD, with no regional wall motion abnormalities  - Neurology consult, appreciate recommendations: recommend continuing plavix + ASA for 3 weeks and then plavix alone, holding statin, permissive HTN for 24-48 hrs (end 3/5) and then gradual normalization within 5-7 days - will continue statin - per Neuro will obtain cardiology evaluation and monitoring for Afib - Frequent neuro checks - EKG pending - PT/OT/SLP - full liquid diet per SLP swallow eval  - Continue to monitor for fevers; held off starting empiric antibiotics as we have no source for infection at this time - EEG pending   Probable acute stroke Infarct of R occipital lobe - continue plan as above  - Neurology consult - carotid US pending - risk strat labs  unremarkable  AMS  Worse than baseline with last Mini Mental status exam score of 21/30 in August 2018. Appears to have new stroke. Ethanol and UDS negative.  - ordered sitter - ordered mittens, as patient pulled out foley this morning - may need to consider haldol but had prolonged QT on last EKG  Eevated troponin  i-STAT troponin 0.11 in the emergency department. Troponin I trended at 0.13>0.19>0.17. No chest pain. Troponins may be elevated in the setting of acute [place] vs. [place] ischemia.EKG on admission negative for acute changes. - permissive hypertension - repeat EKG pending - echo as noted above - risk start with lipid panel, TSH, A1c --> A1c 4.8; total cholesterol 162, HDL 86, LDL 63  Rhabdomyolysis CK improved to 2067 on 3/3 from 5905 on admission. Patient down for roughly 10 hours. LA 4.with downward trended of 5.64>4.82>2.1.  - recheck AM CK  - will need to monitor UOP closely, per nursing staff patient is incontinent and that is why no urine is charted - place condom cath  - strict Is and Os  AKI - Resolved Cr of 1.01, unknown baseline. Likely prerenal in setting of being found down after several hours. - monitor on AM BMP  HTN Home meds include norvasc and HCTZ. Hypertensive overnight to 203/99. Currently 142/73 - permissive HTN till SBP > 220  Transaminitis  AST elevated at 140 with ALT 39. Patient endorses daily alcohol use with a beer every day at lunch and a cocktail at dinner - monitor on CIWA, overnight scores of 3  Hypothyroidism TSH elevated to 9.181. Free T4 0.77 -continue synthroid mcg  FEN/GI:Full liquid diet, D5 NS @120  cc/hr  Prophylaxis:SCDs, hold off chemical prophylaxis due to soft tissue head hematoma  Disposition: continued inpatient  stay   Subjective:  Patient states he is doing well. Denies CP or SOB. Denies pain.   Objective: Temp:  [97.9 F (36.6 C)-99.2 F (37.3 C)] 98 F (36.7 C) (03/04 0944) Pulse Rate:  [78-110]  110 (03/04 0944) Resp:  [18-20] 20 (03/04 0944) BP: (130-203)/(69-103) 146/82 (03/04 0944) SpO2:  [95 %-100 %] 100 % (03/04 0944) Weight:  [164 lb 14.5 oz (74.8 kg)] 164 lb 14.5 oz (74.8 kg) (03/04 0420) Physical Exam: General: awake and alert, NAD, sitting in chair eating  Eyes: EOMI, PERRL Cardiovascular: RRR, No MRG Respiratory: CTAB, no wheezes, rales, or rhonchi  Abdomen: soft, bowel sounds normal  Extremities: no edema, non tender  Neuro: AAOx3, 5/5 muscle strength, difficulty with ADLs  Laboratory: Recent Labs  Lab 10/25/17 1503 10/25/17 1525 10/26/17 0801  WBC 15.6*  --  18.0*  HGB 16.7 16.3 14.5  HCT 46.8 48.0 41.4  PLT 215  --  169   Recent Labs  Lab 10/25/17 1503 10/25/17 1525 10/26/17 0801  NA 138 139 138  K 3.7 3.7 2.9*  CL 101 103 105  CO2 22  --  23  BUN 24* 25* 22*  CREATININE 1.32* 1.10 1.01  CALCIUM 12.0*  --  10.9*  PROT 7.7  --   --   BILITOT 1.8*  --   --   ALKPHOS 66  --   --   ALT 39  --   --   AST 140*  --   --   GLUCOSE 186* 181* 101*    Imaging/Diagnostic Tests: Dg Shoulder Right  Result Date: 10/25/2017 CLINICAL DATA:  Pain after fall. EXAM: RIGHT SHOULDER - 2+ VIEW COMPARISON:  None. FINDINGS: There is no evidence of fracture or dislocation. There is no evidence of arthropathy or other focal bone abnormality. Soft tissues are unremarkable. IMPRESSION: Negative. Electronically Signed   By: Dorise Bullion III M.D   On: 10/25/2017 16:42   Ct Head Wo Contrast  Addendum Date: 10/25/2017   ADDENDUM REPORT: 10/25/2017 18:09 ADDENDUM: Omitted from the original discussion is the presence of multifocal mucosal thickening throughout the paranasal sinuses, most evident in the ethmoid sinuses bilaterally, sphenoid sinus and in the maxillary sinuses (left greater than right). Inspissated secretions are noted in the left maxillary sinus. These findings suggest chronic sinusitis. Electronically Signed   By: Vinnie Langton M.D.   On: 10/25/2017 18:09    Result Date: 10/25/2017 CLINICAL DATA:  84 year old male with history of trauma from a fall to the floor yesterday evening at 10 p.m. Right-sided weakness. EXAM: CT HEAD WITHOUT CONTRAST TECHNIQUE: Contiguous axial images were obtained from the base of the skull through the vertex without intravenous contrast. COMPARISON:  None. FINDINGS: Brain: Moderate cerebral and cerebellar atrophy. Patchy and confluent areas of decreased attenuation are noted throughout the deep and periventricular white matter of the cerebral hemispheres bilaterally, compatible with chronic microvascular ischemic disease. No evidence of acute infarction, hemorrhage, hydrocephalus, extra-axial collection or mass lesion/mass effect. Vascular: No hyperdense vessel or unexpected calcification. Skull: Normal. Negative for fracture or focal lesion. Sinuses/Orbits: No acute finding. Other: Soft tissue swelling in the scalp near the vertex. IMPRESSION: 1. Soft tissue swelling in the scalp near the vertex, presumably a hematoma. No acute intracranial abnormalities. 2. Moderate cerebral and cerebellar atrophy with extensive chronic microvascular ischemic changes in the cerebral white matter. Electronically Signed: By: Vinnie Langton M.D. On: 10/25/2017 17:01   Mr Jeri Cos OX Contrast  Result Date: 10/25/2017 CLINICAL DATA:  84  y/o M; found on bathroom floor last night at 2200 hours. Right-sided weakness and right-sided neglect. EXAM: MRI HEAD WITHOUT AND WITH CONTRAST TECHNIQUE: Multiplanar, multiecho pulse sequences of the brain and surrounding structures were obtained without and with intravenous contrast. CONTRAST:  75mL MULTIHANCE GADOBENATE DIMEGLUMINE 529 MG/ML IV SOLN COMPARISON:  10/25/2017 CT head FINDINGS: Brain: 12 mm focus of reduced diffusion within the right occipital lobe (series 2, image 25 and series 5, image 2) compatible with acute/early subacute infarction. No hemorrhage or mass effect. Patchy confluentnonspecific foci of T2  FLAIR hyperintense signal abnormality in subcortical and periventricular white matter are compatible withmoderatechronic microvascular ischemic changes for age. Small chronic lacunar infarct within the left thalamus. Moderatebrain parenchymal volume loss. Motion degraded SWI sequence, no gross hemorrhage on GRE sequence. After administration of intravenous contrast there is thin diffuse smooth pachymeningeal enhancement. No brain parenchymal enhancement. Vascular: Normal flow voids. Skull and upper cervical spine: Soft tissue pannus surrounding odontoid process with mild cervical spinal canal stenosis may be degenerative, due to rheumatoid disease, or crystal deposition disease. Mild scalp soft tissue thickening over the vertex may represent a contusion. No calvarial bone marrow edema. Sinuses/Orbits: Sphenoid, left ethmoid, and left mastoid opacification, fluid levels, and diffuse paranasal sinus mucosal thickening. No abnormal signal of mastoid air cells. Orbits are unremarkable. Other: None. IMPRESSION: 1. 12 mm focus of reduced diffusion in right occipital lobe, probably acute/early subacute infarct. No hemorrhage or mass effect. 2. Thin smooth diffuse pachymeningeal thickening without nodularity. This can be seen in the setting of head trauma or meningitis. No additional findings for differential considerations of intracranial hypotension, mass infiltration, granulomatous disease, or hypertrophic pachymeningitis. 3. Moderate chronic microvascular ischemic changes and moderate parenchymal volume loss of the brain. 4. Extensive paranasal sinus disease with fluid levels which may represent acute sinusitis. These results will be called to the ordering clinician or representative by the Radiologist Assistant, and communication documented in the PACS or zVision Dashboard. Electronically Signed   By: Kristine Garbe M.D.   On: 10/25/2017 22:43   Dg Chest Port 1 View  Result Date: 10/25/2017 CLINICAL DATA:   Right-sided weakness, right-sided neglect EXAM: PORTABLE CHEST 1 VIEW COMPARISON:  01/25/2010 FINDINGS: There is no focal parenchymal opacity. There is no pleural effusion or pneumothorax. The heart and mediastinal contours are unremarkable. There is mild osteoarthritis of bilateral glenohumeral joints. IMPRESSION: No active disease. Electronically Signed   By: Kathreen Devoid   On: 10/25/2017 15:55   Dg Hips Bilat With Pelvis 3-4 Views  Result Date: 10/25/2017 CLINICAL DATA:  Pain after fall EXAM: DG HIP (WITH OR WITHOUT PELVIS) 3-4V BILAT COMPARISON:  None. FINDINGS: There is no evidence of hip fracture or dislocation. There is no evidence of arthropathy or other focal bone abnormality. IMPRESSION: Negative. Electronically Signed   By: Dorise Bullion III M.D   On: 10/25/2017 16:46     Caroline More, DO 10/27/2017, 12:29 PM PGY-1, [place] Intern pager: [phone], text pages welcome

## 2017-10-28 DIAGNOSIS — I63331 Cerebral infarction due to thrombosis of right posterior cerebral artery: Secondary | ICD-10-CM

## 2017-10-28 DIAGNOSIS — S0990XD Unspecified injury of head, subsequent encounter: Secondary | ICD-10-CM

## 2017-10-28 DIAGNOSIS — I639 Cerebral infarction, unspecified: Secondary | ICD-10-CM

## 2017-10-28 LAB — CBC
HCT: 38.4 % — ABNORMAL LOW (ref 39.0–52.0)
Hemoglobin: 12.9 g/dL — ABNORMAL LOW (ref 13.0–17.0)
MCH: 33.5 pg (ref 26.0–34.0)
MCHC: 33.6 g/dL (ref 30.0–36.0)
MCV: 99.7 fL (ref 78.0–100.0)
Platelets: 170 10*3/uL (ref 150–400)
RBC: 3.85 MIL/uL — ABNORMAL LOW (ref 4.22–5.81)
RDW: 13.3 % (ref 11.5–15.5)
WBC: 11 10*3/uL — ABNORMAL HIGH (ref 4.0–10.5)

## 2017-10-28 LAB — COMPREHENSIVE METABOLIC PANEL
ALT: 45 U/L (ref 17–63)
AST: 73 U/L — ABNORMAL HIGH (ref 15–41)
Albumin: 2.6 g/dL — ABNORMAL LOW (ref 3.5–5.0)
Alkaline Phosphatase: 67 U/L (ref 38–126)
Anion gap: 8 (ref 5–15)
BUN: 16 mg/dL (ref 6–20)
CO2: 24 mmol/L (ref 22–32)
Calcium: 10.3 mg/dL (ref 8.9–10.3)
Chloride: 106 mmol/L (ref 101–111)
Creatinine, Ser: 0.76 mg/dL (ref 0.61–1.24)
GFR calc Af Amer: >60 mL/min (ref >60)
GFR calc non Af Amer: >60 mL/min (ref >60)
Glucose, Bld: 117 mg/dL — ABNORMAL HIGH (ref 65–99)
Potassium: 3.5 mmol/L (ref 3.5–5.1)
Sodium: 138 mmol/L (ref 135–145)
Total Bilirubin: 1.8 mg/dL — ABNORMAL HIGH (ref 0.3–1.2)
Total Protein: 5.7 g/dL — ABNORMAL LOW (ref 6.5–8.1)

## 2017-10-28 LAB — VITAMIN B12: Vitamin B-12: 210 pg/mL (ref 180–914)

## 2017-10-28 LAB — CK: Total CK: 377 U/L (ref 49–397)

## 2017-10-28 MED ORDER — AMLODIPINE BESYLATE 10 MG PO TABS
10.0000 mg | ORAL_TABLET | Freq: Every day | ORAL | Status: DC
  Administered 2017-10-29: 10 mg via ORAL
  Filled 2017-10-28: qty 1

## 2017-10-28 NOTE — Consult Note (Addendum)
ELECTROPHYSIOLOGY CONSULT NOTE  Patient ID: Ian [name] MRN: [id number], DOB/AGE: 1925/08/29   Admit date: 10/25/2017 Date of Consult: 10/28/2017  Primary Physician: Bernerd Limbo, MD Primary Cardiologist: none Reason for Consultation: Cryptogenic stroke/syncope; recommendations regarding Implantable Loop Recorder, requested by Dr. Leonie Man  History of Present Illness Ian [name] was admitted on 10/25/2017 with fall/trauma, stroke.  PMHx is noted for HTN alone, though there is report of 3-4 drinks daily, no significant health care/follow up out patient, suspect to have dimentia, chronic gait instibility in d/w Dr. Leonie Man    The patient suffered an fall/syncopal event with R sided weakness upon waking. Record notes:  after he cooked dinner when his wife found him lying on the floor on his right side curled around the toilet in the bathroom.  He was responsive, however seems confused and would not allow her to help him, call for help.  She checked on him throughout the night because she was unable to pick him up.  He remained somewhat confused but responsive all night long. This morning when he was still unable to stand up she called EMS  He was admitted with fall/suspect syncope, CVA, rhabdo likely 2/2 prolonged time on the floor, elevated WBC, electrolyte abnormalities   Imaging demonstrated right occipital embolic infarct, workup underway .Etiology of unexplained fall unclear as to syncopal event or arrhythmia or unwitnessed seizure   he has undergone workup for stroke including echocardiogram and carotid angio.  The patient has been monitored on telemetry which has demonstrated sinus rhythm with no arrhythmias.    I have discussed the case with Dr. Leonie Man.  He strongly suspects a diagnosis of AFib, though does not feel he is a good a/c candidate and thought were no TTEE, however is concerned about the syncopal event as being cardiac in etiology, perhaps stokes adams attack and wanted  consideration for loop implant 2/2 to this particularly.  Echocardiogram this admission demonstrated   Study Conclusions - Left ventricle: The cavity size was normal. There was mild   concentric hypertrophy. Systolic function was vigorous. The   estimated ejection fraction was in the range of 65% to 70%. Wall   motion was normal; there were no regional wall motion   abnormalities. Doppler parameters are consistent with abnormal   left ventricular relaxation (grade 1 diastolic dysfunction).   Doppler parameters are consistent with elevated ventricular   end-diastolic filling pressure. - Aortic valve: There was no regurgitation. - Mitral valve: Calcified annulus. Valve area by pressure   half-time: 2.27 cm^2. - Right ventricle: The cavity size was normal. Wall thickness was   normal. Systolic function was normal. - Right atrium: The atrium was normal in size. - Tricuspid valve: There was no regurgitation. - Pulmonary arteries: Systolic pressure could not be accurately   estimated. - Inferior vena cava: The vessel was normal in size. - Pericardium, extracardiac: There was no pericardial effusion.    Lab work is reviewed.  Prior to admission, the patient denies chest pain, shortness of breath, dizziness, palpitations, or syncope.  They are recovering from their stroke with plans it seem to CIR at discharge.   Past Medical History:  Diagnosis Date  . 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 Calculus of kidney 03/31/2013   Overview:  IMPRESSION: see ER note   . Leukocytosis 10/26/2017  . Loss of weight 10/26/2017  . Multiple leg contusions, unspecified laterality, sequela 10/26/2017  . Scalp hematoma, subsequent encounter 10/26/2017     Surgical History: History reviewed.  No pertinent surgical history.   Medications Prior to Admission  Medication Sig Dispense Refill Last Dose  . amLODipine (NORVASC) 10 MG tablet Take 10 mg by mouth daily.    Past Week at Unknown time  . aspirin  EC 81 MG tablet Take 81 mg by mouth daily.    Past Week at Unknown time  . hydrochlorothiazide (HYDRODIURIL) 25 MG tablet Take 25 mg by mouth daily.    Past Week at Unknown time  . polyethylene glycol powder (GLYCOLAX/MIRALAX) powder Take 17 g by mouth 2 (two) times daily as needed. (Patient taking differently: Take 17 g by mouth 2 (two) times daily as needed for mild constipation. ) 3350 g 0 prn at prn    Inpatient Medications:  . [START ON 10/29/2017] amLODipine  10 mg Oral Daily  . atorvastatin  40 mg Oral q1800  . fluticasone  2 spray Each Nare Daily  . levothyroxine  25 mcg Oral QAC breakfast    Allergies: No Known Allergies  Social History   Socioeconomic History  . Marital status: Married    Spouse name: Not on file  . Number of children: Not on file  . Years of education: Not on file  . Highest education level: Not on file  Social Needs  . Financial resource strain: Not on file  . Food insecurity - worry: Not on file  . Food insecurity - inability: Not on file  . Transportation needs - medical: Not on file  . Transportation needs - non-medical: Not on file  Occupational History  . Not on file  Tobacco Use  . Smoking status: Never Smoker  . Smokeless tobacco: Never Used  Substance and Sexual Activity  . Alcohol use: Not on file  . Drug use: No  . Sexual activity: Not on file  Other Topics Concern  . Not on file  Social History Narrative  . Not on file     Family: patient somewhat agitated, does not offer family Hx, did not pursue   Review of Systems: All other systems reviewed and are otherwise negative except as noted above.   Physical Exam: Vitals:   10/28/17 0000 10/28/17 0400 10/28/17 0600 10/28/17 0850  BP: (!) 138/93 (!) 152/93  121/75  Pulse: 84 86  85  Resp: 18 18  20   Temp: 97.7 F (36.5 C) 98 F (36.7 C)    TempSrc: Axillary Oral  Oral  SpO2: 99% 97%  91%  Weight:   170 lb 3.1 oz (77.2 kg)   Height:        GEN- The patient is well  appearing, thin body habitus and oriented to self at least.   Head- normocephalic, atraumatic Eyes-  Sclera clear, conjunctiva pink Ears- hearing intact Oropharynx- clear Neck- supple Lungs- CTA b/l, normal work of breathing Heart- RRR,  no murmurs, rubs or gallops  GI- soft, NT, ND Extremities- no clubbing, cyanosis, or edema MS- no significant deformity, age appropriate atrophy Skin- no rash or lesion Psych- euthymic mood, full affect   Labs:   Lab Results  Component Value Date   WBC 11.0 (H) 10/28/2017   HGB 12.9 (L) 10/28/2017   HCT 38.4 (L) 10/28/2017   MCV 99.7 10/28/2017   PLT 170 10/28/2017    Recent Labs  Lab 10/25/17 1503  10/27/17 1146  NA 138   < > 138  K 3.7   < > 3.2*  CL 101   < > 104  CO2 22   < > 23  BUN 24*   < > 14  CREATININE 1.32*   < > 0.86  CALCIUM 12.0*   < > 10.2  PROT 7.7  --   --   BILITOT 1.8*  --   --   ALKPHOS 66  --   --   ALT 39  --   --   AST 140*  --   --   GLUCOSE 186*   < > 116*   < > = values in this interval not displayed.   Lab Results  Component Value Date   OFBPZWC 585 (H) 10/27/2017   TROPONINI 0.17 (HH) 10/26/2017   Lab Results  Component Value Date   CHOL 162 10/25/2017   Lab Results  Component Value Date   HDL 86 10/25/2017   Lab Results  Component Value Date   LDLCALC 63 10/25/2017   Lab Results  Component Value Date   TRIG 66 10/25/2017   Lab Results  Component Value Date   CHOLHDL 1.9 10/25/2017   No results found for: LDLDIRECT  No results found for: DDIMER   Radiology/Studies:  Ct Angio Head W Or Wo Contrast Result Date: 10/27/2017 CLINICAL DATA:  Follow-up stroke. EXAM: CT ANGIOGRAPHY HEAD AND NECK TECHNIQUE: Multidetector CT imaging of the head and neck was performed using the standard protocol during bolus administration of intravenous contrast. Multiplanar CT image reconstructions and MIPs were obtained to evaluate the vascular anatomy. Carotid stenosis measurements (when applicable) are  obtained utilizing NASCET criteria, using the distal internal carotid diameter as the denominator. CONTRAST:  22mL ISOVUE-370 IOPAMIDOL (ISOVUE-370) INJECTION 76% COMPARISON:  MRI of the head October 25, 2017 FINDINGS: CT HEAD FINDINGS BRAIN: No intraparenchymal hemorrhage, mass effect nor midline shift. The ventricles and sulci are normal for age. Confluent supratentorial white matter hypodensities. Old small LEFT cerebellar infarcts. No acute large vascular territory infarcts. Faint density bilateral frontal extra-axial spaces with indistinctness. Basal cisterns are patent. VASCULAR: Moderate to severe calcific atherosclerosis of the carotid siphons. SKULL: No skull fracture. No significant scalp soft tissue swelling. SINUSES/ORBITS: Chronic severe LEFT maxillary sinusitis and chronic moderate LEFT sphenoid sinusitis. LEFT greater than RIGHT ethmoid mucosal thickening. Mastoid air cells are well aerated.The included ocular globes and orbital contents are non-suspicious. OTHER: None. CTA NECK FINDINGS: AORTIC ARCH: 4.3 cm ascending aorta. Moderate calcific atherosclerosis aortic arch and, intimal thickening. The origins of the innominate, left Common carotid artery and subclavian artery are widely patent. Moderate eccentric intimal thickening calcific atherosclerosis proximal LEFT subclavian artery. RIGHT CAROTID SYSTEM: Common carotid artery is widely patent, mildly tortuous course. Moderate eccentric calcific atherosclerosis carotid bifurcation without hemodynamically significant stenosis by NASCET criteria. Mild distal cervical ectasia without dissection or pseudoaneurysm. LEFT CAROTID SYSTEM: Common carotid artery is widely patent, mildly tortuous course. Moderate eccentric calcific atherosclerosis carotid bifurcation without hemodynamically significant stenosis by NASCET criteria. Mild distal cervical ectasia without dissection or pseudoaneurysm. VERTEBRAL ARTERIES:Left vertebral artery is dominant. Normal  appearance of the vertebral arteries, widely patent. SKELETON: Nonacute. 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 Grade 1 C3-4 anterolisthesis. Severe RIGHT greater than LEFT C3-4, bilateral C5-6 and RIGHT C6-7 neural foraminal narrowing. OTHER NECK: Soft tissues of the neck are nonacute though, not tailored for evaluation. Surgical clips RIGHT thyroid, however there is a 19 mm complex RIGHT thyroid nodule. UPPER CHEST: Included lung apices are clear. Small calcified mediastinal lymph nodes. No superior mediastinal lymphadenopathy. CTA HEAD FINDINGS: ANTERIOR CIRCULATION: Patent cervical internal carotid arteries, petrous, cavernous and  supra clinoid internal carotid arteries. Calcific atherosclerosis results in mild stenosis bilateral supraclinoid internal carotid arteries. Patent anterior communicating artery. Patent anterior and middle cerebral arteries, mild luminal irregularity compatible with atherosclerosis. No large vessel occlusion, significant stenosis, contrast extravasation or aneurysm. POSTERIOR CIRCULATION: Patent vertebral arteries, vertebrobasilar junction and basilar artery, as well as main branch vessels. Patent posterior cerebral arteries. Focal severe stenosis RIGHT P3 segment. Tiny bilateral posterior communicating arteries present. No large vessel occlusion, contrast extravasation or aneurysm. VENOUS SINUSES: Major dural venous sinuses are patent though not tailored for evaluation on this angiographic examination. ANATOMIC VARIANTS: None. DELAYED PHASE: No abnormal intracranial enhancement. MIP images reviewed. IMPRESSION: CT HEAD: 1. Minimal bifrontal subarachnoid hemorrhage, possible tiny subdural hematomas. 2. Moderate to severe chronic small vessel ischemic disease. Old small LEFT cerebellar infarct. CTA NECK: 1. No hemodynamically significant stenosis or acute vascular process in the neck. 2. Status post probable  RIGHT thyroidectomy with complex 19 mm RIGHT thyroid nodule. Recommend thyroid sonogram on nonemergent basis. This follows ACR consensus guidelines: Managing Incidental Thyroid Nodules Detected on Imaging: White Paper of the ACR Incidental Thyroid Findings Committee. J Am Coll Radiol 2015; 12:143-150. 3. **An incidental finding of potential clinical significance has been found. 4.3 cm aneurysmal ascending aorta. Recommend annual imaging followup by CTA or MRA. This recommendation follows 2010 ACCF/AHA/AATS/ACR/ASA/SCA/SCAI/SIR/STS/SVM Guidelines for the Diagnosis and Management of Patients with Thoracic Aortic Disease. Circulation. 2010; 121: R518-A416** 4. Multilevel severe neural foraminal narrowing and moderate craniocervical canal stenosis. CTA HEAD: 1. No emergent large vessel occlusion. Severe stenosis RIGHT P3 segment. 2. Mild stenosis bilateral supraclinoid internal carotid arteries. Electronically Signed   By: Elon Alas M.D.   On: 10/27/2017 15:45   Ct Head Wo Contrast Addendum Date: 10/25/2017   ADDENDUM REPORT: 10/25/2017 18:09 ADDENDUM: Omitted from the original discussion is the presence of multifocal mucosal thickening throughout the paranasal sinuses, most evident in the ethmoid sinuses bilaterally, sphenoid sinus and in the maxillary sinuses (left greater than right). Inspissated secretions are noted in the left maxillary sinus. These findings suggest chronic sinusitis. Electronically Signed   By: Vinnie Langton M.D.   On: 10/25/2017 18:09  Result Date: 10/25/2017 CLINICAL DATA:  84 year old male with history of trauma from a fall to the floor yesterday evening at 10 p.m. Right-sided weakness. EXAM: CT HEAD WITHOUT CONTRAST TECHNIQUE: Contiguous axial images were obtained from the base of the skull through the vertex without intravenous contrast. COMPARISON:  None. FINDINGS: Brain: Moderate cerebral and cerebellar atrophy. Patchy and confluent areas of decreased attenuation are noted  throughout the deep and periventricular white matter of the cerebral hemispheres bilaterally, compatible with chronic microvascular ischemic disease. No evidence of acute infarction, hemorrhage, hydrocephalus, extra-axial collection or mass lesion/mass effect. Vascular: No hyperdense vessel or unexpected calcification. Skull: Normal. Negative for fracture or focal lesion. Sinuses/Orbits: No acute finding. Other: Soft tissue swelling in the scalp near the vertex. IMPRESSION: 1. Soft tissue swelling in the scalp near the vertex, presumably a hematoma. No acute intracranial abnormalities. 2. Moderate cerebral and cerebellar atrophy with extensive chronic microvascular ischemic changes in the cerebral white matter. Electronically Signed: By: Vinnie Langton M.D. On: 10/25/2017 17:01   Mr Jeri Cos SA Contrast Result Date: 10/25/2017 CLINICAL DATA:  84 y/o M; found on bathroom floor last night at 2200 hours. Right-sided weakness and right-sided neglect. EXAM: MRI HEAD WITHOUT AND WITH CONTRAST TECHNIQUE: Multiplanar, multiecho pulse sequences of the brain and surrounding structures were obtained without and with intravenous contrast. CONTRAST:  36mL MULTIHANCE  GADOBENATE DIMEGLUMINE 529 MG/ML IV SOLN COMPARISON:  10/25/2017 CT head FINDINGS: Brain: 12 mm focus of reduced diffusion within the right occipital lobe (series 2, image 25 and series 5, image 2) compatible with acute/early subacute infarction. No hemorrhage or mass effect. Patchy confluentnonspecific foci of T2 FLAIR hyperintense signal abnormality in subcortical and periventricular white matter are compatible withmoderatechronic microvascular ischemic changes for age. Small chronic lacunar infarct within the left thalamus. Moderatebrain parenchymal volume loss. Motion degraded SWI sequence, no gross hemorrhage on GRE sequence. After administration of intravenous contrast there is thin diffuse smooth pachymeningeal enhancement. No brain parenchymal enhancement.  Vascular: Normal flow voids. Skull and upper cervical spine: Soft tissue pannus surrounding odontoid process with mild cervical spinal canal stenosis may be degenerative, due to rheumatoid disease, or crystal deposition disease. Mild scalp soft tissue thickening over the vertex may represent a contusion. No calvarial bone marrow edema. Sinuses/Orbits: Sphenoid, left ethmoid, and left mastoid opacification, fluid levels, and diffuse paranasal sinus mucosal thickening. No abnormal signal of mastoid air cells. Orbits are unremarkable. Other: None. IMPRESSION: 1. 12 mm focus of reduced diffusion in right occipital lobe, probably acute/early subacute infarct. No hemorrhage or mass effect. 2. Thin smooth diffuse pachymeningeal thickening without nodularity. This can be seen in the setting of head trauma or meningitis. No additional findings for differential considerations of intracranial hypotension, mass infiltration, granulomatous disease, or hypertrophic pachymeningitis. 3. Moderate chronic microvascular ischemic changes and moderate parenchymal volume loss of the brain. 4. Extensive paranasal sinus disease with fluid levels which may represent acute sinusitis. These results will be called to the ordering clinician or representative by the Radiologist Assistant, and communication documented in the PACS or zVision Dashboard. Electronically Signed   By: Kristine Garbe M.D.   On: 10/25/2017 22:43     12-lead ECG SR, 1st degree AVBlock PR 247ms, IVCD  All prior EKG's in EPIC reviewed with no documented atrial fibrillation  Telemetry SR, infrequent PVCs  Assessment and Plan:  1. Cryptogenic stroke The patient presents with cryptogenic stroke/syncope.  Neurology has aske [place] to evaluate for loop particularly given syncopal event.  They highly suspect PAFib though do not feel he is a good a/c candidate given gait instability, advanced age, fall.  The patient is sleeping but wakes easily, tells me his  name and birth date, tells me he fell in the bathroom, not sure why or what happened, mentions "it could have been the alcohol"  Though his wife states he only had a beer t lunch, a wine with dinner.  He is annoyed by our visit, somewhat agitated, curses, though wife is able to settle him and he goes back to sleep.    In d/w his wife at bedside, she is more inclined to do event monitoring 1st if anything at all.  She would like to give it some thought further.  Dr. Curt Bears will see later today   Baldwin [place], PA-C 10/28/2017  I have seen and examined this patient with Tommye Standard.  Agree with above, note added to reflect my findings.  On exam, RRR, no murmurs, lungs clear.  Patient admitted for fall and cryptogenic stroke. In discussing this with the patient, he does not believe that he had a stroke. He is very combative at the moment. Would continue current workup and defer LINQ monitoring until the patient has been educated on his stroke. It is more likely that a 30 day monitor would be more appropriate at this time for atrial fibrillation surveillance.  Will M. Camnitz MD 10/28/2017 2:16 PM

## 2017-10-28 NOTE — Progress Notes (Signed)
Physical Therapy Treatment Patient Details Name: Ian [name] MRN: [id number] DOB: [birth year] Today's Date: 10/28/2017    History of Present Illness 84 y.o. male admitted after a fall and possible syncope. Pt found to have rhabdomyolysis, leukocytosis, and acute right occipital lobe infarct. PMH includes HTN, benign parathyroid neoplasm, calculus of kidney, and possible hx of dementia?.     PT Comments    Pt progressing towards functional mobility goals. Pt ambulated ~5 ft in room with mod HHA +2 for stability and safety. Pt still deflecting from deficits but more agreeable to PT today and following commands with increased time. Pt's wife present and supportive throughout session. Will continue to follow acutely and progress as tolerated.    Follow Up Recommendations  CIR     Equipment Recommendations  (TBA next venue of care)    Recommendations for Other Services       Precautions / Restrictions Precautions Precautions: Fall Restrictions Weight Bearing Restrictions: No    Mobility  Bed Mobility Overal bed mobility: Needs Assistance Bed Mobility: Supine to Sit     Supine to sit: Mod assist     General bed mobility comments: mod assist to bring LEs off EOB and to elevate trunk  Transfers Overall transfer level: Needs assistance Equipment used: 2 person hand held assist Transfers: Sit to/from Stand Sit to Stand: Min assist;+2 physical assistance;Mod assist         General transfer comment: min A for power up to standing. mod A for stability in standing. consistent cues for technique and hand placement.  Ambulation/Gait Ambulation/Gait assistance: Mod assist;+2 physical assistance Ambulation Distance (Feet): 5 Feet Assistive device: 2 person hand held assist Gait Pattern/deviations: Step-to pattern;Shuffle;Decreased stride length;Drifts right/left;Trunk flexed Gait velocity: decreased Gait velocity interpretation: Below normal speed for age/gender General Gait  Details: ambulated for stand step transfer and ~5 ft in room. pt requires HHA+2 for balance. VCs for sequencing and upright posture. pt with decreased willingness to bear weight through R LE due to pain.    Stairs            Wheelchair Mobility    Modified Rankin (Stroke Patients Only) Modified Rankin (Stroke Patients Only) Pre-Morbid Rankin Score: No significant disability Modified Rankin: Moderately severe disability     Balance Overall balance assessment: Needs assistance Sitting-balance support: Feet supported;Bilateral upper extremity supported Sitting balance-Leahy Scale: Fair     Standing balance support: During functional activity;Bilateral upper extremity supported Standing balance-Leahy Scale: Poor Standing balance comment: reliant on bil UE support                            Cognition Arousal/Alertness: Awake/alert Behavior During Therapy: WFL for tasks assessed/performed Overall Cognitive Status: Impaired/Different from baseline Area of Impairment: Attention;Following commands;Safety/judgement;Awareness;Problem solving                   Current Attention Level: Sustained   Following Commands: Follows one step commands inconsistently;Follows one step commands with increased time Safety/Judgement: Decreased awareness of safety;Decreased awareness of deficits Awareness: Emergent Problem Solving: Slow processing;Decreased initiation;Difficulty sequencing General Comments: pt requiring cues and increased time throughout session      Exercises      General Comments General comments (skin integrity, edema, etc.): wife present throughout session      Pertinent Vitals/Pain Pain Assessment: Faces Faces Pain Scale: Hurts even more Pain Location: R UE and LE Pain Descriptors / Indicators: Aching;Grimacing Pain Intervention(s): Monitored during session;Limited activity within patient's  tolerance;Repositioned    Home Living                       Prior Function            PT Goals (current goals can now be found in the care plan section) Acute Rehab PT Goals Patient Stated Goal: to go home PT Goal Formulation: With patient/family Time For Goal Achievement: 11/10/17 Potential to Achieve Goals: Good Progress towards PT goals: Progressing toward goals    Frequency    Min 4X/week      PT Plan Current plan remains appropriate    Co-evaluation              AM-PAC PT "6 Clicks" Daily Activity  Outcome Measure  Difficulty turning over in bed (including adjusting bedclothes, sheets and blankets)?: Unable Difficulty moving from lying on back to sitting on the side of the bed? : Unable Difficulty sitting down on and standing up from a chair with arms (e.g., wheelchair, bedside commode, etc,.)?: Unable Help needed moving to and from a bed to chair (including a wheelchair)?: A Lot Help needed walking in hospital room?: A Lot Help needed climbing 3-5 steps with a railing? : Total 6 Click Score: 8    End of Session Equipment Utilized During Treatment: Gait belt Activity Tolerance: Patient tolerated treatment well Patient left: in chair;with call bell/phone within reach;with chair alarm set;with family/visitor present Nurse Communication: Mobility status PT Visit Diagnosis: Unsteadiness on feet (R26.81);Other abnormalities of gait and mobility (R26.89);History of falling (Z91.81);Muscle weakness (generalized) (M62.81);Difficulty in walking, not elsewhere classified (R26.2)     Time: 3662-9476 PT Time Calculation (min) (ACUTE ONLY): 18 min  Charges:  $Gait Training: 8-22 mins                    G Codes:       Vic Ripper, SPT   Vic Ripper 10/28/2017, 12:57 PM

## 2017-10-28 NOTE — Progress Notes (Addendum)
Speech Language Pathology Treatment: Dysphagia  Patient Details Name: Ian [name] MRN: [id number] DOB: June 24, 1926 Today's Date: 10/28/2017 Time: 0920-0932 SLP Time Calculation (min) (ACUTE ONLY): 12 min  Assessment / Plan / Recommendation Clinical Impression  Skilled treatment session focused on dysphagia goals as well as screen pt to assess if order for SLE is indicated. SLP facilitated session by providing skilled observation of pt consuming graham cracker with thin liquids via cup (pt refused straw). Pt with no oral phase difficulty but demonstrates extremely poor task toleration and is severely resistant to any cues to increase consumption for complete upgrade to regular. Recommend upgrade to dysphagia 3 d/t confusion/hallucinations and desire to direct all care.   Wife states that at baseline, the patient has had "dementia" for the past few years. He has never had a formal Neurological evaluation for this. She states that he drinks alcohol at least 3-4 times per day, with his meals and in the evenings. His baseline personality, according to the wife, is very impatient and Type A personality. "He always likes to be in charge". As such today, pt is very resistant to any cues for increasing consumption of graham crackers, he is very directive on removing straw and very frustrated with amount of water (too much) in cup. Pt is also very confused with information stated on nursing board in room - he insists that "I &O" should be "H2O". Pt is not responsive to any cues. SLE is not appropriate as pt's cognition, visual hallucinations and behaviors are largely d/t baseline deficits. ST will follow for possible upgrade to regular only.   Recommend follow up services at SNF d/t pt need and wife's inability to provide care for husband at baseline. When he initially feel in the bathroom the wife attempted to help him up but the patient refused. She then went to bed and allowed him to lay on the floor until  later the following day. Excess alcohol intake and family dynamics may have played a role in this unusual explanation.   HPI HPI: Ian [name] a 84 y.o.maleepresenting with episode of syncope, unwitnessed and subsequent rhabdomyolysis. PMH is significant forhypertension. Consider stroke given new neurologic changes with RLEweakness and confusion worse than baseline. CT head negative for acute hemorrhage,though positive for extensive chronic microvascular ischemic changes, and soft tissue hematoma. MRI W/WO contrast pending. Cardiac etiology also considered. Patient does have a elevated troponin which may be in the setting of rhabdomyolysis. No T- STchanges noted on EKG. No chest pain, dyspneaor history of palpitations and no known cardiac history. Seizure additionally considered given blood in the patient's mouth. He has never had a seizure before and if there is no witnessed seizure activity. Additionally infection must be considered as cause of syncope. Chest x-ray negative. Leukocytosis at 15.6. UA pending, no skin breakdown source of possible infection. MRI revealed 12 mm focus of reduced diffusion in right occipital lobe, probably acute/early subacute infarct. No hemorrhage or mass effect. Moderate chronic microvascular ischemic changes and moderate parenchymal volume loss of the brain       SLP Plan  Continue with current plan of care       Recommendations  Diet recommendations: Dysphagia 3 (mechanical soft);Thin liquid Liquids provided via: Cup Medication Administration: Whole meds with liquid Supervision: Patient able to self feed Postural Changes and/or Swallow Maneuvers: Seated upright 90 degrees                Oral Care Recommendations: Oral care BID Follow up Recommendations: Skilled Nursing facility  SLP Visit Diagnosis: Dysphagia, unspecified (R13.10) Plan: Continue with current plan of care       Ian [name] 10/28/2017, 9:28  AM

## 2017-10-28 NOTE — Progress Notes (Signed)
STROKE TEAM PROGRESS NOTE   HPI   Ian [name] is an 84 y.o. male  HTN admitted with a reported fall, possible syncopal event at home, then laying multiple hours (approximately 16 hours) laying on floor. Presented to hospital with Rhado, Leukocytosis and Multiple electrolyte and thyroid abnormalities. MRI reveals an acute Right occipital lobe infarct. Patient is complaining of dizziness and right sided weakness.  Wife states that at baseline, the patient has had "dementia" for the past few years. He has never had a formal Neurological evaluation for this. She states that he drinks at least 3-4 times per day, with his meals and in the evenings. His baseline personality, according to the wife, is very impatient and Type A personality. "He always likes to be in charge".  When he initially feel in the bathroom the wife attempted to help him up but the patient refused. She then went to bed and allowed him to lay on the floor until later the following day. Excess alcohol intake and family dynamics may have played a role in this unusual explanation.   Date last known well: Date: 10/25/2017 Time last known well: Time: 22:00 tPA Given: No: Outside the window Modified Rankin: Rankin Score=1 NIHSS: 0    SUBJECTIVE (INTERVAL HISTORY) His wife is at the bedside.  He is sitting up in a chair and eating his lunch. He seems to be doing better today. He denies any prior history of atrial fibrillation or cardiac arrhythmias. CT angiogram shows no large vessel stenosis or occlusion but he does have a small subdural hematoma hence antiplatelet therapy is on hold OBJECTIVE Vitals:   10/28/17 0400 10/28/17 0600 10/28/17 0850 10/28/17 1234  BP: (!) 152/93  121/75 99/68  Pulse: 86  85 99  Resp: 18  20 20   Temp: 98 F (36.7 C)   (!) 97.5 F (36.4 C)  TempSrc: Oral  Oral Oral  SpO2: 97%  91% 97%  Weight:  170 lb 3.1 oz (77.2 kg)    Height:        CBC:  Recent Labs  Lab 10/25/17 1503  10/27/17 1146  10/28/17 0929  WBC 15.6*   < > 14.2* 11.0*  NEUTROABS 13.8*  --   --   --   HGB 16.7   < > 13.6 12.9*  HCT 46.8   < > 40.4 38.4*  MCV 97.1   < > 99.8 99.7  PLT 215   < > 178 170   < > = values in this interval not displayed.    Basic Metabolic Panel:  Recent Labs  Lab 10/25/17 1921  10/27/17 1146 10/28/17 0929  NA  --    < > 138 138  K  --    < > 3.2* 3.5  CL  --    < > 104 106  CO2  --    < > 23 24  GLUCOSE  --    < > 116* 117*  BUN  --    < > 14 16  CREATININE  --    < > 0.86 0.76  CALCIUM  --    < > 10.2 10.3  MG 1.1*  --  1.2*  --    < > = values in this interval not displayed.    Lipid Panel:     Component Value Date/Time   CHOL 162 10/25/2017 2345   TRIG 66 10/25/2017 2345   HDL 86 10/25/2017 2345   CHOLHDL 1.9 10/25/2017 2345   VLDL 13  10/25/2017 2345   LDLCALC 63 10/25/2017 2345   HgbA1c:  Lab Results  Component Value Date   HGBA1C 4.8 10/25/2017   Urine Drug Screen:     Component Value Date/Time   LABOPIA NONE DETECTED 10/25/2017 1513   COCAINSCRNUR NONE DETECTED 10/25/2017 1513   LABBENZ NONE DETECTED 10/25/2017 1513   AMPHETMU NONE DETECTED 10/25/2017 1513   THCU NONE DETECTED 10/25/2017 1513   LABBARB NONE DETECTED 10/25/2017 1513    Alcohol Level     Component Value Date/Time   ETH <10 10/25/2017 1503    PHYSICAL EXAM Pleasant elderly Caucasian male sitting in bedside chair. Not in distress. He has multiple bruises from his recent fall. . Afebrile. Head is nontraumatic. Neck is supple without bruit.    Cardiac exam no murmur or gallop. Lungs are clear to auscultation. Distal pulses are well felt. Neurological Exam :  Patient alert and oriented to person. Diminished recall  follows simple commands. Speech clear. No aphasia. No dysarthria. Extraoccular movements intact. Visual fields full. Face symmetric. Tongue midline. Moves all extremities x 4. Strength normal. Coordination normal. Sensation intact.  Gait not tested. ASSESSMENT/PLAN Mr.  Ian [name] is a 84 y.o. male with no significant past medical history found down by wife with right-sided weakness and dizziness, presenting with rhabdomyolysis, leukocytosis and multiple electrolyte and thyroid abnormalities. He did not receive IV t-PA due to being outside the window.   Stroke:  right occipital embolic infarct, workup underway .Etiology of unexplained fall unclear as to syncopal event or arrhythmia or unwitnessed seizure  Resultant  right-sided weakness, not related to current stroke (but from lying on the floor)  CT no acute stroke. Soft tissue swelling in the scalp. Small vessel disease. multifocal mucosal thickening throughout the paranasal sinuses, left greater than right suggest chronic sinusitis.  MRI  right occipital acute/subacute infarct. Atrophy. Small vessel disease. Extensive paranasal sinus disease with air-fluid levels which may represent acute sinusitis  CTA Head and neck pending    Carotid Doppler  No B ICA stenosis, VAs antegrade   2D Echo  EF 65-70%. No source of embolus   EEG- diffuse slowing of the waking background  LDL 63. Started on Lipitor 40 by primary team  HgbA1c 4.8  Heparin 5000 units sq tid for VTE prophylaxis Fall precautions Diet full liquid Room service appropriate? Yes; Fluid consistency: Thin  aspirin 81 mg daily prior to admission, now on aspirin 81 mg daily and clopidogrel 75 mg daily. Plans for dual antiplatelets 3 weeks then plavix alone  Therapy recommendations:  CIR  Disposition:  pending   Syncope  Likely multifactorial   Hypertension   Permissive hypertension  Okay to resume home medications   Other Stroke Risk Factors  Advanced age  42-4x/d ETOH use. At risk for atrial fibrillation given etoh use   Other Active Problems  Rhabdomyolysis   Transaminitis   AKI resolved   Hip and shoulder x-ray negative, CXR neg  memory loss - check B-12 (ordered), TSH (9.181), RPR (ordered) and EEG (diffuse  slowing)  Leukocytosis, 18->14.2  hypothyroid, on Synthroid  Elevated troponin, mild  Recommend avoiding dextrose-containing solutions with acute stroke if possible  Hospital day # 3  I have personally examined this patient, reviewed notes, independently viewed imaging studies, participated in medical decision making and plan of care.ROS completed by me personally and pertinent positives fully documented  I have made any additions or clarifications directly to the above note. . I had a long discussion with the patient  and his wife at the bedside regarding his unexplained fall and small occipital infarct which is embolic and stroke workup is ensuing. Strong suspicion for  AFIb . Discussed with electrophysiology team physician assistant Debroah Baller Greater than 50% time during this 25 minute visit was spent on counseling and coordination of care about his embolic stroke, fall and answered questions  Antony Contras, [place] Pager: [phone] 10/28/2017 2:29 PM  To contact Stroke Continuity provider, please refer to [email or web address] After hours, contact General Neurology

## 2017-10-28 NOTE — Progress Notes (Signed)
Inpatient Rehabilitation  Have initiated insurance authorization for a potential IP Rehab admission tomorrow, 10/29/17. Plan to follow up with patient and/or spouse later today.  Will update team when I have an insurance decision.  Call if questions.   Carmelia Roller., CCC/SLP Admission Coordinator  [facility]  Cell [phone]

## 2017-10-28 NOTE — Progress Notes (Addendum)
Family Medicine Teaching Service Daily Progress Note Intern Pager: [phone]  Patient name: Ian [name] Medical record number: [id number] Date of birth: [birth year] Age: 84 y.o. Gender: male  Primary Care Provider: Bernerd Limbo, MD Consultants: Neurology Code Status: Full  Pt Overview and Major Events to Date:  Admitted to [facility] on 3/2 for syncope, rhabdomyolysis  Assessment and Plan: Ian [name] a 84 y.o.malepresenting with episode of syncope, unwitnessed and subsequent rhabdomyolysis. PMH is significant forhypertensionand mild cognitive impairment.  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 CTA head/neck showing minimal bifrontal subarachnoid hemorrhage and possible tiny subdural. Cardiac etiology less likely given no T- STchanges noted oninitialEKG and no chest pain, dyspneaor history of palpitations and no known cardiac history. Seizure additionally considered given blood in the patient's mouth, although no known h/o seizure activity and no witnessed seizure activity and EEG showing diffuse non specific slowing.Echo showing LVEF 65-70%, G1DD, with no regional wall motion abnormalities  - Neurology consult, appreciate recommendations: per Dr. Leonie Man will hold ASA/plavix for 3/5 and 3/6. Repeat CT head on 3/6, if blood is isodense, restart ASA/plavix - will continue statin - cardiology consulted for outpatient loop recorder monitoring -Frequent neuro checks - EKG pending - PT/OT/SLP: recommend CIR  - full liquid diet per SLP swallow eval   Bifrontal subarachnoid hemorrhage/tiny subdural  CTA head/neck showing minimal bifrontal subarachnoid hemorrhage and possible tiny subdural. Neurology consulted and recommend repeat CT on 3/6.  -Neurology consulted appreciate recommendations, CT head showing minimal bifrontal subarachnoid hemorrhage and possible tiny subdural.   -continue to monitor   AMS-improving  Worse than baseline with last Mini Mental status exam score of 21/30 in August 2018. Appears to have new stroke and subarrachnoid hemorrhage. Ethanol and UDS negative.  - ordered sitter - may need to consider haldol but had prolonged QT on last EKG  Eevated troponin - stable  i-STAT troponin 0.11 in the emergency department. Troponin I trended at 0.13>0.19>0.17. No chest pain. Troponins may be elevated in the setting of acute [place] vs. Demand ischemia.EKG on admission negative for acute changes. - permissive hypertension - repeat EKG pending - echo as noted above - risk start with lipid panel, TSH, A1c--> A1c 4.8; total cholesterol 162, HDL 86, LDL 63  Rhabdomyolysis - improving CK downtrending, 5905>2067>825>377. Patient down for roughly 10 hours. LA 4.with downward trended of 5.64>4.82>2.1.UOP of 234mL in 24hrs.  - will need to monitor UOP closely, per nursing staff patient is incontinent and that is why no urine is charted - if inadequate urine output can try and bladder scan or place condom cath - strict Is and Os  AKI- Resolved Cr of 0.76, unknown baseline. Likely prerenal in setting of being found down after several hours. - monitor on AM BMP  HTN Home meds include norvasc and HCTZ. Currently 152/93 - permissive HTN till SBP > 220 - will plan to introduce HTN medications on 3/6   Transaminitis  AST elevated at 140 with ALT 39. Patient endorses daily alcohol use with a beer every day at lunch and a cocktail at dinner - monitor on CIWA, overnight scores of 0  Hypothyroidism TSH elevated to 9.181. Free T4 0.77 -continue synthroid mcg  Aneurysmal ascending aorta Incidental finding on CTA neck. 4.3 cm aneurysmal ascending aorta. Recommended imaging follow up by CTA or MRA. -will call CVTS for consult   Thyroid nodule 39mm complex thyroid nodule found incidentally on CTA neck. Patient is s/p probable right  thyroidectomy.  -  recommend thyroid US as outpatient   FEN/GI:Full liquid diet, D5 NS @120  cc/hr  Prophylaxis:SCDs   Disposition: will discharge to CIR when medically stable  Subjective:  Patient today feeling well. Denies CP or SOB. Reports some shoulder pain but improved. States he feels ready for discharge.   Objective: Temp:  [97.5 F (36.4 C)-98 F (36.7 C)] 97.5 F (36.4 C) (03/05 1234) Pulse Rate:  [84-99] 99 (03/05 1234) Resp:  [18-20] 20 (03/05 1234) BP: (99-152)/(68-93) 99/68 (03/05 1234) SpO2:  [91 %-99 %] 97 % (03/05 1234) Weight:  [170 lb 3.1 oz (77.2 kg)] 170 lb 3.1 oz (77.2 kg) (03/05 0600) Physical Exam: General: awake and alert, NAD, sitting up in bed watching television  Cardiovascular: RRR, no MRG Respiratory: CTAB, no wheezes, rales, or rhonchi  Abdomen: soft, non tender, non distended, bowel sounds normal  Extremities: minimal tenderness in legs bilaterally, some tenderness to palpation in right shoulder, left shoulder non tender, no edema   Laboratory: Recent Labs  Lab 10/26/17 0801 10/27/17 1146 10/28/17 0929  WBC 18.0* 14.2* 11.0*  HGB 14.5 13.6 12.9*  HCT 41.4 40.4 38.4*  PLT 169 178 170   Recent Labs  Lab 10/25/17 1503  10/26/17 0801 10/27/17 1146 10/28/17 0929  NA 138   < > 138 138 138  K 3.7   < > 2.9* 3.2* 3.5  CL 101   < > 105 104 106  CO2 22  --  23 23 24   BUN 24*   < > 22* 14 16  CREATININE 1.32*   < > 1.01 0.86 0.76  CALCIUM 12.0*  --  10.9* 10.2 10.3  PROT 7.7  --   --   --  5.7*  BILITOT 1.8*  --   --   --  1.8*  ALKPHOS 66  --   --   --  67  ALT 39  --   --   --  45  AST 140*  --   --   --  73*  GLUCOSE 186*   < > 101* 116* 117*   < > = values in this interval not displayed.    Imaging/Diagnostic Tests: Ct Angio Head W Or Wo Contrast  Result Date: 10/27/2017 CLINICAL DATA:  Follow-up stroke. EXAM: CT ANGIOGRAPHY HEAD AND NECK TECHNIQUE: Multidetector CT imaging of the head and neck was performed using the standard  protocol during bolus administration of intravenous contrast. Multiplanar CT image reconstructions and MIPs were obtained to evaluate the vascular anatomy. Carotid stenosis measurements (when applicable) are obtained utilizing NASCET criteria, using the distal internal carotid diameter as the denominator. CONTRAST:  25mL ISOVUE-370 IOPAMIDOL (ISOVUE-370) INJECTION 76% COMPARISON:  MRI of the head October 25, 2017 FINDINGS: CT HEAD FINDINGS BRAIN: No intraparenchymal hemorrhage, mass effect nor midline shift. The ventricles and sulci are normal for age. Confluent supratentorial white matter hypodensities. Old small LEFT cerebellar infarcts. No acute large vascular territory infarcts. Faint density bilateral frontal extra-axial spaces with indistinctness. Basal cisterns are patent. VASCULAR: Moderate to severe calcific atherosclerosis of the carotid siphons. SKULL: No skull fracture. No significant scalp soft tissue swelling. SINUSES/ORBITS: Chronic severe LEFT maxillary sinusitis and chronic moderate LEFT sphenoid sinusitis. LEFT greater than RIGHT ethmoid mucosal thickening. Mastoid air cells are well aerated.The included ocular globes and orbital contents are non-suspicious. OTHER: None. CTA NECK FINDINGS: AORTIC ARCH: 4.3 cm ascending aorta. Moderate calcific atherosclerosis aortic arch and, intimal thickening. The origins of the innominate, left Common carotid artery and subclavian artery are widely  patent. Moderate eccentric intimal thickening calcific atherosclerosis proximal LEFT subclavian artery. RIGHT CAROTID SYSTEM: Common carotid artery is widely patent, mildly tortuous course. Moderate eccentric calcific atherosclerosis carotid bifurcation without hemodynamically significant stenosis by NASCET criteria. Mild distal cervical ectasia without dissection or pseudoaneurysm. LEFT CAROTID SYSTEM: Common carotid artery is widely patent, mildly tortuous course. Moderate eccentric calcific atherosclerosis carotid  bifurcation without hemodynamically significant stenosis by NASCET criteria. Mild distal cervical ectasia without dissection or pseudoaneurysm. VERTEBRAL ARTERIES:Left vertebral artery is dominant. Normal appearance of the vertebral arteries, widely patent. SKELETON: Nonacute. 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 Grade 1 C3-4 anterolisthesis. Severe RIGHT greater than LEFT C3-4, bilateral C5-6 and RIGHT C6-7 neural foraminal narrowing. OTHER NECK: Soft tissues of the neck are nonacute though, not tailored for evaluation. Surgical clips RIGHT thyroid, however there is a 19 mm complex RIGHT thyroid nodule. UPPER CHEST: Included lung apices are clear. Small calcified mediastinal lymph nodes. No superior mediastinal lymphadenopathy. CTA HEAD FINDINGS: ANTERIOR CIRCULATION: Patent cervical internal carotid arteries, petrous, cavernous and supra clinoid internal carotid arteries. Calcific atherosclerosis results in mild stenosis bilateral supraclinoid internal carotid arteries. Patent anterior communicating artery. Patent anterior and middle cerebral arteries, mild luminal irregularity compatible with atherosclerosis. No large vessel occlusion, significant stenosis, contrast extravasation or aneurysm. POSTERIOR CIRCULATION: Patent vertebral arteries, vertebrobasilar junction and basilar artery, as well as main branch vessels. Patent posterior cerebral arteries. Focal severe stenosis RIGHT P3 segment. Tiny bilateral posterior communicating arteries present. No large vessel occlusion, contrast extravasation or aneurysm. VENOUS SINUSES: Major dural venous sinuses are patent though not tailored for evaluation on this angiographic examination. ANATOMIC VARIANTS: None. DELAYED PHASE: No abnormal intracranial enhancement. MIP images reviewed. IMPRESSION: CT HEAD: 1. Minimal bifrontal subarachnoid hemorrhage, possible tiny subdural hematomas. 2.  Moderate to severe chronic small vessel ischemic disease. Old small LEFT cerebellar infarct. CTA NECK: 1. No hemodynamically significant stenosis or acute vascular process in the neck. 2. Status post probable RIGHT thyroidectomy with complex 19 mm RIGHT thyroid nodule. Recommend thyroid sonogram on nonemergent basis. This follows ACR consensus guidelines: Managing Incidental Thyroid Nodules Detected on Imaging: White Paper of the ACR Incidental Thyroid Findings Committee. J Am Coll Radiol 2015; 12:143-150. 3. **An incidental finding of potential clinical significance has been found. 4.3 cm aneurysmal ascending aorta. Recommend annual imaging followup by CTA or MRA. This recommendation follows 2010 ACCF/AHA/AATS/ACR/ASA/SCA/SCAI/SIR/STS/SVM Guidelines for the Diagnosis and Management of Patients with Thoracic Aortic Disease. Circulation. 2010; 121: A213-Y865** 4. Multilevel severe neural foraminal narrowing and moderate craniocervical canal stenosis. CTA HEAD: 1. No emergent large vessel occlusion. Severe stenosis RIGHT P3 segment. 2. Mild stenosis bilateral supraclinoid internal carotid arteries. Electronically Signed   By: Elon Alas M.D.   On: 10/27/2017 15:45   Dg Shoulder Right  Result Date: 10/25/2017 CLINICAL DATA:  Pain after fall. EXAM: RIGHT SHOULDER - 2+ VIEW COMPARISON:  None. FINDINGS: There is no evidence of fracture or dislocation. There is no evidence of arthropathy or other focal bone abnormality. Soft tissues are unremarkable. IMPRESSION: Negative. Electronically Signed   By: Dorise Bullion III M.D   On: 10/25/2017 16:42   Ct Head Wo Contrast  Addendum Date: 10/25/2017   ADDENDUM REPORT: 10/25/2017 18:09 ADDENDUM: Omitted from the original discussion is the presence of multifocal mucosal thickening throughout the paranasal sinuses, most evident in the ethmoid sinuses bilaterally, sphenoid sinus and in the maxillary sinuses (left greater than right). Inspissated secretions are noted in  the left maxillary sinus. These  findings suggest chronic sinusitis. Electronically Signed   By: Vinnie Langton M.D.   On: 10/25/2017 18:09   Result Date: 10/25/2017 CLINICAL DATA:  84 year old male with history of trauma from a fall to the floor yesterday evening at 10 p.m. Right-sided weakness. EXAM: CT HEAD WITHOUT CONTRAST TECHNIQUE: Contiguous axial images were obtained from the base of the skull through the vertex without intravenous contrast. COMPARISON:  None. FINDINGS: Brain: Moderate cerebral and cerebellar atrophy. Patchy and confluent areas of decreased attenuation are noted throughout the deep and periventricular white matter of the cerebral hemispheres bilaterally, compatible with chronic microvascular ischemic disease. No evidence of acute infarction, hemorrhage, hydrocephalus, extra-axial collection or mass lesion/mass effect. Vascular: No hyperdense vessel or unexpected calcification. Skull: Normal. Negative for fracture or focal lesion. Sinuses/Orbits: No acute finding. Other: Soft tissue swelling in the scalp near the vertex. IMPRESSION: 1. Soft tissue swelling in the scalp near the vertex, presumably a hematoma. No acute intracranial abnormalities. 2. Moderate cerebral and cerebellar atrophy with extensive chronic microvascular ischemic changes in the cerebral white matter. Electronically Signed: By: Vinnie Langton M.D. On: 10/25/2017 17:01   Ct Angio Neck W Or Wo Contrast  Result Date: 10/27/2017 CLINICAL DATA:  Follow-up stroke. EXAM: CT ANGIOGRAPHY HEAD AND NECK TECHNIQUE: Multidetector CT imaging of the head and neck was performed using the standard protocol during bolus administration of intravenous contrast. Multiplanar CT image reconstructions and MIPs were obtained to evaluate the vascular anatomy. Carotid stenosis measurements (when applicable) are obtained utilizing NASCET criteria, using the distal internal carotid diameter as the denominator. CONTRAST:  25mL ISOVUE-370  IOPAMIDOL (ISOVUE-370) INJECTION 76% COMPARISON:  MRI of the head October 25, 2017 FINDINGS: CT HEAD FINDINGS BRAIN: No intraparenchymal hemorrhage, mass effect nor midline shift. The ventricles and sulci are normal for age. Confluent supratentorial white matter hypodensities. Old small LEFT cerebellar infarcts. No acute large vascular territory infarcts. Faint density bilateral frontal extra-axial spaces with indistinctness. Basal cisterns are patent. VASCULAR: Moderate to severe calcific atherosclerosis of the carotid siphons. SKULL: No skull fracture. No significant scalp soft tissue swelling. SINUSES/ORBITS: Chronic severe LEFT maxillary sinusitis and chronic moderate LEFT sphenoid sinusitis. LEFT greater than RIGHT ethmoid mucosal thickening. Mastoid air cells are well aerated.The included ocular globes and orbital contents are non-suspicious. OTHER: None. CTA NECK FINDINGS: AORTIC ARCH: 4.3 cm ascending aorta. Moderate calcific atherosclerosis aortic arch and, intimal thickening. The origins of the innominate, left Common carotid artery and subclavian artery are widely patent. Moderate eccentric intimal thickening calcific atherosclerosis proximal LEFT subclavian artery. RIGHT CAROTID SYSTEM: Common carotid artery is widely patent, mildly tortuous course. Moderate eccentric calcific atherosclerosis carotid bifurcation without hemodynamically significant stenosis by NASCET criteria. Mild distal cervical ectasia without dissection or pseudoaneurysm. LEFT CAROTID SYSTEM: Common carotid artery is widely patent, mildly tortuous course. Moderate eccentric calcific atherosclerosis carotid bifurcation without hemodynamically significant stenosis by NASCET criteria. Mild distal cervical ectasia without dissection or pseudoaneurysm. VERTEBRAL ARTERIES:Left vertebral artery is dominant. Normal appearance of the vertebral arteries, widely patent. SKELETON: Nonacute. Calcified pannus about the odontoid process most compatible  with CPPD resulting in moderate craniocervical stenosis. Grade 1 C3-4 anterolisthesis. Severe RIGHT greater than LEFT C3-4, bilateral C5-6 and RIGHT C6-7 neural foraminal narrowing. OTHER NECK: Soft tissues of the neck are nonacute though, not tailored for evaluation. Surgical clips RIGHT thyroid, however there is a 19 mm complex RIGHT thyroid nodule. UPPER CHEST: Included lung apices are clear. Small calcified mediastinal lymph nodes. No superior mediastinal lymphadenopathy. CTA HEAD FINDINGS: ANTERIOR CIRCULATION: Patent cervical  internal carotid arteries, petrous, cavernous and supra clinoid internal carotid arteries. Calcific atherosclerosis results in mild stenosis bilateral supraclinoid internal carotid arteries. Patent anterior communicating artery. Patent anterior and middle cerebral arteries, mild luminal irregularity compatible with atherosclerosis. No large vessel occlusion, significant stenosis, contrast extravasation or aneurysm. POSTERIOR CIRCULATION: Patent vertebral arteries, vertebrobasilar junction and basilar artery, as well as main branch vessels. Patent posterior cerebral arteries. Focal severe stenosis RIGHT P3 segment. Tiny bilateral posterior communicating arteries present. No large vessel occlusion, contrast extravasation or aneurysm. VENOUS SINUSES: Major dural venous sinuses are patent though not tailored for evaluation on this angiographic examination. ANATOMIC VARIANTS: None. DELAYED PHASE: No abnormal intracranial enhancement. MIP images reviewed. IMPRESSION: CT HEAD: 1. Minimal bifrontal subarachnoid hemorrhage, possible tiny subdural hematomas. 2. Moderate to severe chronic small vessel ischemic disease. Old small LEFT cerebellar infarct. CTA NECK: 1. No hemodynamically significant stenosis or acute vascular process in the neck. 2. Status post probable RIGHT thyroidectomy with complex 19 mm RIGHT thyroid nodule. Recommend thyroid sonogram on nonemergent basis. This follows ACR  consensus guidelines: Managing Incidental Thyroid Nodules Detected on Imaging: White Paper of the ACR Incidental Thyroid Findings Committee. J Am Coll Radiol 2015; 12:143-150. 3. **An incidental finding of potential clinical significance has been found. 4.3 cm aneurysmal ascending aorta. Recommend annual imaging followup by CTA or MRA. This recommendation follows 2010 ACCF/AHA/AATS/ACR/ASA/SCA/SCAI/SIR/STS/SVM Guidelines for the Diagnosis and Management of Patients with Thoracic Aortic Disease. Circulation. 2010; 121: F643-P295** 4. Multilevel severe neural foraminal narrowing and moderate craniocervical canal stenosis. CTA HEAD: 1. No emergent large vessel occlusion. Severe stenosis RIGHT P3 segment. 2. Mild stenosis bilateral supraclinoid internal carotid arteries. Electronically Signed   By: Elon Alas M.D.   On: 10/27/2017 15:45   Mr Jeri Cos JO Contrast  Result Date: 10/25/2017 CLINICAL DATA:  84 y/o M; found on bathroom floor last night at 2200 hours. Right-sided weakness and right-sided neglect. EXAM: MRI HEAD WITHOUT AND WITH CONTRAST TECHNIQUE: Multiplanar, multiecho pulse sequences of the brain and surrounding structures were obtained without and with intravenous contrast. CONTRAST:  4mL MULTIHANCE GADOBENATE DIMEGLUMINE 529 MG/ML IV SOLN COMPARISON:  10/25/2017 CT head FINDINGS: Brain: 12 mm focus of reduced diffusion within the right occipital lobe (series 2, image 25 and series 5, image 2) compatible with acute/early subacute infarction. No hemorrhage or mass effect. Patchy confluentnonspecific foci of T2 FLAIR hyperintense signal abnormality in subcortical and periventricular white matter are compatible withmoderatechronic microvascular ischemic changes for age. Small chronic lacunar infarct within the left thalamus. Moderatebrain parenchymal volume loss. Motion degraded SWI sequence, no gross hemorrhage on GRE sequence. After administration of intravenous contrast there is thin diffuse  smooth pachymeningeal enhancement. No brain parenchymal enhancement. Vascular: Normal flow voids. Skull and upper cervical spine: Soft tissue pannus surrounding odontoid process with mild cervical spinal canal stenosis may be degenerative, due to rheumatoid disease, or crystal deposition disease. Mild scalp soft tissue thickening over the vertex may represent a contusion. No calvarial bone marrow edema. Sinuses/Orbits: Sphenoid, left ethmoid, and left mastoid opacification, fluid levels, and diffuse paranasal sinus mucosal thickening. No abnormal signal of mastoid air cells. Orbits are unremarkable. Other: None. IMPRESSION: 1. 12 mm focus of reduced diffusion in right occipital lobe, probably acute/early subacute infarct. No hemorrhage or mass effect. 2. Thin smooth diffuse pachymeningeal thickening without nodularity. This can be seen in the setting of head trauma or meningitis. No additional findings for differential considerations of intracranial hypotension, mass infiltration, granulomatous disease, or hypertrophic pachymeningitis. 3. Moderate chronic microvascular ischemic changes  and moderate parenchymal volume loss of the brain. 4. Extensive paranasal sinus disease with fluid levels which may represent acute sinusitis. These results will be called to the ordering clinician or representative by the Radiologist Assistant, and communication documented in the PACS or zVision Dashboard. Electronically Signed   By: Kristine Garbe M.D.   On: 10/25/2017 22:43   Dg Chest Port 1 View  Result Date: 10/25/2017 CLINICAL DATA:  Right-sided weakness, right-sided neglect EXAM: PORTABLE CHEST 1 VIEW COMPARISON:  01/25/2010 FINDINGS: There is no focal parenchymal opacity. There is no pleural effusion or pneumothorax. The heart and mediastinal contours are unremarkable. There is mild osteoarthritis of bilateral glenohumeral joints. IMPRESSION: No active disease. Electronically Signed   By: Kathreen Devoid   On:  10/25/2017 15:55   Dg Hips Bilat With Pelvis 3-4 Views  Result Date: 10/25/2017 CLINICAL DATA:  Pain after fall EXAM: DG HIP (WITH OR WITHOUT PELVIS) 3-4V BILAT COMPARISON:  None. FINDINGS: There is no evidence of hip fracture or dislocation. There is no evidence of arthropathy or other focal bone abnormality. IMPRESSION: Negative. Electronically Signed   By: Dorise Bullion III M.D   On: 10/25/2017 16:46   EEG Impression: This awake and drowsy EEG is abnormal due to diffuse slowing of the waking background. Clinical Correlation of the above findings indicates diffuse cerebral dysfunction that is non-specific in etiology that can due to excessive drowsiness but also can be seen with hypoxic/ischemic injury, toxic/metabolic encephalopathies, neurodegenerative disorders, or medication effect.  Clinical correlation is advised. Metta Clines, DO   Caroline More, DO 10/28/2017, 1:26 PM PGY-1, [facility] Intern pager: [phone], text pages welcome

## 2017-10-29 ENCOUNTER — Inpatient Hospital Stay (HOSPITAL_COMMUNITY)
Admission: RE | Admit: 2017-10-29 | Discharge: 2017-11-09 | DRG: 057 | Disposition: A | Discharge status: Other Acute Inpatient Hospital | Payer: Medicare Other | Source: Intra-hospital | Attending: Physical Medicine & Rehabilitation | Admitting: Physical Medicine & Rehabilitation

## 2017-10-29 ENCOUNTER — Encounter (HOSPITAL_COMMUNITY): Payer: Self-pay | Admitting: Nurse Practitioner

## 2017-10-29 ENCOUNTER — Inpatient Hospital Stay (HOSPITAL_COMMUNITY): Payer: Medicare Other

## 2017-10-29 DIAGNOSIS — I69391 Dysphagia following cerebral infarction: Secondary | ICD-10-CM | POA: Diagnosis not present

## 2017-10-29 DIAGNOSIS — Z7982 Long term (current) use of aspirin: Secondary | ICD-10-CM

## 2017-10-29 DIAGNOSIS — I5030 Unspecified diastolic (congestive) heart failure: Secondary | ICD-10-CM | POA: Diagnosis present

## 2017-10-29 DIAGNOSIS — F0390 Unspecified dementia without behavioral disturbance: Secondary | ICD-10-CM

## 2017-10-29 DIAGNOSIS — E039 Hypothyroidism, unspecified: Secondary | ICD-10-CM | POA: Diagnosis present

## 2017-10-29 DIAGNOSIS — I248 Other forms of acute ischemic heart disease: Secondary | ICD-10-CM | POA: Diagnosis present

## 2017-10-29 DIAGNOSIS — I69351 Hemiplegia and hemiparesis following cerebral infarction affecting right dominant side: Secondary | ICD-10-CM | POA: Diagnosis present

## 2017-10-29 DIAGNOSIS — T796XXS Traumatic ischemia of muscle, sequela: Secondary | ICD-10-CM | POA: Diagnosis not present

## 2017-10-29 DIAGNOSIS — Z79899 Other long term (current) drug therapy: Secondary | ICD-10-CM

## 2017-10-29 DIAGNOSIS — I10 Essential (primary) hypertension: Secondary | ICD-10-CM | POA: Diagnosis not present

## 2017-10-29 DIAGNOSIS — K922 Gastrointestinal hemorrhage, unspecified: Secondary | ICD-10-CM | POA: Diagnosis not present

## 2017-10-29 DIAGNOSIS — I63431 Cerebral infarction due to embolism of right posterior cerebral artery: Secondary | ICD-10-CM | POA: Diagnosis not present

## 2017-10-29 DIAGNOSIS — R609 Edema, unspecified: Secondary | ICD-10-CM | POA: Diagnosis not present

## 2017-10-29 DIAGNOSIS — I519 Heart disease, unspecified: Secondary | ICD-10-CM | POA: Diagnosis not present

## 2017-10-29 DIAGNOSIS — I110 Hypertensive heart disease with heart failure: Secondary | ICD-10-CM | POA: Diagnosis present

## 2017-10-29 DIAGNOSIS — M6282 Rhabdomyolysis: Secondary | ICD-10-CM | POA: Diagnosis present

## 2017-10-29 DIAGNOSIS — R1310 Dysphagia, unspecified: Secondary | ICD-10-CM | POA: Diagnosis present

## 2017-10-29 DIAGNOSIS — G8191 Hemiplegia, unspecified affecting right dominant side: Secondary | ICD-10-CM | POA: Diagnosis not present

## 2017-10-29 DIAGNOSIS — I6340 Cerebral infarction due to embolism of unspecified cerebral artery: Secondary | ICD-10-CM | POA: Diagnosis present

## 2017-10-29 HISTORY — DX: Essential (primary) hypertension: I10

## 2017-10-29 HISTORY — DX: Cerebral infarction, unspecified: I63.9

## 2017-10-29 LAB — BASIC METABOLIC PANEL
Anion gap: 9 (ref 5–15)
BUN: 16 mg/dL (ref 6–20)
CO2: 21 mmol/L — ABNORMAL LOW (ref 22–32)
Calcium: 10 mg/dL (ref 8.9–10.3)
Chloride: 105 mmol/L (ref 101–111)
Creatinine, Ser: 0.75 mg/dL (ref 0.61–1.24)
GFR calc Af Amer: >60 mL/min (ref >60)
GFR calc non Af Amer: >60 mL/min (ref >60)
Glucose, Bld: 89 mg/dL (ref 65–99)
Potassium: 3.5 mmol/L (ref 3.5–5.1)
Sodium: 135 mmol/L (ref 135–145)

## 2017-10-29 LAB — CBC
HCT: 34.5 % — ABNORMAL LOW (ref 39.0–52.0)
Hemoglobin: 11.9 g/dL — ABNORMAL LOW (ref 13.0–17.0)
MCH: 34 pg (ref 26.0–34.0)
MCHC: 34.5 g/dL (ref 30.0–36.0)
MCV: 98.6 fL (ref 78.0–100.0)
Platelets: 180 10*3/uL (ref 150–400)
RBC: 3.5 MIL/uL — ABNORMAL LOW (ref 4.22–5.81)
RDW: 13.2 % (ref 11.5–15.5)
WBC: 9.8 10*3/uL (ref 4.0–10.5)

## 2017-10-29 LAB — RPR: RPR Ser Ql: NONREACTIVE

## 2017-10-29 MED ORDER — CLOPIDOGREL BISULFATE 75 MG PO TABS: 75.0000 mg | ORAL_TABLET | Freq: Every day | ORAL | 0 refills | Status: DC

## 2017-10-29 MED ORDER — ASPIRIN 81 MG PO CHEW: 81.0000 mg | CHEWABLE_TABLET | Freq: Every day | ORAL | Status: DC

## 2017-10-29 MED ORDER — CLOPIDOGREL BISULFATE 75 MG PO TABS: 75.0000 mg | ORAL_TABLET | Freq: Every day | ORAL | Status: DC

## 2017-10-29 MED ORDER — NITROGLYCERIN 0.4 MG SL SUBL: 0.4000 mg | SUBLINGUAL_TABLET | SUBLINGUAL | Status: DC | PRN

## 2017-10-29 MED ORDER — ALPRAZOLAM 0.25 MG PO TABS
0.2500 mg | ORAL_TABLET | ORAL | Status: DC | PRN
  Administered 2017-10-30 – 2017-11-01 (×2): 0.25 mg via ORAL
  Filled 2017-10-29 (×4): qty 1

## 2017-10-29 MED ORDER — AMLODIPINE BESYLATE 10 MG PO TABS
10.0000 mg | ORAL_TABLET | Freq: Every day | ORAL | Status: DC
  Administered 2017-10-30 – 2017-11-08 (×10): 10 mg via ORAL
  Filled 2017-10-29 (×10): qty 1

## 2017-10-29 MED ORDER — ONDANSETRON HCL 4 MG PO TABS: 4.0000 mg | ORAL_TABLET | Freq: Four times a day (QID) | ORAL | Status: DC | PRN

## 2017-10-29 MED ORDER — ASPIRIN 81 MG PO CHEW: 81.0000 mg | CHEWABLE_TABLET | Freq: Every day | ORAL | 0 refills | Status: DC

## 2017-10-29 MED ORDER — ATORVASTATIN CALCIUM 40 MG PO TABS
40.0000 mg | ORAL_TABLET | Freq: Every day | ORAL | Status: DC
  Administered 2017-10-30 – 2017-11-08 (×10): 40 mg via ORAL
  Filled 2017-10-29 (×11): qty 1

## 2017-10-29 MED ORDER — LEVOTHYROXINE SODIUM 50 MCG PO TABS
25.0000 ug | ORAL_TABLET | Freq: Every day | ORAL | Status: DC
  Administered 2017-10-30 – 2017-11-08 (×10): 25 ug via ORAL
  Filled 2017-10-29 (×10): qty 1

## 2017-10-29 MED ORDER — NITROGLYCERIN 0.4 MG SL SUBL: 0.4000 mg | SUBLINGUAL_TABLET | SUBLINGUAL | 0 refills | Status: DC | PRN

## 2017-10-29 MED ORDER — ATORVASTATIN CALCIUM 40 MG PO TABS: 40.0000 mg | ORAL_TABLET | Freq: Every day | ORAL | 0 refills | Status: DC

## 2017-10-29 MED ORDER — IOPAMIDOL (ISOVUE-370) INJECTION 76%
INTRAVENOUS | Status: AC
  Administered 2017-10-29: 50 mL
  Filled 2017-10-29: qty 50

## 2017-10-29 MED ORDER — ASPIRIN 81 MG PO CHEW
81.0000 mg | CHEWABLE_TABLET | Freq: Every day | ORAL | Status: DC
  Administered 2017-10-30 – 2017-11-08 (×10): 81 mg via ORAL
  Filled 2017-10-29 (×10): qty 1

## 2017-10-29 MED ORDER — LEVOTHYROXINE SODIUM 25 MCG PO TABS: 25.0000 ug | ORAL_TABLET | Freq: Every day | ORAL | 0 refills | Status: DC

## 2017-10-29 MED ORDER — CLOPIDOGREL BISULFATE 75 MG PO TABS
75.0000 mg | ORAL_TABLET | Freq: Every day | ORAL | Status: DC
  Administered 2017-10-30 – 2017-11-08 (×10): 75 mg via ORAL
  Filled 2017-10-29 (×10): qty 1

## 2017-10-29 MED ORDER — SENNOSIDES-DOCUSATE SODIUM 8.6-50 MG PO TABS
1.0000 | ORAL_TABLET | Freq: Two times a day (BID) | ORAL | Status: DC
  Administered 2017-10-30 – 2017-11-08 (×19): 1 via ORAL
  Filled 2017-10-29 (×20): qty 1

## 2017-10-29 MED ORDER — ONDANSETRON HCL 4 MG/2ML IJ SOLN
4.0000 mg | Freq: Four times a day (QID) | INTRAMUSCULAR | Status: DC | PRN
  Administered 2017-11-08: 4 mg via INTRAVENOUS
  Filled 2017-10-29: qty 2

## 2017-10-29 MED ORDER — BISACODYL 10 MG RE SUPP: 10.0000 mg | Freq: Every day | RECTAL | Status: DC | PRN

## 2017-10-29 MED ORDER — FLUTICASONE PROPIONATE 50 MCG/ACT NA SUSP
2.0000 | Freq: Every day | NASAL | Status: DC
  Administered 2017-10-30 – 2017-11-07 (×7): 2 via NASAL
  Filled 2017-10-29: qty 16

## 2017-10-29 MED ORDER — SORBITOL 70 % SOLN
30.0000 mL | Freq: Every day | Status: DC | PRN
  Administered 2017-11-04 (×2): 30 mL via ORAL
  Filled 2017-10-29 (×2): qty 30

## 2017-10-29 NOTE — Progress Notes (Addendum)
Family Medicine Teaching Service Daily Progress Note Intern Pager: [phone]  Patient name: Ian [name] Medical record number: [id number] Date of birth: [birth year] Age: 84 y.o. Gender: male  Primary Care Provider: Bernerd Limbo, MD Consultants: Neurology  Code Status: Full  Pt Overview and Major Events to Date:  Admitted to [facility] on 2/3 for syncope, rhabdomyolysis   Assessment and Plan: Ian [name] a 84 y.o.malepresenting with episode of syncope, unwitnessed and subsequent rhabdomyolysis. PMH is significant forhypertensionand mild cognitive impairment.  Acute/subacute infarct in right occipital lobe MRI showing acute/subacute infart in right occipital lobe giving likely etiology if syncopal episode  - Neurology consult, appreciate recommendations: per Dr. Leonie [name] will hold ASA/plavix for 3/5 and 3/6 due to subarachnoid hemorroage. Repeat CT head on 3/6, if blood is isodense, restart ASA/plavix - will continue statin - cardiology consulted for outpatient loop recorder monitoring -Frequent neuro checks - PT/OT/SLP: recommend CIR  - full liquid diet per SLP swallow eval  Bifrontal subarachnoid hemorrhage/tiny subdural  CTA head/neck on 3/4 showing minimal bifrontal subarachnoid hemorrhage and possible tiny subdural. Repeat CT on 3/6 showing resolution of subarachnoid and subdural hemorrhage and no intracranial hemorrhage now identified with no other new acute intracranial process.  -Neurology consulted appreciate recommendations -restart ASA/plavix   -continue to monitor   AMS-improving Worse than baseline with last Mini Mental status exam score of 21/30 in August 2018. Appears to have new stroke and subarrachnoid hemorrhage. Ethanol and UDS negative.  - ordered sitter - may need to consider haldol but had prolonged QT on last EKG  Eevated troponin - stable  i-STAT troponin 0.11 in the emergency department.Troponin I trended at 0.13>0.19>0.17.No chest pain. Troponins  may be elevated in the setting of acute [place] vs. Demand ischemia.EKG on admissionnegative for acute changes.  Rhabdomyolysis - resolved CK downtrending, 5905>2067>825>377. Patient down for roughly 10 hours. LA 4.with downward trended of 5.64>4.82>2.1.UOP of 749mL in 24hrs.  - will need to monitor UOP closely  - if inadequate urine output can try and bladder scan or place condom cath - strict Is and Os  AKI-Resolved Cr of 0.75, unknown baseline. Likely prerenal in setting of being found down after several hours. - monitor on AM BMP  HTN Currently normotensive with BP 134/70. Home meds include norvasc 10mg  and HCTZ 25mg  -continue amlodipine 10mg   -can add HCTZ as BP permits   Transaminitis  AST elevated at 140 with ALT 39. Patient endorses daily alcohol use with a beer every day at lunch and a cocktail at dinner - monitor on CIWA, overnight scores of 1  Hypothyroidism TSH elevated to 9.181. Free T4 0.77 -continue synthroidmcg  Aneurysmal ascending aorta Incidental finding on CTA neck. 4.3 cm aneurysmal ascending aorta. Recommended imaging follow up by CTA or MRA. -CVTS consulted on 3/5, per CVTS patient will be seen as outpatient. CVTS will set up appointment and call patient to inform of time  Thyroid nodule 47mm complex thyroid nodule found incidentally on CTA neck. Patient is s/p probable right thyroidectomy.  -recommend thyroid US as outpatient   FEN/GI:Full liquid diet, D5 NS @120  cc/hr Prophylaxis:SCDs   Disposition: will likely be discharged to CIR when medically stable   Subjective:  Patient today very angry and does not want to participate in exam.   Objective: Temp:  [97.5 F (36.4 C)-98 F (36.7 C)] 98 F (36.7 C) (03/06 0817) Pulse Rate:  [81-99] 99 (03/06 0817) Resp:  [15-20] 15 (03/06 0817) BP: (94-136)/(63-88) 94/63 (03/06 0817) SpO2:  [94 %-98 %] 94 % (  03/06 0817) Weight:  [172 lb 6.4 oz (78.2 kg)] 172 lb 6.4 oz (78.2 kg) (03/06  0401) Physical Exam: General: awake and alert, laying in bed, eyes closed during entire exam  Cardiovascular: RRR, no MRG  Respiratory: CTAB, no wheezes, rales, or rhonchi  Abdomen: soft, non tender, non distended, bowel sounds normal  Extremities: no edema, non tender Neuro: would not participate in exam  Laboratory: Recent Labs  Lab 10/27/17 1146 10/28/17 0929 10/29/17 0611  WBC 14.2* 11.0* 9.8  HGB 13.6 12.9* 11.9*  HCT 40.4 38.4* 34.5*  PLT 178 170 180   Recent Labs  Lab 10/25/17 1503  10/27/17 1146 10/28/17 0929 10/29/17 0611  NA 138   < > 138 138 135  K 3.7   < > 3.2* 3.5 3.5  CL 101   < > 104 106 105  CO2 22   < > 23 24 21*  BUN 24*   < > 14 16 16   CREATININE 1.32*   < > 0.86 0.76 0.75  CALCIUM 12.0*   < > 10.2 10.3 10.0  PROT 7.7  --   --  5.7*  --   BILITOT 1.8*  --   --  1.8*  --   ALKPHOS 66  --   --  67  --   ALT 39  --   --  45  --   AST 140*  --   --  73*  --   GLUCOSE 186*   < > 116* 117* 89   < > = values in this interval not displayed.    Imaging/Diagnostic Tests: Ct Angio Head W Or Wo Contrast  Result Date: 10/27/2017 CLINICAL DATA:  Follow-up stroke. EXAM: CT ANGIOGRAPHY HEAD AND NECK TECHNIQUE: Multidetector CT imaging of the head and neck was performed using the standard protocol during bolus administration of intravenous contrast. Multiplanar CT image reconstructions and MIPs were obtained to evaluate the vascular anatomy. Carotid stenosis measurements (when applicable) are obtained utilizing NASCET criteria, using the distal internal carotid diameter as the denominator. CONTRAST:  34mL ISOVUE-370 IOPAMIDOL (ISOVUE-370) INJECTION 76% COMPARISON:  MRI of the head October 25, 2017 FINDINGS: CT HEAD FINDINGS BRAIN: No intraparenchymal hemorrhage, mass effect nor midline shift. The ventricles and sulci are normal for age. Confluent supratentorial white matter hypodensities. Old small LEFT cerebellar infarcts. No acute large vascular territory infarcts. Faint  density bilateral frontal extra-axial spaces with indistinctness. Basal cisterns are patent. VASCULAR: Moderate to severe calcific atherosclerosis of the carotid siphons. SKULL: No skull fracture. No significant scalp soft tissue swelling. SINUSES/ORBITS: Chronic severe LEFT maxillary sinusitis and chronic moderate LEFT sphenoid sinusitis. LEFT greater than RIGHT ethmoid mucosal thickening. Mastoid air cells are well aerated.The included ocular globes and orbital contents are non-suspicious. OTHER: None. CTA NECK FINDINGS: AORTIC ARCH: 4.3 cm ascending aorta. Moderate calcific atherosclerosis aortic arch and, intimal thickening. The origins of the innominate, left Common carotid artery and subclavian artery are widely patent. Moderate eccentric intimal thickening calcific atherosclerosis proximal LEFT subclavian artery. RIGHT CAROTID SYSTEM: Common carotid artery is widely patent, mildly tortuous course. Moderate eccentric calcific atherosclerosis carotid bifurcation without hemodynamically significant stenosis by NASCET criteria. Mild distal cervical ectasia without dissection or pseudoaneurysm. LEFT CAROTID SYSTEM: Common carotid artery is widely patent, mildly tortuous course. Moderate eccentric calcific atherosclerosis carotid bifurcation without hemodynamically significant stenosis by NASCET criteria. Mild distal cervical ectasia without dissection or pseudoaneurysm. VERTEBRAL ARTERIES:Left vertebral artery is dominant. Normal appearance of the vertebral arteries, widely patent. SKELETON: Nonacute. Calcified pannus about  the odontoid process most compatible with CPPD resulting in moderate craniocervical stenosis. Grade 1 C3-4 anterolisthesis. Severe RIGHT greater than LEFT C3-4, bilateral C5-6 and RIGHT C6-7 neural foraminal narrowing. OTHER NECK: Soft tissues of the neck are nonacute though, not tailored for evaluation. Surgical clips RIGHT thyroid, however there is a 19 mm complex RIGHT thyroid nodule. UPPER  CHEST: Included lung apices are clear. Small calcified mediastinal lymph nodes. No superior mediastinal lymphadenopathy. CTA HEAD FINDINGS: ANTERIOR CIRCULATION: Patent cervical internal carotid arteries, petrous, cavernous and supra clinoid internal carotid arteries. Calcific atherosclerosis results in mild stenosis bilateral supraclinoid internal carotid arteries. Patent anterior communicating artery. Patent anterior and middle cerebral arteries, mild luminal irregularity compatible with atherosclerosis. No large vessel occlusion, significant stenosis, contrast extravasation or aneurysm. POSTERIOR CIRCULATION: Patent vertebral arteries, vertebrobasilar junction and basilar artery, as well as main branch vessels. Patent posterior cerebral arteries. Focal severe stenosis RIGHT P3 segment. Tiny bilateral posterior communicating arteries present. No large vessel occlusion, contrast extravasation or aneurysm. VENOUS SINUSES: Major dural venous sinuses are patent though not tailored for evaluation on this angiographic examination. ANATOMIC VARIANTS: None. DELAYED PHASE: No abnormal intracranial enhancement. MIP images reviewed. IMPRESSION: CT HEAD: 1. Minimal bifrontal subarachnoid hemorrhage, possible tiny subdural hematomas. 2. Moderate to severe chronic small vessel ischemic disease. Old small LEFT cerebellar infarct. CTA NECK: 1. No hemodynamically significant stenosis or acute vascular process in the neck. 2. Status post probable RIGHT thyroidectomy with complex 19 mm RIGHT thyroid nodule. Recommend thyroid sonogram on nonemergent basis. This follows ACR consensus guidelines: Managing Incidental Thyroid Nodules Detected on Imaging: White Paper of the ACR Incidental Thyroid Findings Committee. J Am Coll Radiol 2015; 12:143-150. 3. **An incidental finding of potential clinical significance has been found. 4.3 cm aneurysmal ascending aorta. Recommend annual imaging followup by CTA or MRA. This recommendation follows  2010 ACCF/AHA/AATS/ACR/ASA/SCA/SCAI/SIR/STS/SVM Guidelines for the Diagnosis and Management of Patients with Thoracic Aortic Disease. Circulation. 2010; 121: H417-E081** 4. Multilevel severe neural foraminal narrowing and moderate craniocervical canal stenosis. CTA HEAD: 1. No emergent large vessel occlusion. Severe stenosis RIGHT P3 segment. 2. Mild stenosis bilateral supraclinoid internal carotid arteries. Electronically Signed   By: Elon Alas M.D.   On: 10/27/2017 15:45   Dg Shoulder Right  Result Date: 10/25/2017 CLINICAL DATA:  Pain after fall. EXAM: RIGHT SHOULDER - 2+ VIEW COMPARISON:  None. FINDINGS: There is no evidence of fracture or dislocation. There is no evidence of arthropathy or other focal bone abnormality. Soft tissues are unremarkable. IMPRESSION: Negative. Electronically Signed   By: Dorise Bullion III M.D   On: 10/25/2017 16:42   Ct Head Wo Contrast  Result Date: 10/29/2017 CLINICAL DATA:  Follow-up exam for subarachnoid hemorrhage. EXAM: CT HEAD WITHOUT CONTRAST TECHNIQUE: Contiguous axial images were obtained from the base of the skull through the vertex without intravenous contrast. COMPARISON:  Prior CT from 10/27/2017. FINDINGS: Brain: Previously noted trace bifrontal subarachnoid hemorrhage and possible small subdural hemorrhage no longer clearly delineated, likely resolved in the interim. No new intracranial hemorrhage identified. No acute large vessel territory infarct. No mass lesion, midline shift or mass effect. No hydrocephalus. No appreciable extra-axial fluid collection. Atrophy with chronic small vessel ischemic disease again noted. Vascular: No hyperdense vessel. Calcified atherosclerosis noted at the skull base. Skull: Previously seen scalp contusion has essentially resolved. Calvarium intact and unchanged. Sinuses/Orbits: Globes and orbital soft tissues within normal limits. Left-sided paranasal sinus disease again noted, stable. No mastoid effusion. Other: None.  IMPRESSION: 1. Interval resolution of previously seen minimal bifrontal  subarachnoid and subdural hemorrhage. No intracranial hemorrhage now identified. 2. No other new acute intracranial process. 3. Stable atrophy with advanced chronic microvascular ischemic disease. Electronically Signed   By: Jeannine Boga M.D.   On: 10/29/2017 04:14   Ct Head Wo Contrast  Addendum Date: 10/25/2017   ADDENDUM REPORT: 10/25/2017 18:09 ADDENDUM: Omitted from the original discussion is the presence of multifocal mucosal thickening throughout the paranasal sinuses, most evident in the ethmoid sinuses bilaterally, sphenoid sinus and in the maxillary sinuses (left greater than right). Inspissated secretions are noted in the left maxillary sinus. These findings suggest chronic sinusitis. Electronically Signed   By: Vinnie Langton M.D.   On: 10/25/2017 18:09   Result Date: 10/25/2017 CLINICAL DATA:  84 year old male with history of trauma from a fall to the floor yesterday evening at 10 p.m. Right-sided weakness. EXAM: CT HEAD WITHOUT CONTRAST TECHNIQUE: Contiguous axial images were obtained from the base of the skull through the vertex without intravenous contrast. COMPARISON:  None. FINDINGS: Brain: Moderate cerebral and cerebellar atrophy. Patchy and confluent areas of decreased attenuation are noted throughout the deep and periventricular white matter of the cerebral hemispheres bilaterally, compatible with chronic microvascular ischemic disease. No evidence of acute infarction, hemorrhage, hydrocephalus, extra-axial collection or mass lesion/mass effect. Vascular: No hyperdense vessel or unexpected calcification. Skull: Normal. Negative for fracture or focal lesion. Sinuses/Orbits: No acute finding. Other: Soft tissue swelling in the scalp near the vertex. IMPRESSION: 1. Soft tissue swelling in the scalp near the vertex, presumably a hematoma. No acute intracranial abnormalities. 2. Moderate cerebral and cerebellar  atrophy with extensive chronic microvascular ischemic changes in the cerebral white matter. Electronically Signed: By: Vinnie Langton M.D. On: 10/25/2017 17:01   Ct Angio Neck W Or Wo Contrast  Result Date: 10/27/2017 CLINICAL DATA:  Follow-up stroke. EXAM: CT ANGIOGRAPHY HEAD AND NECK TECHNIQUE: Multidetector CT imaging of the head and neck was performed using the standard protocol during bolus administration of intravenous contrast. Multiplanar CT image reconstructions and MIPs were obtained to evaluate the vascular anatomy. Carotid stenosis measurements (when applicable) are obtained utilizing NASCET criteria, using the distal internal carotid diameter as the denominator. CONTRAST:  48mL ISOVUE-370 IOPAMIDOL (ISOVUE-370) INJECTION 76% COMPARISON:  MRI of the head October 25, 2017 FINDINGS: CT HEAD FINDINGS BRAIN: No intraparenchymal hemorrhage, mass effect nor midline shift. The ventricles and sulci are normal for age. Confluent supratentorial white matter hypodensities. Old small LEFT cerebellar infarcts. No acute large vascular territory infarcts. Faint density bilateral frontal extra-axial spaces with indistinctness. Basal cisterns are patent. VASCULAR: Moderate to severe calcific atherosclerosis of the carotid siphons. SKULL: No skull fracture. No significant scalp soft tissue swelling. SINUSES/ORBITS: Chronic severe LEFT maxillary sinusitis and chronic moderate LEFT sphenoid sinusitis. LEFT greater than RIGHT ethmoid mucosal thickening. Mastoid air cells are well aerated.The included ocular globes and orbital contents are non-suspicious. OTHER: None. CTA NECK FINDINGS: AORTIC ARCH: 4.3 cm ascending aorta. Moderate calcific atherosclerosis aortic arch and, intimal thickening. The origins of the innominate, left Common carotid artery and subclavian artery are widely patent. Moderate eccentric intimal thickening calcific atherosclerosis proximal LEFT subclavian artery. RIGHT CAROTID SYSTEM: Common carotid  artery is widely patent, mildly tortuous course. Moderate eccentric calcific atherosclerosis carotid bifurcation without hemodynamically significant stenosis by NASCET criteria. Mild distal cervical ectasia without dissection or pseudoaneurysm. LEFT CAROTID SYSTEM: Common carotid artery is widely patent, mildly tortuous course. Moderate eccentric calcific atherosclerosis carotid bifurcation without hemodynamically significant stenosis by NASCET criteria. Mild distal cervical ectasia without dissection or pseudoaneurysm.  VERTEBRAL ARTERIES:Left vertebral artery is dominant. Normal appearance of the vertebral arteries, widely patent. SKELETON: Nonacute. 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 Grade 1 C3-4 anterolisthesis. Severe RIGHT greater than LEFT C3-4, bilateral C5-6 and RIGHT C6-7 neural foraminal narrowing. OTHER NECK: Soft tissues of the neck are nonacute though, not tailored for evaluation. Surgical clips RIGHT thyroid, however there is a 19 mm complex RIGHT thyroid nodule. UPPER CHEST: Included lung apices are clear. Small calcified mediastinal lymph nodes. No superior mediastinal lymphadenopathy. CTA HEAD FINDINGS: ANTERIOR CIRCULATION: Patent cervical internal carotid arteries, petrous, cavernous and supra clinoid internal carotid arteries. Calcific atherosclerosis results in mild stenosis bilateral supraclinoid internal carotid arteries. Patent anterior communicating artery. Patent anterior and middle cerebral arteries, mild luminal irregularity compatible with atherosclerosis. No large vessel occlusion, significant stenosis, contrast extravasation or aneurysm. POSTERIOR CIRCULATION: Patent vertebral arteries, vertebrobasilar junction and basilar artery, as well as main branch vessels. Patent posterior cerebral arteries. Focal severe stenosis RIGHT P3 segment. Tiny bilateral posterior communicating arteries present. No large  vessel occlusion, contrast extravasation or aneurysm. VENOUS SINUSES: Major dural venous sinuses are patent though not tailored for evaluation on this angiographic examination. ANATOMIC VARIANTS: None. DELAYED PHASE: No abnormal intracranial enhancement. MIP images reviewed. IMPRESSION: CT HEAD: 1. Minimal bifrontal subarachnoid hemorrhage, possible tiny subdural hematomas. 2. Moderate to severe chronic small vessel ischemic disease. Old small LEFT cerebellar infarct. CTA NECK: 1. No hemodynamically significant stenosis or acute vascular process in the neck. 2. Status post probable RIGHT thyroidectomy with complex 19 mm RIGHT thyroid nodule. Recommend thyroid sonogram on nonemergent basis. This follows ACR consensus guidelines: Managing Incidental Thyroid Nodules Detected on Imaging: White Paper of the ACR Incidental Thyroid Findings Committee. J Am Coll Radiol 2015; 12:143-150. 3. **An incidental finding of potential clinical significance has been found. 4.3 cm aneurysmal ascending aorta. Recommend annual imaging followup by CTA or MRA. This recommendation follows 2010 ACCF/AHA/AATS/ACR/ASA/SCA/SCAI/SIR/STS/SVM Guidelines for the Diagnosis and Management of Patients with Thoracic Aortic Disease. Circulation. 2010; 121: V672-C947** 4. Multilevel severe neural foraminal narrowing and moderate craniocervical canal stenosis. CTA HEAD: 1. No emergent large vessel occlusion. Severe stenosis RIGHT P3 segment. 2. Mild stenosis bilateral supraclinoid internal carotid arteries. Electronically Signed   By: Elon Alas M.D.   On: 10/27/2017 15:45   Mr Jeri Cos SJ Contrast  Result Date: 10/25/2017 CLINICAL DATA:  84 y/o M; found on bathroom floor last night at 2200 hours. Right-sided weakness and right-sided neglect. EXAM: MRI HEAD WITHOUT AND WITH CONTRAST TECHNIQUE: Multiplanar, multiecho pulse sequences of the brain and surrounding structures were obtained without and with intravenous contrast. CONTRAST:  109mL  MULTIHANCE GADOBENATE DIMEGLUMINE 529 MG/ML IV SOLN COMPARISON:  10/25/2017 CT head FINDINGS: Brain: 12 mm focus of reduced diffusion within the right occipital lobe (series 2, image 25 and series 5, image 2) compatible with acute/early subacute infarction. No hemorrhage or mass effect. Patchy confluentnonspecific foci of T2 FLAIR hyperintense signal abnormality in subcortical and periventricular white matter are compatible withmoderatechronic microvascular ischemic changes for age. Small chronic lacunar infarct within the left thalamus. Moderatebrain parenchymal volume loss. Motion degraded SWI sequence, no gross hemorrhage on GRE sequence. After administration of intravenous contrast there is thin diffuse smooth pachymeningeal enhancement. No brain parenchymal enhancement. Vascular: Normal flow voids. Skull and upper cervical spine: Soft tissue pannus surrounding odontoid process with mild cervical spinal canal stenosis may be degenerative, due to rheumatoid disease, or crystal deposition disease. Mild scalp soft tissue thickening over the vertex  may represent a contusion. No calvarial bone marrow edema. Sinuses/Orbits: Sphenoid, left ethmoid, and left mastoid opacification, fluid levels, and diffuse paranasal sinus mucosal thickening. No abnormal signal of mastoid air cells. Orbits are unremarkable. Other: None. IMPRESSION: 1. 12 mm focus of reduced diffusion in right occipital lobe, probably acute/early subacute infarct. No hemorrhage or mass effect. 2. Thin smooth diffuse pachymeningeal thickening without nodularity. This can be seen in the setting of head trauma or meningitis. No additional findings for differential considerations of intracranial hypotension, mass infiltration, granulomatous disease, or hypertrophic pachymeningitis. 3. Moderate chronic microvascular ischemic changes and moderate parenchymal volume loss of the brain. 4. Extensive paranasal sinus disease with fluid levels which may represent  acute sinusitis. These results will be called to the ordering clinician or representative by the Radiologist Assistant, and communication documented in the PACS or zVision Dashboard. Electronically Signed   By: Kristine Garbe M.D.   On: 10/25/2017 22:43   Dg Chest Port 1 View  Result Date: 10/25/2017 CLINICAL DATA:  Right-sided weakness, right-sided neglect EXAM: PORTABLE CHEST 1 VIEW COMPARISON:  01/25/2010 FINDINGS: There is no focal parenchymal opacity. There is no pleural effusion or pneumothorax. The heart and mediastinal contours are unremarkable. There is mild osteoarthritis of bilateral glenohumeral joints. IMPRESSION: No active disease. Electronically Signed   By: Kathreen Devoid   On: 10/25/2017 15:55   Dg Hips Bilat With Pelvis 3-4 Views  Result Date: 10/25/2017 CLINICAL DATA:  Pain after fall EXAM: DG HIP (WITH OR WITHOUT PELVIS) 3-4V BILAT COMPARISON:  None. FINDINGS: There is no evidence of hip fracture or dislocation. There is no evidence of arthropathy or other focal bone abnormality. IMPRESSION: Negative. Electronically Signed   By: Dorise Bullion III M.D   On: 10/25/2017 16:46     Caroline More, DO 10/29/2017, 11:05 AM PGY-1, [place] Intern pager: [phone], text pages welcome

## 2017-10-29 NOTE — H&P (Signed)
Physical Medicine and Rehabilitation Admission H&P    Chief Complaint  Patient presents with  . Cerebrovascular Accident  . Altered Mental Status  : HPI:  Ian [name] is a 84 y.o. right handed male with history of hypertension and dementia. Per chart review and wife, patient lives with spouse, who works. Reported be independent prior to admission with some reported memory loss in the recent months. 2 level home 3 steps to entry. Presented 10/25/2017 with altered mental status as well as reported fall possible syncopal event. Patient was down for approximately 16 hours before being found. CT/MRI the brain reviewed, showing right occipital infarct. Per report, 12 mm focus of reduced diffusion in the right occipital lobe, acute early subacute infarct. No hemorrhage or mass effect. X-ray of hip and shoulder negative. Mildly elevated troponin 0.19 felt to be related to demand ischemia. CT angiogram head and neck showed no large vessel occlusion or significant stenosis. Carotid Dopplers with no evidence of stenosis bilaterally. . BUN 24, creatinine 1.32, CK elevated 5905, UDS negative, lactic acid 5.64. Patient placed on IV fluids. Echocardiogram with ejection fraction of 95% grade 1 diastolic dysfunction. EEG negative for seizure. Neurology follow-up placed on aspirin and Plavix 3 weeks then Plavix alone. Plan 30 day monitor on discharge. CK improved 825-377.   Full liquid diet and advanced as tolerated. Physical and occupational therapy evaluations completed 10/27/2017 with recommendations of physical medicine rehabilitation consult. Patient was admitted for a comprehensive rehabilitation program    Review of Systems  Constitutional: Negative for chills and fever.  HENT: Negative for hearing loss.   Eyes: Negative for blurred vision and double vision.  Respiratory: Negative for cough and shortness of breath.   Cardiovascular: Negative for chest pain and palpitations.  Gastrointestinal:  Positive for constipation. Negative for nausea and vomiting.  Genitourinary: Positive for urgency.  Musculoskeletal: Positive for falls.  Skin: Negative for rash.  Neurological: Positive for focal weakness.  Psychiatric/Behavioral: Positive for memory loss.  All other systems reviewed and are negative.  Past Medical History:  Diagnosis Date  . 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 Calculus of kidney 03/31/2013   Overview:  IMPRESSION: see ER note   . Leukocytosis 10/26/2017  . Loss of weight 10/26/2017  . Multiple leg contusions, unspecified laterality, sequela 10/26/2017  . Scalp hematoma, subsequent encounter 10/26/2017   No past surgical history. No pertinent family history of premature CVA. Social History:  reports that  has never smoked. he has never used smokeless tobacco. He reports that he does not use drugs. His alcohol history is not on file. Allergies: No Known Allergies Medications Prior to Admission  Medication Sig Dispense Refill  . amLODipine (NORVASC) 10 MG tablet Take 10 mg by mouth daily.     Marland Kitchen aspirin EC 81 MG tablet Take 81 mg by mouth daily.     . hydrochlorothiazide (HYDRODIURIL) 25 MG tablet Take 25 mg by mouth daily.     . polyethylene glycol powder (GLYCOLAX/MIRALAX) powder Take 17 g by mouth 2 (two) times daily as needed. (Patient taking differently: Take 17 g by mouth 2 (two) times daily as needed for mild constipation. ) 3350 g 0    Drug Regimen Review Drug regimen was reviewed and remains appropriate with no significant issues identified  Home: Home Living Family/patient expects to be discharged to:: Private residence Living Arrangements: Spouse/significant other Available Help at Discharge: Family Type of Home: House Home Access: Stairs to enter [employer] of Steps: 3 Home  Layout: Two level, Able to live on main level with bedroom/bathroom Bathroom Shower/Tub: Multimedia programmer: Standard   Functional History: Prior  Function Level of Independence: Independent Comments: wife notes some forgetfulness in recent months  Functional Status:  Mobility: Bed Mobility Overal bed mobility: Needs Assistance Bed Mobility: Supine to Sit, Sit to Supine Supine to sit: Mod assist Sit to supine: Min assist General bed mobility comments: Mod A to elevate trunk into sitting. Min A for bringing RLE over EOB Transfers Overall transfer level: Needs assistance Equipment used: Rolling walker (2 wheeled) Transfers: Sit to/from Stand, Lateral/Scoot Transfers Sit to Stand: From elevated surface, Min assist, +2 physical assistance  Lateral/Scoot Transfers: Min guard General transfer comment: Min A for power up into standing. Max A for standing balance once upright. Ambulation/Gait General Gait Details: pt unable/unwilling to attempt at this time    ADL: ADL Overall ADL's : Needs assistance/impaired Eating/Feeding: Set up, Supervision/ safety, Sitting Grooming: Set up, Supervision/safety, Sitting Upper Body Bathing: Moderate assistance, Sitting Lower Body Bathing: Maximal assistance, Sit to/from stand Upper Body Dressing : Minimal assistance, Sitting Upper Body Dressing Details (indicate cue type and reason): Min A for donning new gown.  Lower Body Dressing: Maximal assistance, +2 for safety/equipment, Sit to/from stand Lower Body Dressing Details (indicate cue type and reason): Max A for standing balance. Pt able to bend forward to pull up sock with LUE. RUE with limited reach due to pain Functional mobility during ADLs: Maximal assistance, +2 for physical assistance, Rolling walker(side steps to [facility]) General ADL Comments: Pt demosntrating poor fucntional performance. Max A for LB ADLs and present with poor balance, vision, and cognition  Cognition: Cognition Overall Cognitive Status: Impaired/Different from baseline Orientation Level: Oriented to time, Disoriented to place, Oriented to person, Disoriented to  situation Cognition Arousal/Alertness: Awake/alert Behavior During Therapy: WFL for tasks assessed/performed, Flat affect Overall Cognitive Status: Impaired/Different from baseline Area of Impairment: Attention, Following commands, Safety/judgement, Awareness, Problem solving Orientation Level: Time Current Attention Level: Sustained Following Commands: Follows one step commands inconsistently, Follows one step commands with increased time Safety/Judgement: Decreased awareness of safety, Decreased awareness of deficits Awareness: Emergent Problem Solving: Slow processing, Decreased initiation, Difficulty sequencing, Requires verbal cues, Requires tactile cues General Comments: Pt demonstarting decreased problem solving, attention, and awareness. Required increased cues and time throughout session.   Physical Exam: Blood pressure (!) 152/93, pulse 86, temperature 98 F (36.7 C), temperature source Oral, resp. rate 18, height '5\' 11"'$  (1.803 m), weight 77.2 kg (170 lb 3.1 oz), SpO2 97 %. Physical Exam Vitals reviewed. Constitutional: He appears well-developed and well-nourished Sitter at bedside  HENT:  Head: Normocephalic.  Right Ear: External ear normal.  Left Ear: External ear normal.  Eyes: EOM are normal. Right eye exhibits no discharge. Left eye exhibits no discharge.  Pupils reactive to light  Neck: Normal range of motion. Neck supple. No thyromegaly present.  Cardiovascular: Normal rate, regular rhythm and normal heart sounds.  Respiratory:  Fair inspiratory effort with some upper airway congestion  GI: Soft. Bowel sounds are normal. He exhibits no distension/nontender Skin.Warm and dry with scattered hematomas Musculoskeletal:  No edema or tenderness in extremities  Neurological: He is alert.  HOH Followed simple commands.  A&Ox3 with cues Very limited historian Motor: LUE: 5/5 proximal to distal LLE: HF, KE 4-/5, ADF 4-/5 RUE: 3+/5 proximal to distal RLE: HF, KE 2/5,  ADF 0/5 Sensation intact to light touch     Results for orders placed or performed during the  hospital encounter of 10/25/17 (from the past 48 hour(s))  Troponin I     Status: Abnormal   Collection Time: 10/26/17  8:01 AM  Result Value Ref Range   Troponin I 0.17 (HH) <0.03 ng/mL    Comment: CRITICAL VALUE NOTED.  VALUE IS CONSISTENT WITH PREVIOUSLY REPORTED AND CALLED VALUE. Performed at [facility], [place] [address] [place], [place] [address]   CBC     Status: Abnormal   Collection Time: 10/26/17  8:01 AM  Result Value Ref Range   WBC 18.0 (H) 4.0 - 10.5 K/uL   RBC 4.24 4.22 - 5.81 MIL/uL   Hemoglobin 14.5 13.0 - 17.0 g/dL   HCT 41.4 39.0 - 52.0 %   MCV 97.6 78.0 - 100.0 fL   MCH 34.2 (H) 26.0 - 34.0 pg   MCHC 35.0 30.0 - 36.0 g/dL   RDW 13.0 11.5 - 15.5 %   Platelets 169 150 - 400 K/uL    Comment: Performed at [facility], [place] [address] [place], [place] [address]  Basic metabolic panel     Status: Abnormal   Collection Time: 10/26/17  8:01 AM  Result Value Ref Range   Sodium 138 135 - 145 mmol/L   Potassium 2.9 (L) 3.5 - 5.1 mmol/L   Chloride 105 101 - 111 mmol/L   CO2 23 22 - 32 mmol/L   Glucose, Bld 101 (H) 65 - 99 mg/dL   BUN 22 (H) 6 - 20 mg/dL   Creatinine, Ser 1.01 0.61 - 1.24 mg/dL   Calcium 10.9 (H) 8.9 - 10.3 mg/dL   GFR calc non Af Amer >60 >60 mL/min   GFR calc Af Amer >60 >60 mL/min    Comment: (NOTE) The eGFR has been calculated using the CKD EPI equation. This calculation has not been validated in all clinical situations. eGFR's persistently <60 mL/min signify possible Chronic Kidney Disease.    Anion gap 10 5 - 15    Comment: Performed at [facility] [address] [place], [place] [address]  CK     Status: Abnormal   Collection Time: 10/26/17  8:01 AM  Result Value Ref Range   Total CK 2,067 (H) 49 - 397 U/L    Comment: Performed at [facility], [place] [address] [place], [place] [address]  T4, free     Status: None     Collection Time: 10/26/17  4:00 PM  Result Value Ref Range   Free T4 0.77 0.61 - 1.12 ng/dL    Comment: (NOTE) Biotin ingestion may interfere with free T4 tests. If the results are inconsistent with the TSH level, previous test results, or the clinical presentation, then consider biotin interference. If needed, order repeat testing after stopping biotin. Performed at [facility], [place] [address], [place], [place] [address]   Magnesium     Status: Abnormal   Collection Time: 10/27/17 11:46 AM  Result Value Ref Range   Magnesium 1.2 (L) 1.7 - 2.4 mg/dL    Comment: Performed at [facility] [address] [place],  [address]  Basic metabolic panel     Status: Abnormal   Collection Time: 10/27/17 11:46 AM  Result Value Ref Range   Sodium 138 135 - 145 mmol/L   Potassium 3.2 (L) 3.5 - 5.1 mmol/L   Chloride 104 101 - 111 mmol/L   CO2 23 22 - 32 mmol/L   Glucose, Bld 116 (H) 65 - 99  mg/dL   BUN 14 6 - 20 mg/dL   Creatinine, Ser 0.86 0.61 - 1.24 mg/dL   Calcium 10.2 8.9 - 10.3 mg/dL   GFR calc non Af Amer >60 >60 mL/min   GFR calc Af Amer >60 >60 mL/min    Comment: (NOTE) The eGFR has been calculated using the CKD EPI equation. This calculation has not been validated in all clinical situations. eGFR's persistently <60 mL/min signify possible Chronic Kidney Disease.    Anion gap 11 5 - 15    Comment: Performed at [facility] [address] [place], [place] [address]  CK     Status: Abnormal   Collection Time: 10/27/17 11:46 AM  Result Value Ref Range   Total CK 825 (H) 49 - 397 U/L    Comment: Performed at [facility], [place] [address] [place], [place] [address]  CBC     Status: Abnormal   Collection Time: 10/27/17 11:46 AM  Result Value Ref Range   WBC 14.2 (H) 4.0 - 10.5 K/uL   RBC 4.05 (L) 4.22 - 5.81 MIL/uL   Hemoglobin 13.6 13.0 - 17.0 g/dL   HCT 40.4 39.0 - 52.0 %   MCV 99.8 78.0 - 100.0 fL   MCH 33.6 26.0 - 34.0 pg   MCHC 33.7  30.0 - 36.0 g/dL   RDW 13.3 11.5 - 15.5 %   Platelets 178 150 - 400 K/uL    Comment: Performed at [facility], [place] [address], [place], [place] [address]   Ct Angio Head W Or Wo Contrast  Result Date: 10/27/2017 CLINICAL DATA:  Follow-up stroke. EXAM: CT ANGIOGRAPHY HEAD AND NECK TECHNIQUE: Multidetector CT imaging of the head and neck was performed using the standard protocol during bolus administration of intravenous contrast. Multiplanar CT image reconstructions and MIPs were obtained to evaluate the vascular anatomy. Carotid stenosis measurements (when applicable) are obtained utilizing NASCET criteria, using the distal internal carotid diameter as the denominator. CONTRAST:  1m ISOVUE-370 IOPAMIDOL (ISOVUE-370) INJECTION 76% COMPARISON:  MRI of the head October 25, 2017 FINDINGS: CT HEAD FINDINGS BRAIN: No intraparenchymal hemorrhage, mass effect nor midline shift. The ventricles and sulci are normal for age. Confluent supratentorial white matter hypodensities. Old small LEFT cerebellar infarcts. No acute large vascular territory infarcts. Faint density bilateral frontal extra-axial spaces with indistinctness. Basal cisterns are patent. VASCULAR: Moderate to severe calcific atherosclerosis of the carotid siphons. SKULL: No skull fracture. No significant scalp soft tissue swelling. SINUSES/ORBITS: Chronic severe LEFT maxillary sinusitis and chronic moderate LEFT sphenoid sinusitis. LEFT greater than RIGHT ethmoid mucosal thickening. Mastoid air cells are well aerated.The included ocular globes and orbital contents are non-suspicious. OTHER: None. CTA NECK FINDINGS: AORTIC ARCH: 4.3 cm ascending aorta. Moderate calcific atherosclerosis aortic arch and, intimal thickening. The origins of the innominate, left Common carotid artery and subclavian artery are widely patent. Moderate eccentric intimal thickening calcific atherosclerosis proximal LEFT subclavian artery. RIGHT CAROTID SYSTEM: Common carotid  artery is widely patent, mildly tortuous course. Moderate eccentric calcific atherosclerosis carotid bifurcation without hemodynamically significant stenosis by NASCET criteria. Mild distal cervical ectasia without dissection or pseudoaneurysm. LEFT CAROTID SYSTEM: Common carotid artery is widely patent, mildly tortuous course. Moderate eccentric calcific atherosclerosis carotid bifurcation without hemodynamically significant stenosis by NASCET criteria. Mild distal cervical ectasia without dissection or pseudoaneurysm. VERTEBRAL ARTERIES:Left vertebral artery is dominant. Normal appearance of the vertebral arteries, widely patent. SKELETON: Nonacute. 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 Grade 1 C3-4 anterolisthesis. Severe RIGHT greater than LEFT C3-4, bilateral C5-6 and RIGHT C6-7 neural foraminal narrowing. OTHER NECK: Soft tissues of the neck are nonacute though, not tailored for evaluation. Surgical clips RIGHT thyroid, however there is a 19 mm complex RIGHT thyroid nodule. UPPER CHEST: Included lung apices are clear. Small calcified mediastinal lymph nodes. No superior mediastinal lymphadenopathy. CTA HEAD FINDINGS: ANTERIOR CIRCULATION: Patent cervical internal carotid arteries, petrous, cavernous and supra clinoid internal carotid arteries. Calcific atherosclerosis results in mild stenosis bilateral supraclinoid internal carotid arteries. Patent anterior communicating artery. Patent anterior and middle cerebral arteries, mild luminal irregularity compatible with atherosclerosis. No large vessel occlusion, significant stenosis, contrast extravasation or aneurysm. POSTERIOR CIRCULATION: Patent vertebral arteries, vertebrobasilar junction and basilar artery, as well as main branch vessels. Patent posterior cerebral arteries. Focal severe stenosis RIGHT P3 segment. Tiny bilateral posterior communicating arteries present. No large  vessel occlusion, contrast extravasation or aneurysm. VENOUS SINUSES: Major dural venous sinuses are patent though not tailored for evaluation on this angiographic examination. ANATOMIC VARIANTS: None. DELAYED PHASE: No abnormal intracranial enhancement. MIP images reviewed. IMPRESSION: CT HEAD: 1. Minimal bifrontal subarachnoid hemorrhage, possible tiny subdural hematomas. 2. Moderate to severe chronic small vessel ischemic disease. Old small LEFT cerebellar infarct. CTA NECK: 1. No hemodynamically significant stenosis or acute vascular process in the neck. 2. Status post probable RIGHT thyroidectomy with complex 19 mm RIGHT thyroid nodule. Recommend thyroid sonogram on nonemergent basis. This follows ACR consensus guidelines: Managing Incidental Thyroid Nodules Detected on Imaging: White Paper of the ACR Incidental Thyroid Findings Committee. J Am Coll Radiol 2015; 12:143-150. 3. **An incidental finding of potential clinical significance has been found. 4.3 cm aneurysmal ascending aorta. Recommend annual imaging followup by CTA or MRA. This recommendation follows 2010 ACCF/AHA/AATS/ACR/ASA/SCA/SCAI/SIR/STS/SVM Guidelines for the Diagnosis and Management of Patients with Thoracic Aortic Disease. Circulation. 2010; 121: O709-G283** 4. Multilevel severe neural foraminal narrowing and moderate craniocervical canal stenosis. CTA HEAD: 1. No emergent large vessel occlusion. Severe stenosis RIGHT P3 segment. 2. Mild stenosis bilateral supraclinoid internal carotid arteries. Electronically Signed   By: Elon Alas M.D.   On: 10/27/2017 15:45   Ct Angio Neck W Or Wo Contrast  Result Date: 10/27/2017 CLINICAL DATA:  Follow-up stroke. EXAM: CT ANGIOGRAPHY HEAD AND NECK TECHNIQUE: Multidetector CT imaging of the head and neck was performed using the standard protocol during bolus administration of intravenous contrast. Multiplanar CT image reconstructions and MIPs were obtained to evaluate the vascular anatomy.  Carotid stenosis measurements (when applicable) are obtained utilizing NASCET criteria, using the distal internal carotid diameter as the denominator. CONTRAST:  24m ISOVUE-370 IOPAMIDOL (ISOVUE-370) INJECTION 76% COMPARISON:  MRI of the head October 25, 2017 FINDINGS: CT HEAD FINDINGS BRAIN: No intraparenchymal hemorrhage, mass effect nor midline shift. The ventricles and sulci are normal for age. Confluent supratentorial white matter hypodensities. Old small LEFT cerebellar infarcts. No acute large vascular territory infarcts. Faint density bilateral frontal extra-axial spaces with indistinctness. Basal cisterns are patent. VASCULAR: Moderate to severe calcific atherosclerosis of the carotid siphons. SKULL: No skull fracture. No significant scalp soft tissue swelling. SINUSES/ORBITS: Chronic severe LEFT maxillary sinusitis and chronic moderate LEFT sphenoid sinusitis. LEFT greater than RIGHT ethmoid mucosal thickening. Mastoid air cells are well aerated.The included ocular globes and orbital contents are non-suspicious. OTHER: None. CTA NECK FINDINGS: AORTIC ARCH: 4.3 cm ascending aorta. Moderate calcific atherosclerosis aortic arch and, intimal thickening. The origins of the innominate, left Common carotid artery and subclavian artery are widely patent. Moderate eccentric intimal  thickening calcific atherosclerosis proximal LEFT subclavian artery. RIGHT CAROTID SYSTEM: Common carotid artery is widely patent, mildly tortuous course. Moderate eccentric calcific atherosclerosis carotid bifurcation without hemodynamically significant stenosis by NASCET criteria. Mild distal cervical ectasia without dissection or pseudoaneurysm. LEFT CAROTID SYSTEM: Common carotid artery is widely patent, mildly tortuous course. Moderate eccentric calcific atherosclerosis carotid bifurcation without hemodynamically significant stenosis by NASCET criteria. Mild distal cervical ectasia without dissection or pseudoaneurysm. VERTEBRAL  ARTERIES:Left vertebral artery is dominant. Normal appearance of the vertebral arteries, widely patent. SKELETON: Nonacute. 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 Grade 1 C3-4 anterolisthesis. Severe RIGHT greater than LEFT C3-4, bilateral C5-6 and RIGHT C6-7 neural foraminal narrowing. OTHER NECK: Soft tissues of the neck are nonacute though, not tailored for evaluation. Surgical clips RIGHT thyroid, however there is a 19 mm complex RIGHT thyroid nodule. UPPER CHEST: Included lung apices are clear. Small calcified mediastinal lymph nodes. No superior mediastinal lymphadenopathy. CTA HEAD FINDINGS: ANTERIOR CIRCULATION: Patent cervical internal carotid arteries, petrous, cavernous and supra clinoid internal carotid arteries. Calcific atherosclerosis results in mild stenosis bilateral supraclinoid internal carotid arteries. Patent anterior communicating artery. Patent anterior and middle cerebral arteries, mild luminal irregularity compatible with atherosclerosis. No large vessel occlusion, significant stenosis, contrast extravasation or aneurysm. POSTERIOR CIRCULATION: Patent vertebral arteries, vertebrobasilar junction and basilar artery, as well as main branch vessels. Patent posterior cerebral arteries. Focal severe stenosis RIGHT P3 segment. Tiny bilateral posterior communicating arteries present. No large vessel occlusion, contrast extravasation or aneurysm. VENOUS SINUSES: Major dural venous sinuses are patent though not tailored for evaluation on this angiographic examination. ANATOMIC VARIANTS: None. DELAYED PHASE: No abnormal intracranial enhancement. MIP images reviewed. IMPRESSION: CT HEAD: 1. Minimal bifrontal subarachnoid hemorrhage, possible tiny subdural hematomas. 2. Moderate to severe chronic small vessel ischemic disease. Old small LEFT cerebellar infarct. CTA NECK: 1. No hemodynamically significant stenosis or acute  vascular process in the neck. 2. Status post probable RIGHT thyroidectomy with complex 19 mm RIGHT thyroid nodule. Recommend thyroid sonogram on nonemergent basis. This follows ACR consensus guidelines: Managing Incidental Thyroid Nodules Detected on Imaging: White Paper of the ACR Incidental Thyroid Findings Committee. J Am Coll Radiol 2015; 12:143-150. 3. **An incidental finding of potential clinical significance has been found. 4.3 cm aneurysmal ascending aorta. Recommend annual imaging followup by CTA or MRA. This recommendation follows 2010 ACCF/AHA/AATS/ACR/ASA/SCA/SCAI/SIR/STS/SVM Guidelines for the Diagnosis and Management of Patients with Thoracic Aortic Disease. Circulation. 2010; 121: I097-D532** 4. Multilevel severe neural foraminal narrowing and moderate craniocervical canal stenosis. CTA HEAD: 1. No emergent large vessel occlusion. Severe stenosis RIGHT P3 segment. 2. Mild stenosis bilateral supraclinoid internal carotid arteries. Electronically Signed   By: Elon Alas M.D.   On: 10/27/2017 15:45       Medical Problem List and Plan: 1.  Decreased functional mobility, possible syncopal event with fall secondary to right occipital embolic infarction as well as rhabdomyolysis. Aspirin and Plavix 3 weeks then Plavix alone. Plan for 30 day monitor on discharge 2.  DVT Prophylaxis/Anticoagulation: SCDs. Monitor for any signs of DVT 3. Pain Management: Tylenol as needed 4. Mood/dementia. Discuss baseline with wife.  5. Neuropsych: This patient is capable of making decisions on his own behalf. 6. Skin/Wound Care: Routine skin checks 7. Fluids/Electrolytes/Nutrition: Routine I&O's with follow-up chemistries 8. Hypertension. Norvasc 5 mg daily. Monitor with increased mobility 9. Hypothyroidism. Synthroid. TSH 9.181. Free T4 0.77. Continue current supplement for now 10. Dysphagia. Follow-up speech therapy. Advance diet as tolerated. 13. Diastolic dysfunction:  Monitor for signs/symptoms of  fluid overload    Post Admission Physician Evaluation: 1. Preadmission assessment reviewed and changes made below. 2. Functional deficits secondary  to right occipital embolic infarction. 3. Patient is admitted to receive collaborative, interdisciplinary care between the physiatrist, rehab nursing staff, and therapy team. 4. Patient's level of medical complexity and substantial therapy needs in context of that medical necessity cannot be provided at a lesser intensity of care such as a SNF. 5. Patient has experienced substantial functional loss from his/her baseline which was documented above under the "Functional History" and "Functional Status" headings.  Judging by the patient's diagnosis, physical exam, and functional history, the patient has potential for functional progress which will result in measurable gains while on inpatient rehab.  These gains will be of substantial and practical use upon discharge  in facilitating mobility and self-care at the household level. 15. Physiatrist will provide 24 hour management of medical needs as well as oversight of the therapy plan/treatment and provide guidance as appropriate regarding the interaction of the two. 7. 24 hour rehab nursing will assist with bladder management, safety, disease management, medication administration and patient education  and help integrate therapy concepts, techniques,education, etc. 8. PT will assess and treat for/with: Lower extremity strength, range of motion, stamina, balance, functional mobility, safety, adaptive techniques and equipment, coping skills, pain control, education.   Goals are: Supervision/Min A. 9. OT will assess and treat for/with: ADL's, functional mobility, safety, upper extremity strength, adaptive techniques and equipment, ego support, and community reintegration.   Goals are: Supervision/Min A. Therapy may proceed with showering this patient. 10. SLP will assess and treat for/with: cognition, language.   Goals are: Min A. 11. Case Management and Social Worker will assess and treat for psychological issues and discharge planning. 12. Team conference will be held weekly to assess progress toward goals and to determine barriers to discharge. 13. Patient will receive at least 3 hours of therapy per day at least 5 days per weeek. 14. ELOS: 10-15 days.       15. Prognosis:  good  Delice Lesch, MD, ABPMR Lavon Paganini Angiulli, PA-C 10/28/2017

## 2017-10-29 NOTE — Progress Notes (Signed)
Physical Therapy Treatment Patient Details Name: Ian [name] MRN: [id number] DOB: [birth year] Today's Date: 10/29/2017    History of Present Illness 84 y.o. male admitted after a fall and possible syncope. Pt found to have rhabdomyolysis, leukocytosis, and acute right occipital lobe infarct. PMH includes HTN, benign parathyroid neoplasm, calculus of kidney, and possible hx of dementia?.     PT Comments    Patient received in bed with family present, mildly aggravated but willing to participate with PT this morning. Began with bed exercises for functional strengthening and then progressed to sitting at EOB; he required ModA for the transfer but once having reached edge of bed was able to maintain with Min guard to S. Attempted standing and gait, however patient becoming agitated over combing/idea of washing hair and adamantly refused, impulsively got himself back in bed with Min guard and assist to manage lines. He was left in bed with CNA and family present, all needs otherwise met.     Follow Up Recommendations  CIR     Equipment Recommendations  None recommended by PT(defer to next venue )    Recommendations for Other Services Rehab consult;OT consult     Precautions / Restrictions Precautions Precautions: Fall Restrictions Weight Bearing Restrictions: No    Mobility  Bed Mobility Overal bed mobility: Needs Assistance Bed Mobility: Supine to Sit;Sit to Supine     Supine to sit: Mod assist Sit to supine: Min guard   General bed mobility comments: ModA to bring legs around, trunk to EOB and upright   Transfers Overall transfer level: Needs assistance               General transfer comment: patient refused (family had accidentally gotten patient a bit angry and agitated at EOB, otherwise he seemed receptive to standing/walking)  Ambulation/Gait             General Gait Details: patient refused, agitated    Stairs            Wheelchair Mobility     Modified Rankin (Stroke Patients Only)       Balance Overall balance assessment: Needs assistance Sitting-balance support: Feet supported;Bilateral upper extremity supported Sitting balance-Leahy Scale: Fair     Standing balance support: During functional activity;Bilateral upper extremity supported Standing balance-Leahy Scale: Poor Standing balance comment: reliant on bil UE support                            Cognition Arousal/Alertness: Awake/alert Behavior During Therapy: WFL for tasks assessed/performed Overall Cognitive Status: Impaired/Different from baseline Area of Impairment: Attention;Following commands;Safety/judgement;Awareness;Problem solving                 Orientation Level: Time Current Attention Level: Sustained   Following Commands: Follows one step commands with increased time;Follows one step commands consistently Safety/Judgement: Decreased awareness of safety;Decreased awareness of deficits Awareness: Emergent Problem Solving: Slow processing;Decreased initiation;Difficulty sequencing General Comments: pt requiring cues and increased time throughout session      Exercises General Exercises - Lower Extremity Ankle Circles/Pumps: Both;5 reps;Supine Heel Slides: Both;5 reps Hip ABduction/ADduction: Both;10 reps Toe Raises: Both;10 reps Other Exercises Other Exercises: supine hip extension press 1x5 B     General Comments        Pertinent Vitals/Pain Pain Assessment: Faces Pain Score: 6  Pain Location: R UE and LE Pain Descriptors / Indicators: Aching;Grimacing Pain Intervention(s): Monitored during session;Limited activity within patient's tolerance;Repositioned    Home Living  Prior Function            PT Goals (current goals can now be found in the care plan section) Acute Rehab PT Goals Patient Stated Goal: to go home PT Goal Formulation: With patient/family Time For Goal  Achievement: 11/10/17 Potential to Achieve Goals: Good Progress towards PT goals: Progressing toward goals    Frequency    Min 4X/week      PT Plan Current plan remains appropriate    Co-evaluation              AM-PAC PT "6 Clicks" Daily Activity  Outcome Measure  Difficulty turning over in bed (including adjusting bedclothes, sheets and blankets)?: Unable Difficulty moving from lying on back to sitting on the side of the bed? : Unable Difficulty sitting down on and standing up from a chair with arms (e.g., wheelchair, bedside commode, etc,.)?: Unable Help needed moving to and from a bed to chair (including a wheelchair)?: A Lot Help needed walking in hospital room?: A Lot Help needed climbing 3-5 steps with a railing? : Total 6 Click Score: 8    End of Session Equipment Utilized During Treatment: Gait belt Activity Tolerance: Treatment limited secondary to agitation Patient left: in bed;with bed alarm set;with nursing/sitter in room;with family/visitor present   PT Visit Diagnosis: Unsteadiness on feet (R26.81);Other abnormalities of gait and mobility (R26.89);History of falling (Z91.81);Muscle weakness (generalized) (M62.81);Difficulty in walking, not elsewhere classified (R26.2)     Time: 1100-1118 PT Time Calculation (min) (ACUTE ONLY): 18 min  Charges:  $Therapeutic Activity: 8-22 mins                    G Codes:       Deniece Ree PT, DPT, CBIS  Supplemental Physical Therapist [place]   Pager [phone]

## 2017-10-29 NOTE — Progress Notes (Signed)
Inpatient Rehabilitation  Met with patient and spouse at bedside to discuss team's recommendation for IP Rehab.  Shared booklets, insurance verification letter, and answered questions.  Discussed anticipated outcomes as well as likely need for hired caregiver given patient's needs and spouse desire to maintain her current lifestyle.  Patient also agreeable to rehab in order to maximize his independence and return home.  I have received medical clearance, insurance authorization, and have a bed to offer.  Will proceed with IP Rehab admission today.  Call if questions.    Carmelia Roller., CCC/SLP Admission Coordinator  [facility]  Cell [phone]

## 2017-10-29 NOTE — PMR Pre-admission (Addendum)
PMR Admission Coordinator Pre-Admission Assessment  Patient: Ian [name] is an 84 y.o., male MRN: [id number] DOB: Mar 09, 1926 Height: 5\' 11"  (180.3 cm) Weight: 78.2 kg (172 lb 6.4 oz)              Insurance Information HMO: X    PPO:      PCP:      IPA:      80/20:      OTHER:  PRIMARY: UHC Medicare       Policy#: [id number]      Subscriber: Self CM Name: Ian [name]       Phone#: [phone], or Sharee Pimple [phone]     Fax#: [phone] Pre-Cert#: [id number]      Employer: Retired Benefits:  Phone #: [phone]     Name: Verified online at [email or web address] Eff. Date: 08/26/17     Deduct: $0      Out of Pocket Max: $4400      Life Max: N/A CIR: $345 a day, days 1-5; $0 a day, days 6+      SNF: $0 a day, days 1-20; $160 a day, days 21-48; $0 a day, days 49-100 Outpatient: Necessity      Co-Pay: $40 per visit  Home Health: Necessity, 100%      Co-Pay: $0 DME: 80%     Co-Pay: 20% Providers: In-network   SECONDARY: None      Policy#:       Subscriber:  CM Name:       Phone#:      Fax#:  Pre-Cert#:       Employer:  Benefits:  Phone #:      Name:  Eff. Date:      Deduct:       Out of Pocket Max:       Life Max:  CIR:       SNF:  Outpatient:      Co-Pay:  Home Health:       Co-Pay:  DME:      Co-Pay:   Medicaid Application Date:       Case Manager:  Disability Application Date:       Case Worker:   Emergency Contact Information Contact Information    Name Relation Home Work [place] Spouse [phone]  [phone]     Current Medical History  Patient Admitting Diagnosis: Right occipital infarct   History of Present Illness: Ian [name] an 84 y.o.right handed malewith history of hypertension and dementia. Per chart reviewand wife,patient lives with spouse, who works. Reported be independent prior to admission with some reported memory loss in the recent months. 2 level home 3 steps to entry. Presented 10/25/2017 with altered mental status as well as reported fall possible  syncopal event. Patient was down for approximately 16 hours before being found. CT/MRI the brainreviewed, showing right occipital infarct. Per report,12 mm focus of reduced diffusion in the right occipital lobe, acute early subacute infarct. No hemorrhage or mass effect. X-ray of hip and shoulder negative. Mildly elevated troponin 0.19 felt to be related to demand ischemia. CT angiogram head and neck showed no large vessel occlusion or significant stenosis. Carotid Dopplers with no evidence of stenosis bilaterally. BUN 24, creatinine 1.32, CK elevated5905, UDS negative, lactic acid 5.64. Patient placed on IV fluids. Echocardiogram with ejection fraction of 94% grade 1 diastolic dysfunction. EEG negative for seizure. Neurology follow-up placed on aspirin and Plavix 3 weeks then Plavix alone. Plan 30 day monitor on discharge. CK improved 825-377. Full liquid diet  and advanced as tolerated. Physical and occupational therapy evaluations completed 10/27/2017 with recommendations of physical medicine rehabilitation consult. Patient was admitted for a comprehensive rehabilitation program 10/29/17.   NIH Total: 4    Past Medical History  Past Medical History:  Diagnosis Date  . 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 Calculus of kidney 03/31/2013   Overview:  IMPRESSION: see ER note   . Leukocytosis 10/26/2017  . Loss of weight 10/26/2017  . Multiple leg contusions, unspecified laterality, sequela 10/26/2017  . Scalp hematoma, subsequent encounter 10/26/2017    Family History  family history is not on file.  Prior Rehab/Hospitalizations:  Has the patient had major surgery during 100 days prior to admission? No  Current Medications   Current Facility-Administered Medications:  .  ALPRAZolam Duanne Moron) tablet 0.25 mg, 0.25 mg, Oral, PRN, Ian Coombe, MD, 0.25 mg at 10/26/17 2134 .  amLODipine (NORVASC) tablet 10 mg, 10 mg, Oral, Daily, Abraham, Sherin, DO, 10 mg at 10/29/17 1007 .  [START ON  10/30/2017] aspirin chewable tablet 81 mg, 81 mg, Oral, Daily, Ian Hilding, MD .  atorvastatin (LIPITOR) tablet 40 mg, 40 mg, Oral, q1800, Mayo, Pete Pelt, MD, 40 mg at 10/28/17 1725 .  [START ON 10/30/2017] clopidogrel (PLAVIX) tablet 75 mg, 75 mg, Oral, Daily, Ian Hilding, MD .  fluticasone [facility]) 50 MCG/ACT nasal spray 2 spray, 2 spray, Each Nare, Daily, McDiarmid, Blane Ohara, MD, 2 spray at 10/29/17 1008 .  levothyroxine (SYNTHROID, LEVOTHROID) tablet 25 mcg, 25 mcg, Oral, QAC breakfast, Ian Hilding, MD, 25 mcg at 10/29/17 0730 .  nitroGLYCERIN (NITROSTAT) SL tablet 0.4 mg, 0.4 mg, Sublingual, Q5 Min x 3 PRN, Ian Coombe, MD .  ondansetron [facility]) injection 4 mg, 4 mg, Intravenous, Q6H PRN, Ian Coombe, MD  Patients Current Diet: Fall precautions Diet full liquid Room service appropriate? Yes; Fluid consistency: Thin Diet - low sodium heart healthy  Precautions / Restrictions Precautions Precautions: Fall Restrictions Weight Bearing Restrictions: No   Has the patient had 2 or more falls or a fall with injury in the past year?Yes  Prior Activity Level Community (5-7x/wk): Prior to admission patient was driving and fully independent at home.  He had been working out at [employer] most days up until a few weeks ago, he also did the grocery shopping as well as managing his own medications.    Home Assistive Devices / Equipment Home Assistive Devices/Equipment: None  Prior Device Use: Indicate devices/aids used by the patient prior to current illness, exacerbation or injury? None of the above  Prior Functional Level Prior Function Level of Independence: Independent Comments: wife notes some forgetfulness in recent months  Self Care: Did the patient need help bathing, dressing, using the toilet or eating? Independent  Indoor Mobility: Did the patient need assistance with walking from room to room (with or without device)? Independent  Stairs: Did the patient  need assistance with internal or external stairs (with or without device)? Independent  Functional Cognition: Did the patient need help planning regular tasks such as shopping or remembering to take medications? Independent  Current Functional Level Cognition  Overall Cognitive Status: Impaired/Different from baseline Current Attention Level: Sustained Orientation Level: Oriented to person, Oriented to time, Disoriented to place, Disoriented to situation Following Commands: Follows one step commands with increased time, Follows one step commands consistently Safety/Judgement: Decreased awareness of safety, Decreased awareness of deficits General Comments: pt requiring cues and increased time throughout session    Extremity Assessment (includes Sensation/Coordination)  Upper  Extremity Assessment: RUE deficits/detail, LUE deficits/detail RUE Deficits / Details: Poor shoulder A/PROM due to pain. Shoulder forward flexion 0-50 degrees. Unable to perform abduction and external rotation. Decreased grasp strength. Unable to bring hand to face RUE: Unable to fully assess due to pain RUE Coordination: decreased fine motor, decreased gross motor LUE Deficits / Details: Decreased grasp and coordination. Poor grasp and pinch strength. Decreased finger opposition and required increased time and effort. LUE Coordination: decreased fine motor, decreased gross motor  Lower Extremity Assessment: Defer to PT evaluation RLE Deficits / Details: offloading during functional activity likely due to pain    ADLs  Overall ADL's : Needs assistance/impaired Eating/Feeding: Set up, Supervision/ safety, Sitting Grooming: Set up, Supervision/safety, Sitting Upper Body Bathing: Moderate assistance, Sitting Lower Body Bathing: Maximal assistance, Sit to/from stand Upper Body Dressing : Minimal assistance, Sitting Upper Body Dressing Details (indicate cue type and reason): Min A for donning new gown.  Lower Body  Dressing: Maximal assistance, +2 for safety/equipment, Sit to/from stand Lower Body Dressing Details (indicate cue type and reason): Max A for standing balance. Pt able to bend forward to pull up sock with LUE. RUE with limited reach due to pain Functional mobility during ADLs: Maximal assistance, +2 for physical assistance, Rolling walker(side steps to [facility]) General ADL Comments: Pt demosntrating poor fucntional performance. Max A for LB ADLs and present with poor balance, vision, and cognition    Mobility  Overal bed mobility: Needs Assistance Bed Mobility: Supine to Sit, Sit to Supine Supine to sit: Mod assist Sit to supine: Min guard General bed mobility comments: ModA to bring legs around, trunk to EOB and upright     Transfers  Overall transfer level: Needs assistance Equipment used: 2 person hand held assist Transfers: Sit to/from Stand Sit to Stand: Min assist, +2 physical assistance, Mod assist  Lateral/Scoot Transfers: Min guard General transfer comment: patient refused (family had accidentally gotten patient a bit angry and agitated at EOB, otherwise he seemed receptive to standing/walking)    Ambulation / Gait / Stairs / Wheelchair Mobility  Ambulation/Gait Ambulation/Gait assistance: Mod assist, +2 physical assistance Ambulation Distance (Feet): 5 Feet Assistive device: 2 person hand held assist Gait Pattern/deviations: Step-to pattern, Shuffle, Decreased stride length, Drifts right/left, Trunk flexed General Gait Details: patient refused, agitated  Gait velocity: decreased Gait velocity interpretation: Below normal speed for age/gender    Posture / Balance Dynamic Sitting Balance Sitting balance - Comments: Able to reach forward to adjust sock Balance Overall balance assessment: Needs assistance Sitting-balance support: Feet supported, Bilateral upper extremity supported Sitting balance-Leahy Scale: Fair Sitting balance - Comments: Able to reach forward to adjust  sock Standing balance support: During functional activity, Bilateral upper extremity supported Standing balance-Leahy Scale: Poor Standing balance comment: reliant on bil UE support    Special needs/care consideration BiPAP/CPAP: No CPM: No Continuous Drip IV: No Dialysis: No         Life Vest: No Oxygen: No Special Bed: No, but rail pads on bed rails  Trach Size: No Wound Vac (area): No       Skin: Bruises to bilateral upper extremities right>left                               Bowel mgmt: No BM since admission  Bladder mgmt: Incontinent with external foley  Diabetic mgmt: No, HgbA1c 4.8     Previous Home Environment Living Arrangements: Spouse/significant other Available Help at Discharge:  Family Type of Home: House Home Layout: Two level, Able to live on main level with bedroom/bathroom Home Access: Stairs to enter Entrance Stairs-Number of Steps: 3 Bathroom Shower/Tub: Multimedia programmer: Ingenio: No  Discharge Living Setting Plans for Discharge Living Setting: Patient's home, Lives with (comment)(Spouse) Type of Home at Discharge: House Discharge Home Layout: Two level, Able to live on main level with bedroom/bathroom(Spouse sleeps on the main level and patient sleeps on 2nd) Alternate Level Stairs-Rails: Left Alternate Level Stairs-Number of Steps: 14 Discharge Home Access: Stairs to enter Entrance Stairs-Rails: Right Entrance Stairs-Number of Steps: 5 Discharge Bathroom Shower/Tub: Walk-in shower(2nd floor, tub/shower on main ) Discharge Bathroom Toilet: Standard Discharge Bathroom Accessibility: No Does the patient have any problems obtaining your medications?: No  Social/Family/Support Systems Patient Roles: Spouse Contact Information: Spouse: Costella Hatcher  Anticipated Caregiver: Spouse, friends, and discussed likely need for hired caregivers if spouse wishes to maintain her active lifestyle  Anticipated Caregiver's Contact  Information: [phone] Ability/Limitations of Caregiver: Spouse teaches at [employer] and [employer] enjoys concerts and lectures and working out at the [employer]: Intermittent(but discussed the need to fill time with friends/caregiver ) Discharge Plan Discussed with Primary Caregiver: Yes Is Caregiver In Agreement with Plan?: Yes Does Caregiver/Family have Issues with Lodging/Transportation while Pt is in Rehab?: No  Goals/Additional Needs Patient/Family Goal for Rehab: PT/OT/SLP: Supervision-Min A Expected length of stay: 13-18 days  Cultural Considerations: None Dietary Needs: Full liquid diet  Equipment Needs: TBD Special Service Needs: Patient with sitter at bedside today during my visit Additional Information: May benefit from a telesitter on CIR Pt/Family Agrees to Admission and willing to participate: Yes Program Orientation Provided & Reviewed with Pt/Caregiver Including Roles  & Responsibilities: Yes Additional Information Needs: Spouse requests information on hired caregiver  Information Needs to be Provided By: CSW  Barriers to Discharge: Home environment access/layout(2nd floor bed and bath PTA, but potential with main )  Decrease burden of Care through IP rehab admission: No  Possible need for SNF placement upon discharge: Potentially  Patient Condition: This patient's condition remains as documented in the consult dated 10/27/17 at 4:35pm, in which the Rehabilitation Physician determined and documented that the patient's condition is appropriate for intensive rehabilitative care in an inpatient rehabilitation facility. Will admit to inpatient rehab today.  Preadmission Screen Completed By:  Gunnar Fusi, 10/29/2017 2:18 PM ______________________________________________________________________   Discussed status with Dr. Posey Pronto on 10/29/17 at 1510 and received telephone approval for admission today.  Admission Coordinator:  Gunnar Fusi, time 1510/Date 10/29/17

## 2017-10-29 NOTE — Care Management Note (Signed)
Case Management Note  Patient Details  Name: Stonewall Doss MRN: [id number] Date of Birth: 09-15-1925  Subjective/Objective:                    Action/Plan: Pt discharging to CIR. CM signing off.  Expected Discharge Date:  10/29/17               Expected Discharge Plan:  [place]  In-House Referral:     Discharge planning Services  CM Consult  Post Acute Care Choice:    Choice offered to:     DME Arranged:    DME Agency:     HH Arranged:    HH Agency:     Status of Service:  Completed, signed off  If discussed at [employer], dates discussed:    Additional Comments:  Pollie Friar, RN 10/29/2017, 4:43 PM

## 2017-10-29 NOTE — Progress Notes (Signed)
Patient ID: Ian [name], male   DOB: 05/14/1926, 84 y.o.   MRN: [id number] Patient admitted to [phone] via bed, escorted by nursing staff.  Patient irritable but follows commands.  Condom catheter was leaking, so nursing staff removed.  Small amount of blood noted to be on tip of penis.  Patient unable to recall his LBM.  Informed patient of rehab process, with an emphasis on our fall prevention policy and the use of the telemonitor on our unit.  Patient declines eating now.  No apparent distress at this time.  Brita Romp, RN

## 2017-10-29 NOTE — Discharge Instructions (Signed)
You were admitted for a stroke. Please start back your aspirin and plavix on 10/30/17.   Please follow up with cardiology for your loop recorder  Please follow up with vascular for your aortic aneurysm.   Please get an outpatient thyroid ultrasound

## 2017-10-29 NOTE — Progress Notes (Signed)
STROKE TEAM PROGRESS NOTE   HPI   Ian [name] is an 84 y.o. male  HTN admitted with a reported fall, possible syncopal event at home, then laying multiple hours (approximately 16 hours) laying on floor. Presented to hospital with Rhado, Leukocytosis and Multiple electrolyte and thyroid abnormalities. MRI reveals an acute Right occipital lobe infarct. Patient is complaining of dizziness and right sided weakness.  Wife states that at baseline, the patient has had "dementia" for the past few years. He has never had a formal Neurological evaluation for this. She states that he drinks at least 3-4 times per day, with his meals and in the evenings. His baseline personality, according to the wife, is very impatient and Type A personality. "He always likes to be in charge".  When he initially feel in the bathroom the wife attempted to help him up but the patient refused. She then went to bed and allowed him to lay on the floor until later the following day. Excess alcohol intake and family dynamics may have played a role in this unusual explanation.   Date last known well: Date: 10/25/2017 Time last known well: Time: 22:00 tPA Given: No: Outside the window Modified Rankin: Rankin Score=1 NIHSS: 0    SUBJECTIVE (INTERVAL HISTORY) His wife is at the bedside.  He is sleeping in bed and wife feels he got exhausted by working with therapy. OBJECTIVE Vitals:   10/28/17 2048 10/28/17 2333 10/29/17 0401 10/29/17 0817  BP: 133/79 126/64 134/70 94/63  Pulse: 90 88 81 99  Resp: 18 18 18 15   Temp: 97.8 F (36.6 C) 98 F (36.7 C) 97.9 F (36.6 C) 98 F (36.7 C)  TempSrc: Axillary Axillary Axillary Oral  SpO2: 98% 97% 98% 94%  Weight:   172 lb 6.4 oz (78.2 kg)   Height:        CBC:  Recent Labs  Lab 10/25/17 1503  10/28/17 0929 10/29/17 0611  WBC 15.6*   < > 11.0* 9.8  NEUTROABS 13.8*  --   --   --   HGB 16.7   < > 12.9* 11.9*  HCT 46.8   < > 38.4* 34.5*  MCV 97.1   < > 99.7 98.6  PLT  215   < > 170 180   < > = values in this interval not displayed.    Basic Metabolic Panel:  Recent Labs  Lab 10/25/17 1921  10/27/17 1146 10/28/17 0929 10/29/17 0611  NA  --    < > 138 138 135  K  --    < > 3.2* 3.5 3.5  CL  --    < > 104 106 105  CO2  --    < > 23 24 21*  GLUCOSE  --    < > 116* 117* 89  BUN  --    < > 14 16 16   CREATININE  --    < > 0.86 0.76 0.75  CALCIUM  --    < > 10.2 10.3 10.0  MG 1.1*  --  1.2*  --   --    < > = values in this interval not displayed.    Lipid Panel:     Component Value Date/Time   CHOL 162 10/25/2017 2345   TRIG 66 10/25/2017 2345   HDL 86 10/25/2017 2345   CHOLHDL 1.9 10/25/2017 2345   VLDL 13 10/25/2017 2345   LDLCALC 63 10/25/2017 2345   HgbA1c:  Lab Results  Component Value Date  HGBA1C 4.8 10/25/2017   Urine Drug Screen:     Component Value Date/Time   LABOPIA NONE DETECTED 10/25/2017 1513   COCAINSCRNUR NONE DETECTED 10/25/2017 1513   LABBENZ NONE DETECTED 10/25/2017 1513   AMPHETMU NONE DETECTED 10/25/2017 1513   THCU NONE DETECTED 10/25/2017 1513   LABBARB NONE DETECTED 10/25/2017 1513    Alcohol Level     Component Value Date/Time   ETH <10 10/25/2017 1503    PHYSICAL EXAM Pleasant elderly Caucasian male sitting in bedside chair. Not in distress. He has multiple bruises from his recent fall. . Afebrile. Head is nontraumatic. Neck is supple without bruit.    Cardiac exam no murmur or gallop. Lungs are clear to auscultation. Distal pulses are well felt. Neurological Exam :  Patient is sleeping hence exam is deferred today  l. Face symmetric. Tongue midline. Moves all extremities x 4. Strength normal. Coordination normal. Sensation intact.  Gait not tested. ASSESSMENT/PLAN [name] is a 84 y.o. male with no significant past medical history found down by wife with right-sided weakness and dizziness, presenting with rhabdomyolysis, leukocytosis and multiple electrolyte and thyroid abnormalities. He did  not receive IV t-PA due to being outside the window.   Stroke:  right occipital embolic infarct, workup underway .Etiology of unexplained fall unclear as to syncopal event or arrhythmia or unwitnessed seizure. Small posttraumatic subdural hematoma  Resultant  right-sided weakness, not related to current stroke (but from lying on the floor)  CT no acute stroke. Soft tissue swelling in the scalp. Small vessel disease. multifocal mucosal thickening throughout the paranasal sinuses, left greater than right suggest chronic sinusitis.  MRI  right occipital acute/subacute infarct. Atrophy. Small vessel disease. Extensive paranasal sinus disease with air-fluid levels which may represent acute sinusitis  CTA Head and neck pending    Carotid Doppler  No B ICA stenosis, VAs antegrade   2D Echo  EF 65-70%. No source of embolus   EEG- diffuse slowing of the waking background  LDL 63. Started on Lipitor 40 by primary team  HgbA1c 4.8  Heparin 5000 units sq tid for VTE prophylaxis Fall precautions Diet full liquid Room service appropriate? Yes; Fluid consistency: Thin  aspirin 81 mg daily prior to admission, now anteverted therapy on hold due to subdural hematoma   Therapy recommendations:  CIR  Disposition:  pending   Syncope  Likely multifactorial   Hypertension   Permissive hypertension  Okay to resume home medications   Other Stroke Risk Factors  Advanced age  2-4x/d ETOH use. At risk for atrial fibrillation given etoh use   Other Active Problems  Rhabdomyolysis   Transaminitis   AKI resolved   Hip and shoulder x-ray negative, CXR neg  memory loss - check B-12 (ordered), TSH (9.181), RPR (ordered) and EEG (diffuse slowing)  Leukocytosis, 18->14.2  hypothyroid, on Synthroid  Elevated troponin, mild  Recommend avoiding dextrose-containing solutions with acute stroke if possible  Hospital day # 4  I have personally examined this patient, reviewed notes,  independently viewed imaging studies, participated in medical decision making and plan of care.ROS completed by me personally and pertinent positives fully documented  I have made any additions or clarifications directly to the above note. . I had a long discussion with the patient and his wife at the bedside regarding his unexplained fall and small occipital infarct which is embolic and  need for long-term cardiac monitoring due to strong suspicion for  AFIb . Discussed with electrophysiology team physician who recommend 30  day external heart monitor. CT scan of the head today shows resolution of the subdural hemorrhage hence recommend continue aspirin 81mg  and Plavix 75 mg daily. Greater than 50% time during this 25 minute visit was spent on counseling and coordination of care about his embolic stroke, fall and answered questions. Stroke team will sign off. Follow-up as outpatient in stroke clinic in 6 weeks. Kindly call for questions if any.  Antony Contras, MD Medical Director [place] Pager: [phone] 10/29/2017 2:04 PM  To contact Stroke Continuity provider, please refer to [email or web address] After hours, contact General Neurology

## 2017-10-29 NOTE — Progress Notes (Signed)
Pt d/c to CIR RM 4W 15, pt is stable no new concerns, report given to receiving nurse.

## 2017-10-29 NOTE — Care Management Important Message (Signed)
Important Message  Patient Details  Name: Ian [name] MRN: [id number] Date of Birth: 1926/07/01   Medicare Important Message Given:  Yes    Orbie Pyo 10/29/2017, 2:17 PM

## 2017-10-30 ENCOUNTER — Inpatient Hospital Stay (HOSPITAL_COMMUNITY): Payer: Medicare Other | Admitting: Physical Therapy

## 2017-10-30 ENCOUNTER — Inpatient Hospital Stay (HOSPITAL_COMMUNITY): Payer: Medicare Other | Admitting: Speech Pathology

## 2017-10-30 ENCOUNTER — Inpatient Hospital Stay (HOSPITAL_COMMUNITY): Payer: Medicare Other

## 2017-10-30 ENCOUNTER — Inpatient Hospital Stay (HOSPITAL_COMMUNITY): Payer: Medicare Other | Admitting: Occupational Therapy

## 2017-10-30 DIAGNOSIS — R609 Edema, unspecified: Secondary | ICD-10-CM

## 2017-10-30 DIAGNOSIS — G8191 Hemiplegia, unspecified affecting right dominant side: Secondary | ICD-10-CM

## 2017-10-30 DIAGNOSIS — I10 Essential (primary) hypertension: Secondary | ICD-10-CM

## 2017-10-30 DIAGNOSIS — T796XXS Traumatic ischemia of muscle, sequela: Secondary | ICD-10-CM

## 2017-10-30 DIAGNOSIS — I63431 Cerebral infarction due to embolism of right posterior cerebral artery: Secondary | ICD-10-CM

## 2017-10-30 LAB — CBC WITH DIFFERENTIAL/PLATELET
Basophils Absolute: 0.1 10*3/uL (ref 0.0–0.1)
Basophils Relative: 1 %
Eosinophils Absolute: 0.2 10*3/uL (ref 0.0–0.7)
Eosinophils Relative: 2 %
HCT: 36.5 % — ABNORMAL LOW (ref 39.0–52.0)
Hemoglobin: 12.6 g/dL — ABNORMAL LOW (ref 13.0–17.0)
Lymphocytes Relative: 15 %
Lymphs Abs: 1.5 10*3/uL (ref 0.7–4.0)
MCH: 33.9 pg (ref 26.0–34.0)
MCHC: 34.5 g/dL (ref 30.0–36.0)
MCV: 98.1 fL (ref 78.0–100.0)
Monocytes Absolute: 0.9 10*3/uL (ref 0.1–1.0)
Monocytes Relative: 9 %
Neutro Abs: 7.2 10*3/uL (ref 1.7–7.7)
Neutrophils Relative %: 73 %
Platelets: 211 10*3/uL (ref 150–400)
RBC: 3.72 MIL/uL — ABNORMAL LOW (ref 4.22–5.81)
RDW: 12.9 % (ref 11.5–15.5)
WBC: 9.9 10*3/uL (ref 4.0–10.5)

## 2017-10-30 LAB — COMPREHENSIVE METABOLIC PANEL
ALT: 34 U/L (ref 17–63)
AST: 45 U/L — ABNORMAL HIGH (ref 15–41)
Albumin: 2.5 g/dL — ABNORMAL LOW (ref 3.5–5.0)
Alkaline Phosphatase: 65 U/L (ref 38–126)
Anion gap: 9 (ref 5–15)
BUN: 15 mg/dL (ref 6–20)
CO2: 24 mmol/L (ref 22–32)
Calcium: 10.3 mg/dL (ref 8.9–10.3)
Chloride: 103 mmol/L (ref 101–111)
Creatinine, Ser: 0.83 mg/dL (ref 0.61–1.24)
GFR calc Af Amer: >60 mL/min (ref >60)
GFR calc non Af Amer: >60 mL/min (ref >60)
Glucose, Bld: 99 mg/dL (ref 65–99)
Potassium: 4 mmol/L (ref 3.5–5.1)
Sodium: 136 mmol/L (ref 135–145)
Total Bilirubin: 2 mg/dL — ABNORMAL HIGH (ref 0.3–1.2)
Total Protein: 5.7 g/dL — ABNORMAL LOW (ref 6.5–8.1)

## 2017-10-30 LAB — CULTURE, BLOOD (ROUTINE X 2)
Culture: NO GROWTH
Culture: NO GROWTH
Special Requests: ADEQUATE

## 2017-10-30 NOTE — Progress Notes (Deleted)
Patient refused dialysis today. He stated that he had family coming into town and that Doctors did not tell him that he would have dialysis today due to him going back to the [facility]. I let the patient know the risk and he understood. He said he would go to dialysis tomorrow.   

## 2017-10-30 NOTE — Evaluation (Signed)
Speech Language Pathology Assessment and Plan  Patient Details  Name: Ian [name] MRN: [id number] Date of Birth: Sep 23, 1925  SLP Diagnosis: Dysphagia;Cognitive Impairments  Rehab Potential: Fair ELOS: 3-4 weeks    Today's Date: 10/30/2017 SLP Individual Time: 1000-1055 SLP Individual Time Calculation (min): 55 min   Problem List:  Patient Active Problem List   Diagnosis Date Noted  . Embolic cerebral infarction ([place]) 10/29/2017  . Dementia without behavioral disturbance   . Benign essential HTN   . Diastolic dysfunction   . Hypothyroidism   . Hypokalemia   . Altered mental status   . Pressure injury of skin 10/26/2017  . Loss of weight 10/26/2017  . Elevated troponin I level 10/26/2017  . Leukocytosis 10/26/2017  . Disheveled appearance 10/26/2017  . Multiple leg contusions, unspecified laterality, sequela 10/26/2017  . Cognitive impairment 10/26/2017  . Scalp hematoma, subsequent encounter 10/26/2017  . Head injury 10/26/2017  . Fall at home, sequela 10/26/2017  . Right BBB/left ant fasc block 10/26/2017  . Acute confusional state 10/26/2017  . Dizziness and giddiness 10/26/2017  . Cerebral infarction [facility]), right occipital lobe 10/26/2017  . Elevated AST (SGOT) 10/26/2017  . Shoulder injury, right, subsequent encounter 10/26/2017  . Paranasal sinus disease 10/26/2017  . Maxillary sinusitis 10/26/2017  . Ethmoid sinusitis 10/26/2017  . Sphenoid sinusitis 10/26/2017  . Rhabdomyolysis 10/25/2017  . Benign hypertension 04/01/2013  . Neuropathy 03/31/2013   Past Medical History:  Past Medical History:  Diagnosis Date  . 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 Calculus of kidney 03/31/2013   Overview:  IMPRESSION: see ER note   . Hypertension   . Leukocytosis 10/26/2017  . Loss of weight 10/26/2017  . Multiple leg contusions, unspecified laterality, sequela 10/26/2017  . Scalp hematoma, subsequent encounter 10/26/2017  . Stroke [facility])    Past Surgical History:  History reviewed. No pertinent surgical history.  Assessment / Plan / Recommendation Clinical Impression Patient s a 84 y.o.right handed malewith history of hypertension and dementia. Per chart reviewand wife,patient lives with spouse, who works. Reported be independent prior to admission with some reported memory loss in the recent months. 2 level home 3 steps to entry. Presented 10/25/2017 with altered mental status as well as reported fall possible syncopal event. Patient was down for approximately 16 hours before being found. CT/MRI the brainreviewed, showing right occipital infarct. Per report,12 mm focus of reduced diffusion in the right occipital lobe, acute early subacute infarct. No hemorrhage or mass effect. X-ray of hip and shoulder negative. Mildly elevated troponin 0.19 felt to be related to demand ischemia. CT angiogram head and neck showed no large vessel occlusion or significant stenosis. Carotid Dopplers with no evidence of stenosis bilaterally. . BUN 24, creatinine 1.32, CK elevated5905, UDS negative, lactic acid 5.64. Patient placed on IV fluids. Echocardiogram with ejection fraction of 38% grade 1 diastolic dysfunction. EEG negative for seizure. Neurology follow-up placed on aspirin and Plavix 3 weeks then Plavix alone.  Physical and occupational therapy evaluations completed 10/27/2017 with recommendations of physical medicine rehabilitation consult. Patient was admitted for a comprehensive rehabilitation program 10/29/17.  Patient demonstrates moderate-severe cognitive impairments impacting orientation, recall, problem solving, attention and awareness which impacts his ability to complete functional and familiar tasks safely. Patient's safety is further impacted by impulsivity and low frustration tolerance with intermittent verbal agitation. Patient consumed thin liquids via cup without overt s/s of aspiration and demonstrated intermittent, mild throat clearing with regular textures,  suspect due to impulsivity with PO intake and intermittent  frustration with verbal cues. Therefore, recommend patient downgraded to Dys. 3 textures to maximize safety. Patient would benefit from skilled SLP intervention to maximize his swallowing and cognitive function and overall functional independence prior to discharge.     Skilled Therapeutic Interventions          Administered a cognitive-linguistic evaluation and BSE. Please see above for details.   SLP Assessment  Patient will need skilled Speech Lanaguage Pathology Services during CIR admission    Recommendations  SLP Diet Recommendations: Dysphagia 3 (Mech soft);Thin Liquid Administration via: Cup;No straw(per patient preference ) Medication Administration: Whole meds with liquid Supervision: Patient able to self feed;Full supervision/cueing for compensatory strategies Compensations: Minimize environmental distractions;Slow rate;Small sips/bites Postural Changes and/or Swallow Maneuvers: Seated upright 90 degrees Oral Care Recommendations: Oral care BID Recommendations for Other Services: Neuropsych consult Patient destination: Home Follow up Recommendations: 24 hour supervision/assistance;Home Health SLP Equipment Recommended: None recommended by SLP    SLP Frequency 3 to 5 out of 7 days   SLP Duration  SLP Intensity  SLP Treatment/Interventions 3-4 weeks  Minumum of 1-2 x/day, 30 to 90 minutes  Cognitive remediation/compensation;Environmental controls;Internal/external aids;Therapeutic Activities;Patient/family education;Functional tasks;Dysphagia/aspiration precaution training;Cueing hierarchy    Pain Pain Assessment Pain Assessment: No/denies pain Pain Score: 0-No pain  Prior Functioning Type of Home: House  Lives With: Spouse Available Help at Discharge: (unsure, no family present at eval)  Function:  Eating Eating     Eating Assist Level: Supervision or verbal cues;More than reasonable amount of time            Cognition Comprehension Comprehension assist level: Understands basic 50 - 74% of the time/ requires cueing 25 - 49% of the time  Expression   Expression assist level: Expresses basic 50 - 74% of the time/requires cueing 25 - 49% of the time. Needs to repeat parts of sentences.  Social Interaction Social Interaction assist level: Interacts appropriately 50 - 74% of the time - May be physically or verbally inappropriate.  Problem Solving Problem solving assist level: Solves basic 25 - 49% of the time - needs direction more than half the time to initiate, plan or complete simple activities  Memory Memory assist level: Recognizes or recalls less than 25% of the time/requires cueing greater than 75% of the time   Short Term Goals: Week 1: SLP Short Term Goal 1 (Week 1): Patient will consume current diet with minimal overt s/s of aspiration with Mod A verbal cues for use of small bites/sips and a slow rate of self-feeding.  SLP Short Term Goal 2 (Week 1): Patient will recall new, daily information with Mod A multimodal cues.  SLP Short Term Goal 3 (Week 1): Patient will demonstrate functional problem solving for basic and familiar tasks with Mod A multimodal cues.  SLP Short Term Goal 4 (Week 1): Patient will identify 2 cognitive deficits with Max A multimodal cues.  SLP Short Term Goal 5 (Week 1): Patient will demonstrate selective attention in a mildly distracting enviornment for 30 minutes with Mod A verbal cues for redirection.   Refer to Care Plan for Long Term Goals  Recommendations for other services: Neuropsych  Discharge Criteria: Patient will be discharged from SLP if patient refuses treatment 3 consecutive times without medical reason, if treatment goals not met, if there is a change in medical status, if patient makes no progress towards goals or if patient is discharged from hospital.  The above assessment, treatment plan, treatment alternatives and goals were discussed and  mutually  agreed upon: by patient and family  Darielys Giglia 10/30/2017, 12:03 PM

## 2017-10-30 NOTE — Progress Notes (Signed)
Patient information reviewed and entered into eRehab system by Jaretzi Droz, RN, CRRN, PPS Coordinator.  Information including medical coding and functional independence measure will be reviewed and updated through discharge.     Per nursing patient was given "Data Collection Information Summary for Patients in Inpatient Rehabilitation Facilities with attached "Privacy Act Statement-Health Care Records" upon admission.  

## 2017-10-30 NOTE — Plan of Care (Signed)
  Progressing RH SKIN INTEGRITY RH STG SKIN FREE OF INFECTION/BREAKDOWN Description Patients skin will remain free from further infection or breakdown with min assist.   10/30/2017 0932 - Progressing by Evelena Asa, RN   Not Progressing RH BOWEL ELIMINATION RH STG MANAGE BOWEL WITH ASSISTANCE Description STG Manage Bowel with mod I Assistance.  10/30/2017 3557 - Not Progressing by Evelena Asa, RN RH STG MANAGE BOWEL W/MEDICATION W/ASSISTANCE Description STG Manage Bowel with Medication with mod I Assistance.  10/30/2017 3220 - Not Progressing by Evelena Asa, RN RH SAFETY RH STG ADHERE TO SAFETY PRECAUTIONS W/ASSISTANCE/DEVICE Description STG Adhere to Safety Precautions With min Assistance/Device.  10/30/2017 2542 - Not Progressing by Evelena Asa, RN RH COGNITION-NURSING RH STG USES MEMORY AIDS/STRATEGIES W/ASSIST TO PROBLEM SOLVE Description STG Uses Memory Aids/Strategies With min Assistance to Problem Solve.  10/30/2017 7062 - Not Progressing by Evelena Asa, RN RH KNOWLEDGE DEFICIT RH STG INCREASE KNOWLEDGE OF HYPERTENSION 10/30/2017 3762 - Not Progressing by Evelena Asa, RN

## 2017-10-30 NOTE — Care Management (Signed)
[facility] Individual Statement of Services  Patient Name:  Aleem Elza  Date:  10/30/2017  Welcome to the [facility].  Our goal is to provide you with an individualized program based on your diagnosis and situation, designed to meet your specific needs.  With this comprehensive rehabilitation program, you will be expected to participate in at least 3 hours of rehabilitation therapies Monday-Friday, with modified therapy programming on the weekends.  Your rehabilitation program will include the following services:  Physical Therapy (PT), Occupational Therapy (OT), Speech Therapy (ST), 24 hour per day rehabilitation nursing, Therapeutic Recreaction (TR), Neuropsychology, Case Management (Social Worker), Rehabilitation Medicine, Nutrition Services and Pharmacy Services  Weekly team conferences will be held on Tuesdays to discuss your progress.  Your Social Worker will talk with you frequently to get your input and to update you on team discussions.  Team conferences with you and your family in attendance may also be held.  Expected length of stay: 3 weeks    Overall anticipated outcome: minimal assistance  Depending on your progress and recovery, your program may change. Your Social Worker will coordinate services and will keep you informed of any changes. Your Social Worker's name and contact numbers are listed  below.  The following services may also be recommended but are not provided by the [facility] will be made to provide these services after discharge if needed.  Arrangements include referral to agencies that provide these services.  Your insurance has been verified to be:  [facility] Medicare Your primary doctor is:  Dr. Coletta Memos  Pertinent information will be  shared with your doctor and your insurance company.  Social Worker:  [place], [place] or [phone]   Information discussed with and copy given to patient by: Lennart Pall, 10/30/2017, 4:18 PM

## 2017-10-30 NOTE — Progress Notes (Signed)
Physical Medicine and Rehabilitation Consult Reason for Consult: Decreased functional mobility Referring Physician: Family medicine   HPI: Ian [name] is a 84 y.o. right handed male with history of hypertension. Per chart review and wife, patient lives with spouse, who works. Reported be independent prior to admission with some reported memory loss in the recent months. 2 level home 3 steps to entry. Presented 10/25/2017 with right lower extremity weakness and altered mental status as well as reported fall possible syncopal event. Patient was down for approximately 16 hours before being found. CT/MRI the brain reviewed, showing right occipital infarct. Per report, 12 mm focus of reduced diffusion in the right occipital lobe, acute early subacute infarct. No hemorrhage or mass effect. CT angiogram head and neck are pending as well as carotid Dopplers. BUN 24, creatinine 1.32, CK elevated 5905, UDS negative, lactic acid 5.64. Patient placed on IV fluids. Echocardiogram with ejection fraction of 41% grade 1 diastolic dysfunction. EEG negative for seizure. Neurology follow-up placed on aspirin and Plavix 3 weeks then Plavix alone. CK improved 825. Subcutaneous heparin for DVT prophylaxis. Full liquid diet and advanced as tolerated. Physical therapy evaluation completed 10/27/2017 with recommendations of physical medicine rehabilitation consult.   Review of Systems  Constitutional: Negative for chills and fever.  HENT: Negative for hearing loss.   Eyes: Negative for blurred vision and double vision.  Respiratory: Negative for cough and shortness of breath.   Cardiovascular: Negative for chest pain and palpitations.  Gastrointestinal: Positive for constipation. Negative for nausea and vomiting.  Genitourinary: Positive for urgency.  Musculoskeletal: Positive for falls.  Skin: Negative for rash.  Neurological: Positive for focal weakness.  Psychiatric/Behavioral: Positive for memory loss.  All  other systems reviewed and are negative.      Past Medical History:  Diagnosis Date  . 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 Calculus of kidney 03/31/2013   Overview:  IMPRESSION: see ER note   . Leukocytosis 10/26/2017  . Loss of weight 10/26/2017  . Multiple leg contusions, unspecified laterality, sequela 10/26/2017  . Scalp hematoma, subsequent encounter 10/26/2017   No past surgical history. No pertinent family history of premature CVA. Social History:  reports that  has never smoked. he has never used smokeless tobacco. He reports that he does not use drugs. His alcohol history is not on file. Allergies: No Known Allergies       Medications Prior to Admission  Medication Sig Dispense Refill  . amLODipine (NORVASC) 10 MG tablet Take 10 mg by mouth daily.     Marland Kitchen aspirin EC 81 MG tablet Take 81 mg by mouth daily.     . hydrochlorothiazide (HYDRODIURIL) 25 MG tablet Take 25 mg by mouth daily.     . polyethylene glycol powder (GLYCOLAX/MIRALAX) powder Take 17 g by mouth 2 (two) times daily as needed. (Patient taking differently: Take 17 g by mouth 2 (two) times daily as needed for mild constipation. ) 3350 g 0    Home: Home Living Family/patient expects to be discharged to:: Private residence Living Arrangements: Spouse/significant other Available Help at Discharge: Family Type of Home: House Home Access: Stairs to enter Technical brewer of Steps: 3 Home Layout: Two level, Able to live on main level with bedroom/bathroom  Functional History: Prior Function Level of Independence: Independent Comments: wife notes some forgetfulness in recent months Functional Status:  Mobility: Bed Mobility Overal bed mobility: Needs Assistance Bed Mobility: Supine to Sit Supine to sit: Mod assist, HOB elevated General bed mobility comments: mod  assist for elevation of trunk to upright and rotating hips using bed pad. min assist for scooting to EOB. VCs for  sequencing Transfers Overall transfer level: Needs assistance Equipment used: 1 person hand held assist Transfers: Sit to/from Stand, Lateral/Scoot Transfers Sit to Stand: Mod assist, From elevated surface  Lateral/Scoot Transfers: Min guard General transfer comment: sit<>stand: multimodal cues and min to mod assist at times for anterior translation and boost up from bed, and extension to stand. lateral/scoot transfer to drop arm chair min guard for set up and safety Ambulation/Gait General Gait Details: pt unable/unwilling to attempt at this time  ADL:  Cognition: Cognition Overall Cognitive Status: Impaired/Different from baseline Orientation Level: Oriented to time, Disoriented to place, Oriented to person, Disoriented to situation Cognition Arousal/Alertness: Lethargic Behavior During Therapy: Flat affect Overall Cognitive Status: Impaired/Different from baseline Area of Impairment: Orientation, Attention, Following commands, Safety/judgement, Awareness, Problem solving Orientation Level: Time Current Attention Level: Sustained Following Commands: Follows one step commands inconsistently, Follows one step commands with increased time Safety/Judgement: Decreased awareness of safety, Decreased awareness of deficits Awareness: Emergent Problem Solving: Slow processing, Decreased initiation, Difficulty sequencing, Requires verbal cues, Requires tactile cues General Comments: pt falling asleep intermittently during beginning of session. decreased attention requirig continuous multimodal cues to complete tasks  Blood pressure (!) 146/82, pulse (!) 110, temperature 98 F (36.7 C), temperature source Oral, resp. rate 20, height 5\' 11"  (1.803 m), weight 74.8 kg (164 lb 14.5 oz), SpO2 100 %. Physical Exam  Vitals reviewed. Constitutional: He appears well-developed and well-nourished.  Sitter at bedside  HENT:  Head: Normocephalic.  Right Ear: External ear normal.  Left Ear:  External ear normal.  Eyes: EOM are normal. Right eye exhibits no discharge. Left eye exhibits no discharge.  Pupils reactive to light  Neck: Normal range of motion. Neck supple. No thyromegaly present.  Cardiovascular: Normal rate, regular rhythm and normal heart sounds.  Respiratory:  Fair inspiratory effort with some upper airway congestion  GI: Soft. Bowel sounds are normal. He exhibits no distension.  Musculoskeletal:  No edema or tenderness in extremities  Neurological: He is alert.  HOH Followed simple commands.  A&Ox3 with cues Very limited historian Motor: LUE: 5/5 proximal to distal LLE: HF, KE 4-/5, ADF 4-/5 RUE: 3+/5 proximal to distal RLE: HF, KE 2/5, ADF 0/5 Sensation intact to light touch  Skin: Skin is warm and dry.  Scattered hematomas  Psychiatric: He is slowed.  Assessment/Plan: Diagnosis: right occipital infarct Labs and images independently reviewed.  Records reviewed and summated above. Stroke: Continue secondary stroke prophylaxis and Risk Factor Modification listed below:   Antiplatelet therapy:   Blood Pressure Management:  Continue current medication with prn's with permisive HTN per primary team Statin Agent:   Right sided hemiparesis: fit for orthosis to prevent contractures (PRAFO) Motor recovery: Fluoxetine   1. Does the need for close, 24 hr/day medical supervision in concert with the patient's rehab needs make it unreasonable for this patient to be served in a less intensive setting? Yes  2. Co-Morbidities requiring supervision/potential complications: HTN (monitor and provide prns in accordance with increased physical exertion and pain), diastolic dysfunction (monitor for signs/symptoms of fluid overload), Tachycardia (monitor in accordance with pain and increasing activity), hypothyroidism (cont meds, ensure appropriate mood and energy level for therapies), hypokalemia (continue to monitor and replete as necessary), leukocytosis (cont to  monitor for signs and symptoms of infection, further workup if indicated) 3. Due to safety, skin/wound care, disease management, medication administration and patient  education, does the patient require 24 hr/day rehab nursing? Yes 4. Does the patient require coordinated care of a physician, rehab nurse, PT (1-2 hrs/day, 5 days/week), OT (1-2 hrs/day, 5 days/week) and SLP (1-2 hrs/day, 5 days/week) to address physical and functional deficits in the context of the above medical diagnosis(es)? Yes Addressing deficits in the following areas: balance, endurance, locomotion, strength, transferring, bathing, dressing, toileting, cognition, speech, swallowing and psychosocial support 5. Can the patient actively participate in an intensive therapy program of at least 3 hrs of therapy per day at least 5 days per week? Yes 6. The potential for patient to make measurable gains while on inpatient rehab is excellent 7. Anticipated functional outcomes upon discharge from inpatient rehab are supervision and min assist  with PT, supervision and min assist with OT, supervision and min assist with SLP. 8. Estimated rehab length of stay to reach the above functional goals is: 13-18 days. 9. Anticipated D/C setting: Home 10. Anticipated post D/C treatments: HH therapy and Home excercise program 11. Overall Rehab/Functional Prognosis: good  RECOMMENDATIONS: This patient's condition is appropriate for continued rehabilitative care in the following setting: CIR after completion of medical workup Patient has agreed to participate in recommended program. Potentially Note that insurance prior authorization may be required for reimbursement for recommended care.  Comment: Rehab Admissions Coordinator to follow up.  Delice Lesch, MD, ABPMR Lavon Paganini Angiulli, PA-C 10/27/2017          Revision History                        Routing History

## 2017-10-30 NOTE — Progress Notes (Signed)
Social Work  Social Work Assessment and Plan  Patient Details  Name: Ian [name] MRN: [id number] Date of Birth: 06-28-1926  Today's Date: 10/30/2017  Problem List:  Patient Active Problem List   Diagnosis Date Noted  . Dysphagia, post-stroke   . Essential hypertension   . Embolic cerebral infarction ([place]) 10/29/2017  . Dementia without behavioral disturbance   . Benign essential HTN   . Diastolic dysfunction   . Hypothyroidism   . Hypokalemia   . Altered mental status   . Pressure injury of skin 10/26/2017  . Loss of weight 10/26/2017  . Elevated troponin I level 10/26/2017  . Leukocytosis 10/26/2017  . Disheveled appearance 10/26/2017  . Multiple leg contusions, unspecified laterality, sequela 10/26/2017  . Cognitive impairment 10/26/2017  . Scalp hematoma, subsequent encounter 10/26/2017  . Head injury 10/26/2017  . Fall at home, sequela 10/26/2017  . Right BBB/left ant fasc block 10/26/2017  . Acute confusional state 10/26/2017  . Dizziness and giddiness 10/26/2017  . Cerebral infarction [facility]), right occipital lobe 10/26/2017  . Elevated AST (SGOT) 10/26/2017  . Shoulder injury, right, subsequent encounter 10/26/2017  . Paranasal sinus disease 10/26/2017  . Maxillary sinusitis 10/26/2017  . Ethmoid sinusitis 10/26/2017  . Sphenoid sinusitis 10/26/2017  . Rhabdomyolysis 10/25/2017  . Benign hypertension 04/01/2013  . Neuropathy 03/31/2013   Past Medical History:  Past Medical History:  Diagnosis Date  . 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 Calculus of kidney 03/31/2013   Overview:  IMPRESSION: see ER note   . Hypertension   . Leukocytosis 10/26/2017  . Loss of weight 10/26/2017  . Multiple leg contusions, unspecified laterality, sequela 10/26/2017  . Scalp hematoma, subsequent encounter 10/26/2017  . Stroke [facility])    Past Surgical History: History reviewed. No pertinent surgical history. Social History:  reports that  has never smoked. he has never used  smokeless tobacco. He reports that he drinks about 3.6 - 7.2 oz of alcohol per week. He reports that he does not use drugs.  Family / Support Systems Marital Status: Married How Long?: 30 yrs Patient Roles: Spouse, Parent Spouse/Significant Other: wife, Teren Zurcher @ (H) [phone] or (C) [phone] Children: Pt has two adult sons both living in [place] Anticipated Caregiver: Spouse, friends, and discussed likely need for hired caregivers if spouse wishes to maintain her active lifestyle  Ability/Limitations of Caregiver: Spouse teaches at [employer] and [employer] enjoys concerts and lectures and working out at the [employer] Availability: Intermittent Family Dynamics: Wife very clear with this SW that she is NOT able to provide 24/7 assistance.  States, "I love him but I have to be able to live my life."  During assessment interview, pt very critical of wife and insistent that she take him home.  When she attempts to explain that he needs to participate in therapies he becomes angry and turns away from her.  Social History Preferred language: English Religion: None Cultural Background: NA Read: Yes Write: Yes Employment Status: Retired Freight forwarder Issues: None Guardian/Conservator: None-Per MD, patient is capable of making decisions on his own behalf.   Abuse/Neglect Abuse/Neglect Assessment Can Be Completed: Yes Physical Abuse: Denies Verbal Abuse: Denies Sexual Abuse: Denies Exploitation of patient/patient's resources: Denies Self-Neglect: Denies  Emotional Status Pt's affect, behavior adn adjustment status: Patient lying in bed and very resistant to completing interview with me.  Continuously insists he can go home and "complete his therapy Y."  Could not engage patient despite numerous efforts to gather information from him.  Wife does provide basic information.  Patient clearly angry about not being allowed to go home but does not have a good understanding of the  purpose of CIR at this time.  Will benefit from referral for neuropsychology while he is here. Recent Psychosocial Issues: Wife notes that patient has been declining in overall function beginning a few weeks prior to admit.  Up until that point, patient was independent, driving locally and enjoyed [facility] [place] the [facility]. Pyschiatric History: None Substance Abuse History: When questioned about patient's alcohol use PTA, wife states, "I mean we both the drink but we are not alcoholics."  Patient / Family Perceptions, Expectations & Goals Pt/Family understanding of illness & functional limitations: Patient denies any significant medical issues and has very poor awareness of his stroke and functional deficits.  Wife with good, general understanding of the stroke patient suffered and of his current functional limitations/need for CIR. Premorbid pt/family roles/activities: As noted, wife reports patient was fully independent up until a few weeks ago.  Wife very active in the community as she works to adjunct professor jobs. Anticipated changes in roles/activities/participation: Team fully expects patient to require at least 24/7 supervision.  Wife insistent that she is unable to assume 24/7 caregiver role and will look into hiring help. Pt/family expectations/goals: Wife simply hopeful that patient will begin to participate with our program.  Patient's obvious goal is to go home ASAP.  Community Resources [employer]: None Premorbid Home Care/DME Agencies: None Transportation available at discharge: Yes Resource referrals recommended: Neuropsychology, Support group (specify)  Discharge Planning Living Arrangements: Spouse/significant other Support Systems: Spouse/significant other Type of Residence: Private residence Insurance Resources: Chartered certified accountant Resources: [place] Referred: No Living Expenses: Own Money Management: Spouse Does the patient have any problems  obtaining your medications?: No Home Management: Mostly wife Patient/Family Preliminary Plans: Plan at time of admission is for wife to coordinate 24/7 care for patient as this is fully expected to be needed.  This Education officer, museum provided wife with a list of private duty agencies that she is to call. [place] Barriers to Discharge: Decreased caregiver support, Behavior Social Work Anticipated Follow Up Needs: HH/OP, SNF Expected length of stay: 3+ weeks  Clinical Impression Very unfortunate, elderly gentleman here following a stroke with significant functional deficits.  Upon initial interview, patient very ornery and insistent that he is "ready to go home" despite wife's attempts to calm him and explained the purpose of CIR.  Patient very disengaged with this Education officer, museum, therefore, most information gathered from spouse.  Discussed with spouse and patient that he will need to participate with program in order to reach goals and to stay in CIR.  Already discussed possibility of transfer to SNF if he does not participate.  We will monitor closely and hope to gain some rapport over the next few days.  Jereme Loren 10/30/2017, 4:18 PM

## 2017-10-30 NOTE — Progress Notes (Signed)
PMR Admission Coordinator Pre-Admission Assessment  Patient: Ian [name] is an 84 y.o., male MRN: [id number] DOB: 1926/07/25 Height: 5\' 11"  (180.3 cm) Weight: 78.2 kg (172 lb 6.4 oz)                                                                                                                                                  Insurance Information HMO: X    PPO:      PCP:      IPA:      80/20:      OTHER:  PRIMARY: U[employer] Medicare       Policy#: [id number]      Subscriber: Self CM Name: Ian [name]       Phone#: [phone], or Ian [name] [phone]     Fax#: [phone] Pre-Cert#: [id number]      Employer: Retired Benefits:  Phone #: [phone]     Name: Verified online at [email or web address] Eff. Date: 08/26/17     Deduct: $0      Out of Pocket Max: $4400      Life Max: N/A CIR: $345 a day, days 1-5; $0 a day, days 6+      SNF: $0 a day, days 1-20; $160 a day, days 21-48; $0 a day, days 49-100 Outpatient: Necessity      Co-Pay: $40 per visit  Home Health: Necessity, 100%      Co-Pay: $0 DME: 80%     Co-Pay: 20% Providers: In-network   SECONDARY: None      Policy#:       Subscriber:  CM Name:       Phone#:      Fax#:  Pre-Cert#:       Employer:  Benefits:  Phone #:      Name:  Eff. Date:      Deduct:       Out of Pocket Max:       Life Max:  CIR:       SNF:  Outpatient:      Co-Pay:  Home Health:       Co-Pay:  DME:      Co-Pay:   Medicaid Application Date:       Case Manager:  Disability Application Date:       Case Worker:   Emergency Contact Information        Contact Information    Name Relation Home Work McDade Spouse [phone]  [phone]     Current Medical History  Patient Admitting Diagnosis: Right occipital infarct   History of Present Illness: Ian [name] an 84 y.o.right handed malewith history of hypertensionand dementia. Per chart reviewand wife,patient lives with spouse, who works. Reported be independent prior to admission with some  reported memory loss in the recent months. 2 level home 3 steps to entry. Presented 10/25/2017 with altered mental status  as well as reported fall possible syncopal event. Patient was down for approximately 16 hours before being found. CT/MRI the brainreviewed, showing right occipital infarct. Per report,12 mm focus of reduced diffusion in the right occipital lobe, acute early subacute infarct. No hemorrhage or mass effect.X-ray of hip and shoulder negative. Mildly elevated troponin 0.19 felt to be related to demand ischemia.CT angiogram head and neckshowed no large vessel occlusion or significant stenosis. Carotid Dopplers with no evidence of stenosis bilaterally. BUN 24, creatinine 1.32, CK elevated5905, UDS negative, lactic acid 5.64. Patient placed on IV fluids. Echocardiogram with ejection fraction of 77% grade 1 diastolic dysfunction. EEG negative for seizure. Neurology follow-up placed on aspirin and Plavix 3 weeks then Plavix alone.Plan 30 day monitor on discharge.CK improved 825-377. Full liquid diet and advanced as tolerated. Physical and occupationaltherapy evaluationscompleted 10/27/2017 with recommendations of physical medicine rehabilitation consult.Patient was admitted for a comprehensive rehabilitation program 10/29/17.   NIH Total: 4  Past Medical History      Past Medical History:  Diagnosis Date  . 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 Calculus of kidney 03/31/2013   Overview:  IMPRESSION: see ER note   . Leukocytosis 10/26/2017  . Loss of weight 10/26/2017  . Multiple leg contusions, unspecified laterality, sequela 10/26/2017  . Scalp hematoma, subsequent encounter 10/26/2017    Family History  family history is not on file.  Prior Rehab/Hospitalizations:  Has the patient had major surgery during 100 days prior to admission? No  Current Medications   Current Facility-Administered Medications:  .  ALPRAZolam Duanne Moron) tablet 0.25 mg, 0.25 mg, Oral, PRN,  Everrett Coombe, MD, 0.25 mg at 10/26/17 2134 .  amLODipine (NORVASC) tablet 10 mg, 10 mg, Oral, Daily, Abraham, Sherin, DO, 10 mg at 10/29/17 1007 .  [START ON 10/30/2017] aspirin chewable tablet 81 mg, 81 mg, Oral, Daily, Sela Hilding, MD .  atorvastatin (LIPITOR) tablet 40 mg, 40 mg, Oral, q1800, Mayo, Pete Pelt, MD, 40 mg at 10/28/17 1725 .  [START ON 10/30/2017] clopidogrel (PLAVIX) tablet 75 mg, 75 mg, Oral, Daily, Sela Hilding, MD .  fluticasone [facility]) 50 MCG/ACT nasal spray 2 spray, 2 spray, Each Nare, Daily, McDiarmid, Blane Ohara, MD, 2 spray at 10/29/17 1008 .  levothyroxine (SYNTHROID, LEVOTHROID) tablet 25 mcg, 25 mcg, Oral, QAC breakfast, Sela Hilding, MD, 25 mcg at 10/29/17 0730 .  nitroGLYCERIN (NITROSTAT) SL tablet 0.4 mg, 0.4 mg, Sublingual, Q5 Min x 3 PRN, Everrett Coombe, MD .  ondansetron [facility]) injection 4 mg, 4 mg, Intravenous, Q6H PRN, Everrett Coombe, MD  Patients Current Diet: Fall precautions Diet full liquid Room service appropriate? Yes; Fluid consistency: Thin Diet - low sodium heart healthy  Precautions / Restrictions Precautions Precautions: Fall Restrictions Weight Bearing Restrictions: No   Has the patient had 2 or more falls or a fall with injury in the past year?Yes  Prior Activity Level Community (5-7x/wk): Prior to admission patient was driving and fully independent at home.  He had been working out at [employer] most days up until a few weeks ago, he also did the grocery shopping as well as managing his own medications.    Home Assistive Devices / Equipment Home Assistive Devices/Equipment: None  Prior Device Use: Indicate devices/aids used by the patient prior to current illness, exacerbation or injury? None of the above  Prior Functional Level Prior Function Level of Independence: Independent Comments: wife notes some forgetfulness in recent months  Self Care: Did the patient need help bathing, dressing, using the toilet  or  eating? Independent  Indoor Mobility: Did the patient need assistance with walking from room to room (with or without device)? Independent  Stairs: Did the patient need assistance with internal or external stairs (with or without device)? Independent  Functional Cognition: Did the patient need help planning regular tasks such as shopping or remembering to take medications? Independent  Current Functional Level Cognition  Overall Cognitive Status: Impaired/Different from baseline Current Attention Level: Sustained Orientation Level: Oriented to person, Oriented to time, Disoriented to place, Disoriented to situation Following Commands: Follows one step commands with increased time, Follows one step commands consistently Safety/Judgement: Decreased awareness of safety, Decreased awareness of deficits General Comments: pt requiring cues and increased time throughout session    Extremity Assessment (includes Sensation/Coordination)  Upper Extremity Assessment: RUE deficits/detail, LUE deficits/detail RUE Deficits / Details: Poor shoulder A/PROM due to pain. Shoulder forward flexion 0-50 degrees. Unable to perform abduction and external rotation. Decreased grasp strength. Unable to bring hand to face RUE: Unable to fully assess due to pain RUE Coordination: decreased fine motor, decreased gross motor LUE Deficits / Details: Decreased grasp and coordination. Poor grasp and pinch strength. Decreased finger opposition and required increased time and effort. LUE Coordination: decreased fine motor, decreased gross motor  Lower Extremity Assessment: Defer to PT evaluation RLE Deficits / Details: offloading during functional activity likely due to pain    ADLs  Overall ADL's : Needs assistance/impaired Eating/Feeding: Set up, Supervision/ safety, Sitting Grooming: Set up, Supervision/safety, Sitting Upper Body Bathing: Moderate assistance, Sitting Lower Body Bathing: Maximal assistance,  Sit to/from stand Upper Body Dressing : Minimal assistance, Sitting Upper Body Dressing Details (indicate cue type and reason): Min A for donning new gown.  Lower Body Dressing: Maximal assistance, +2 for safety/equipment, Sit to/from stand Lower Body Dressing Details (indicate cue type and reason): Max A for standing balance. Pt able to bend forward to pull up sock with LUE. RUE with limited reach due to pain Functional mobility during ADLs: Maximal assistance, +2 for physical assistance, Rolling walker(side steps to [facility]) General ADL Comments: Pt demosntrating poor fucntional performance. Max A for LB ADLs and present with poor balance, vision, and cognition    Mobility  Overal bed mobility: Needs Assistance Bed Mobility: Supine to Sit, Sit to Supine Supine to sit: Mod assist Sit to supine: Min guard General bed mobility comments: ModA to bring legs around, trunk to EOB and upright     Transfers  Overall transfer level: Needs assistance Equipment used: 2 person hand held assist Transfers: Sit to/from Stand Sit to Stand: Min assist, +2 physical assistance, Mod assist  Lateral/Scoot Transfers: Min guard General transfer comment: patient refused (family had accidentally gotten patient a bit angry and agitated at EOB, otherwise he seemed receptive to standing/walking)    Ambulation / Gait / Stairs / Wheelchair Mobility  Ambulation/Gait Ambulation/Gait assistance: Mod assist, +2 physical assistance Ambulation Distance (Feet): 5 Feet Assistive device: 2 person hand held assist Gait Pattern/deviations: Step-to pattern, Shuffle, Decreased stride length, Drifts right/left, Trunk flexed General Gait Details: patient refused, agitated  Gait velocity: decreased Gait velocity interpretation: Below normal speed for age/gender    Posture / Balance Dynamic Sitting Balance Sitting balance - Comments: Able to reach forward to adjust sock Balance Overall balance assessment: Needs  assistance Sitting-balance support: Feet supported, Bilateral upper extremity supported Sitting balance-Leahy Scale: Fair Sitting balance - Comments: Able to reach forward to adjust sock Standing balance support: During functional activity, Bilateral upper extremity supported Standing balance-Leahy Scale:  Poor Standing balance comment: reliant on bil UE support    Special needs/care consideration BiPAP/CPAP: No CPM: No Continuous Drip IV: No Dialysis: No         Life Vest: No Oxygen: No Special Bed: No, but rail pads on bed rails  Trach Size: No Wound Vac (area): No       Skin: Bruises to bilateral upper extremities right>left                               Bowel mgmt: No BM since admission  Bladder mgmt: Incontinent with external foley  Diabetic mgmt: No, HgbA1c 4.8     Previous Home Environment Living Arrangements: Spouse/significant other Available Help at Discharge: Family Type of Home: House Home Layout: Two level, Able to live on main level with bedroom/bathroom Home Access: Stairs to enter Technical brewer of Steps: 3 Bathroom Shower/Tub: Multimedia programmer: Standard Home Care Services: No  Discharge Living Setting Plans for Discharge Living Setting: Patient's home, Lives with (comment)(Spouse) Type of Home at Discharge: House Discharge Home Layout: Two level, Able to live on main level with bedroom/bathroom(Spouse sleeps on the main level and patient sleeps on 2nd) Alternate Level Stairs-Rails: Left Alternate Level Stairs-Number of Steps: 14 Discharge Home Access: Stairs to enter Entrance Stairs-Rails: Right Entrance Stairs-Number of Steps: 5 Discharge Bathroom Shower/Tub: Walk-in shower(2nd floor, tub/shower on main ) Discharge Bathroom Toilet: Standard Discharge Bathroom Accessibility: No Does the patient have any problems obtaining your medications?: No  Social/Family/Support Systems Patient Roles: Spouse Contact Information:  Spouse: Costella Hatcher  Anticipated Caregiver: Spouse, friends, and discussed likely need for hired caregivers if spouse wishes to maintain her active lifestyle  Anticipated Caregiver's Contact Information: Cell:[phone] Ability/Limitations of Caregiver: Spouse teaches at [employer] and [employer] enjoys concerts and lectures and working out at the [employer]: Intermittent(but discussed the need to fill time with friends/caregiver ) Discharge Plan Discussed with Primary Caregiver: Yes Is Caregiver In Agreement with Plan?: Yes Does Caregiver/Family have Issues with Lodging/Transportation while Pt is in Rehab?: No  Goals/Additional Needs Patient/Family Goal for Rehab: PT/OT/SLP: Supervision-Min A Expected length of stay: 13-18 days  Cultural Considerations: None Dietary Needs: Full liquid diet  Equipment Needs: TBD Special Service Needs: Patient with sitter at bedside today during my visit Additional Information: May benefit from a telesitter on CIR Pt/Family Agrees to Admission and willing to participate: Yes Program Orientation Provided & Reviewed with Pt/Caregiver Including Roles  & Responsibilities: Yes Additional Information Needs: Spouse requests information on hired caregiver  Information Needs to be Provided By: CSW  Barriers to Discharge: Home environment access/layout(2nd floor bed and bath PTA, but potential with main )  Decrease burden of Care through IP rehab admission: No  Possible need for SNF placement upon discharge: Potentially  Patient Condition: This patient's condition remains as documented in the consult dated 10/27/17 at 4:35pm, in which the Rehabilitation Physician determined and documented that the patient's condition is appropriate for intensive rehabilitative care in an inpatient rehabilitation facility. Will admit to inpatient rehab today.  Preadmission Screen Completed By:  Gunnar Fusi, 10/29/2017 2:18  PM ______________________________________________________________________   Discussed status with Dr. Posey Pronto on 10/29/17 at 1510 and received telephone approval for admission today.  Admission Coordinator:  Gunnar Fusi, time 1510/Date 10/29/17         Revision History

## 2017-10-30 NOTE — Progress Notes (Signed)
Social Work Patient ID: Lianne Bushy, male   DOB: May 28, 1926, 84 y.o.   MRN: [id number]  Assessment completed today with pt and wife (note to follow).  Have spoken with therapies about pt's participation with evaluations.  Pt very resistant to participating and all were ended due to pt's refusal to continue working with them.  I have stressed to pt AND wife that pt must participate with our program or we will need to consider d/c home or to SNF.  Wife states that she understands and attempts to encourage pt to participate.  Pt insists, "[facility].  I'm not doing any more today.  Leave me alone."  Will monitor participation tomorrow, however, additional concerns are that goals are set for min assist (if he participates) and wife does not yet have a plan for how she will provide this.  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  I did provide her with private duty agency information and explained that she will need to consider if they can afford to hire coverage for the time she will be away.  Continue to follow.  [place]lay Mills, LCSW

## 2017-10-30 NOTE — Evaluation (Signed)
Occupational Therapy Assessment and Plan  Patient Details  Name: Ian [name] MRN: [id number] Date of Birth: 01/02/1926  OT Diagnosis: abnormal posture, cognitive deficits and muscle weakness (generalized) Rehab Potential: Rehab Potential (ACUTE ONLY): Poor ELOS: 3-4 weeks   Today's Date: 10/30/2017 OT Individual Time: 2706-2376 OT Individual Time Calculation (min): 27 min  and Today's Date: 10/30/2017 OT Missed Time: 4 Minutes Missed Time Reason: Patient unwilling/refused to participate without medical reason    Problem List:  Patient Active Problem List   Diagnosis Date Noted  . Embolic cerebral infarction ([place]) 10/29/2017  . Dementia without behavioral disturbance   . Benign essential HTN   . Diastolic dysfunction   . Hypothyroidism   . Hypokalemia   . Altered mental status   . Pressure injury of skin 10/26/2017  . Loss of weight 10/26/2017  . Elevated troponin I level 10/26/2017  . Leukocytosis 10/26/2017  . Disheveled appearance 10/26/2017  . Multiple leg contusions, unspecified laterality, sequela 10/26/2017  . Cognitive impairment 10/26/2017  . Scalp hematoma, subsequent encounter 10/26/2017  . Head injury 10/26/2017  . Fall at home, sequela 10/26/2017  . Right BBB/left ant fasc block 10/26/2017  . Acute confusional state 10/26/2017  . Dizziness and giddiness 10/26/2017  . Cerebral infarction [facility]), right occipital lobe 10/26/2017  . Elevated AST (SGOT) 10/26/2017  . Shoulder injury, right, subsequent encounter 10/26/2017  . Paranasal sinus disease 10/26/2017  . Maxillary sinusitis 10/26/2017  . Ethmoid sinusitis 10/26/2017  . Sphenoid sinusitis 10/26/2017  . Rhabdomyolysis 10/25/2017  . Benign hypertension 04/01/2013  . Neuropathy 03/31/2013    Past Medical History:  Past Medical History:  Diagnosis Date  . 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 Calculus of kidney 03/31/2013   Overview:  IMPRESSION: see ER note   . Hypertension   . Leukocytosis  10/26/2017  . Loss of weight 10/26/2017  . Multiple leg contusions, unspecified laterality, sequela 10/26/2017  . Scalp hematoma, subsequent encounter 10/26/2017  . Stroke [facility])    Past Surgical History: History reviewed. No pertinent surgical history.  Assessment & Plan Clinical Impression: Patient is a 84 y.o. year old male with history of hypertension and dementia. Per chart reviewand wife,patient lives with spouse, who works. Reported be independent prior to admission with some reported memory loss in the recent months. 2 level home 3 steps to entry. Presented 10/25/2017 with altered mental status as well as reported fall possible syncopal event. Patient was down for approximately 16 hours before being found. CT/MRI the brainreviewed, showing right occipital infarct. Per report,12 mm focus of reduced diffusion in the right occipital lobe, acute early subacute infarct. No hemorrhage or mass effect. X-ray of hip and shoulder negative. Mildly elevated troponin 0.19 felt to be related to demand ischemia. CT angiogram head and neck showed no large vessel occlusion or significant stenosis. Carotid Dopplers with no evidence of stenosis bilaterally. . BUN 24, creatinine 1.32, CK elevated5905, UDS negative, lactic acid 5.64. Patient placed on IV fluids. Echocardiogram with ejection fraction of 28% grade 1 diastolic dysfunction. EEG negative for seizure. Neurology follow-up placed on aspirin and Plavix 3 weeks then Plavix alone. Plan 30 day monitor on discharge. CK improved 825-377. Full liquid diet and advanced as tolerated. Physical and occupational therapy evaluations completed 10/27/2017 with recommendations of physical medicine rehabilitation consult. Patient transferred to CIR on 10/29/2017 .    Patient currently requires max with basic self-care skills secondary to muscle weakness, decreased cardiorespiratoy endurance, decreased initiation, decreased attention, decreased awareness, decreased problem  solving,  decreased safety awareness, decreased memory and delayed processing and decreased sitting balance, decreased standing balance and decreased balance strategies.  Prior to hospitalization, patient could complete ADLs with modified independent  per chart.  Patient will benefit from skilled intervention to decrease level of assist with basic self-care skills prior to discharge SNF.  Anticipate patient will require minimal physical assistance and SNF vs HHOT.  OT - End of Session Activity Tolerance: Decreased this session Endurance Deficit: Yes OT Assessment Rehab Potential (ACUTE ONLY): Poor OT Barriers to Discharge: Decreased caregiver support;Lack of/limited family support;Behavior OT Patient demonstrates impairments in the following area(s): Balance;Safety;Behavior;Endurance;Motor;Pain OT Basic ADL's Functional Problem(s): Grooming;Bathing;Dressing;Toileting OT Transfers Functional Problem(s): Toilet;Tub/Shower OT Additional Impairment(s): None OT Plan OT Intensity: Minimum of 1-2 x/day, 45 to 90 minutes OT Frequency: 5 out of 7 days OT Duration/Estimated Length of Stay: 3-4 weeks OT Treatment/Interventions: Balance/vestibular training;Discharge planning;Functional electrical stimulation;Pain management;Self Care/advanced ADL retraining;Therapeutic Activities;UE/LE Coordination activities;Cognitive remediation/compensation;Functional mobility training;Patient/family education;Therapeutic Exercise;Community reintegration;DME/adaptive equipment instruction;Neuromuscular re-education;Psychosocial support;UE/LE Strength taining/ROM;Wheelchair propulsion/positioning OT Self Feeding Anticipated Outcome(s): n/a OT Basic Self-Care Anticipated Outcome(s): min A overall OT Toileting Anticipated Outcome(s): min A overall OT Bathroom Transfers Anticipated Outcome(s): min A overall OT Recommendation Recommendations for Other Services: Other (comment)(none at this time) Patient destination:  [place] (SNF) Follow Up Recommendations: Skilled nursing facility Equipment Recommended: To be determined   Skilled Therapeutic Intervention OT educated pt on OT purpose, POC, and goals. Pt refusing all aspects of care this session. Pt yelling at therapist, " I'm not answering any damn questions" and " Get the hell out of my room." Pt refused all OOB activities and declined education as well.   OT Evaluation Precautions/Restrictions  Precautions Precautions: Fall Restrictions Weight Bearing Restrictions: No General OT Amount of Missed Time: 45 Minutes Pain Pain Assessment Pain Assessment: No/denies pain Home Living/Prior Functioning Home Living Family/patient expects to be discharged to:: Unsure Living Arrangements: Spouse/significant other Available Help at Discharge: (no family present at eval) Type of Home: House Home Access: Stairs to enter [employer] of Steps: 3 Entrance Stairs-Rails: (unsure) Home Layout: Two level, Able to live on main level with bedroom/bathroom Bathroom Shower/Tub: Walk-in shower Additional Comments: information taken from PT evaluation as pt refused to answer questions  Lives With: Spouse Prior Function Level of Independence: (per cart pt was mod I PTA) Driving: (pt reports he was driving PTA) Vision Baseline Vision/History: (pt not reporting) Vision Assessment?: Vision impaired- to be further tested in functional context(unsure, will continue to assess with pt participation) Perception   Cognition Overall Cognitive Status: Impaired/Different from baseline Arousal/Alertness: Awake/alert Orientation Level: Person Year: Other (Comment)("i'm not answering your questions") Memory: Impaired Memory Impairment: Decreased recall of new information;Decreased long term memory;Decreased short term memory Decreased Long Term Memory: Verbal basic;Functional basic Decreased Short Term Memory: Verbal basic;Functional  basic Immediate Memory Recall: (refused to answer questions) Attention: Sustained;Selective Sustained Attention: Appears intact Selective Attention: Impaired Selective Attention Impairment: Verbal basic;Functional basic Awareness: Impaired Awareness Impairment: Intellectual impairment Problem Solving: Impaired Problem Solving Impairment: Functional basic;Verbal basic Behaviors: Verbal agitation;Impulsive;Poor frustration tolerance Safety/Judgment: Impaired Glass blower/designer - Skilled Clinical Observations: general weakness Mobility  Bed Mobility Bed Mobility: Supine to Sit Supine to Sit: 4: Min assist;With rails;HOB elevated  Trunk/Postural Assessment  Cervical Assessment Cervical Assessment: Exceptions to WFL(forward head)  Balance Balance Balance Assessed: No Extremity/Trunk Assessment RUE Assessment RUE Assessment: Exceptions to WFL(3-/5 throughout, 90 degrees shoulder flexion) LUE Assessment LUE Assessment: Exceptions to WFL(3-/5 throughout, 90 degrees shoulder flexion)   See  Function Navigator for Current Functional Status.   Refer to Care Plan for Long Term Goals  Recommendations for other services: None    Discharge Criteria: Patient will be discharged from OT if patient refuses treatment 3 consecutive times without medical reason, if treatment goals not met, if there is a change in medical status, if patient makes no progress towards goals or if patient is discharged from hospital.  The above assessment, treatment plan, treatment alternatives and goals were discussed and mutually agreed upon: No family available/patient unable  Gypsy Decant 10/30/2017, 2:38 PM

## 2017-10-30 NOTE — Progress Notes (Signed)
Bilateral lower extremity venous duplex completed. No evidence of DVT, superficial thrombosis, or Baker's cyst. Toma Copier, RVS 10/30/2017 4:40 PM

## 2017-10-30 NOTE — Evaluation (Signed)
Physical Therapy Assessment and Plan  Patient Details  Name: Ian [name] MRN: [id number] Date of Birth: 05/28/1926  PT Diagnosis: Abnormality of gait, Coordination disorder, Difficulty walking, Impaired cognition and Muscle weakness Rehab Potential: Fair ELOS: 3-4 weeks   Today's Date: 10/30/2017 PT Individual Time: 0263-7858 PT Individual Time Calculation (min): 45 min    Problem List:  Patient Active Problem List   Diagnosis Date Noted  . Embolic cerebral infarction ([place]) 10/29/2017  . Dementia without behavioral disturbance   . Benign essential HTN   . Diastolic dysfunction   . Hypothyroidism   . Hypokalemia   . Altered mental status   . Pressure injury of skin 10/26/2017  . Loss of weight 10/26/2017  . Elevated troponin I level 10/26/2017  . Leukocytosis 10/26/2017  . Disheveled appearance 10/26/2017  . Multiple leg contusions, unspecified laterality, sequela 10/26/2017  . Cognitive impairment 10/26/2017  . Scalp hematoma, subsequent encounter 10/26/2017  . Head injury 10/26/2017  . Fall at home, sequela 10/26/2017  . Right BBB/left ant fasc block 10/26/2017  . Acute confusional state 10/26/2017  . Dizziness and giddiness 10/26/2017  . Cerebral infarction [facility]), right occipital lobe 10/26/2017  . Elevated AST (SGOT) 10/26/2017  . Shoulder injury, right, subsequent encounter 10/26/2017  . Paranasal sinus disease 10/26/2017  . Maxillary sinusitis 10/26/2017  . Ethmoid sinusitis 10/26/2017  . Sphenoid sinusitis 10/26/2017  . Rhabdomyolysis 10/25/2017  . Benign hypertension 04/01/2013  . Neuropathy 03/31/2013    Past Medical History:  Past Medical History:  Diagnosis Date  . 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 Calculus of kidney 03/31/2013   Overview:  IMPRESSION: see ER note   . Hypertension   . Leukocytosis 10/26/2017  . Loss of weight 10/26/2017  . Multiple leg contusions, unspecified laterality, sequela 10/26/2017  . Scalp hematoma, subsequent  encounter 10/26/2017  . Stroke [facility])    Past Surgical History: History reviewed. No pertinent surgical history.  Assessment & Plan Clinical Impression: Patient is a 84 y.o. year old male with history of hypertension and dementia. Per chart reviewand wife,patient lives with spouse, who works. Reported be independent prior to admission with some reported memory loss in the recent months. 2 level home 3 steps to entry. Presented 10/25/2017 with altered mental status as well as reported fall possible syncopal event. Patient was down for approximately 16 hours before being found. CT/MRI the brainreviewed, showing right occipital infarct. Per report,12 mm focus of reduced diffusion in the right occipital lobe, acute early subacute infarct. No hemorrhage or mass effect. X-ray of hip and shoulder negative. Mildly elevated troponin 0.19 felt to be related to demand ischemia. CT angiogram head and neck showed no large vessel occlusion or significant stenosis. Carotid Dopplers with no evidence of stenosis bilaterally. . BUN 24, creatinine 1.32, CK elevated5905, UDS negative, lactic acid 5.64. Patient placed on IV fluids. Echocardiogram with ejection fraction of 85% grade 1 diastolic dysfunction. EEG negative for seizure. Neurology follow-up placed on aspirin and Plavix 3 weeks then Plavix alone. Plan 30 day monitor on discharge. CK improved 825-377.   Full liquid diet and advanced as tolerated. Physical and occupational therapy evaluations completed 10/27/2017 with recommendations of physical medicine rehabilitation consult. Patient was admitted for a comprehensive rehabilitation program.  Patient transferred to CIR on 10/29/2017 .   Patient currently requires max with mobility secondary to muscle weakness, decreased cardiorespiratoy endurance, decreased coordination, decreased initiation, decreased attention, decreased awareness, decreased problem solving, decreased safety awareness, decreased memory and delayed  processing, and decreased  standing balance, decreased postural control and decreased balance strategies.  Prior to hospitalization, patient was independent  with mobility and lived with Spouse in a House home.  Home access is 3Stairs to enter.  Patient will benefit from skilled PT intervention to maximize safe functional mobility, minimize fall risk and decrease caregiver burden for planned discharge home with 24 hr assist vs SNF.  Anticipate patient will HHPT vs SNF at discharge.  PT - End of Session Activity Tolerance: (pt mainly limited by irritation and refusal to participate) PT Assessment Rehab Potential (ACUTE/IP ONLY): Fair PT Barriers to Discharge: Decreased caregiver support;Behavior;Home environment access/layout PT Barriers to Discharge Comments: unsure of assistance wife can provide, unsure if pt has rails to hold to negotiate stairs, pt confused PT Patient demonstrates impairments in the following area(s): Balance;Safety;Behavior;Sensory;Skin Integrity;Endurance;Motor;Perception PT Transfers Functional Problem(s): Bed Mobility;Bed to Chair;Car;Furniture PT Locomotion Functional Problem(s): Stairs;Wheelchair Mobility;Ambulation PT Plan PT Intensity: Minimum of 1-2 x/day ,45 to 90 minutes PT Frequency: 5 out of 7 days PT Duration Estimated Length of Stay: 3-4 weeks PT Treatment/Interventions: Ambulation/gait training;Community reintegration;DME/adaptive equipment instruction;Neuromuscular re-education;Psychosocial support;Stair training;UE/LE Strength taining/ROM;Wheelchair propulsion/positioning;UE/LE Coordination activities;Therapeutic Activities;Skin care/wound management;Pain management;Discharge planning;Balance/vestibular training;Cognitive remediation/compensation;Disease management/prevention;Functional mobility training;Patient/family education;Therapeutic Exercise;Visual/perceptual remediation/compensation PT Transfers Anticipated Outcome(s): min assist with LRAD PT  Locomotion Anticipated Outcome(s): min assist with LRAD PT Recommendation Recommendations for Other Services: Speech consult Follow Up Recommendations: 24 hour supervision/assistance Patient destination: (home with 24 hr assist vs SNF) Equipment Recommended: To be determined  Skilled Therapeutic Intervention Pt received in bed and easily irritated that therapist woke him from his sleep. Therapist obtained cushion for w/c for increased comfort when OOB. Pt required max encouragement and extra time to transfer out of bed and completed stand pivot bed>w/c with max assist. Pt with poor awareness of body and need to pivot, thus requiring max assist and cuing. Transported pt around unit in w/c to increase upright tolerance and engaging in conversation to build rapport with pt. Attempted to have pt ambulate in dayroom but pt declined. Pt engaged in ball toss x 2 tosses demonstrating impaired coordination of BUE before becoming irritated with task. Pt able to recall he is in the hospital but reports he is going home today and incorrectly reports that he is 84 y/o. Pt easily irritated with therapist asking him questions in conversation, reporting he's not answering any more questions. Pt declined washing his face or brushing his hair. Pt missed 15 minutes of skilled PT treatment 2/2 unwillingness to participate. Pt left sitting in w/c with QRB donned and at nurses station.   PT Evaluation Precautions/Restrictions Precautions Precautions: Fall Restrictions Weight Bearing Restrictions: No  General Chart Reviewed: Yes Additional Pertinent History: baseline dementia?, HTN, benign parathyroid neoplasm PT Amount of Missed Time (min): 15 Minutes PT Missed Treatment Reason: Patient unwilling to participate Response to Previous Treatment: Not applicable Family/Caregiver Present: No  Pain No behaviors demonstrating or c/o pain.   Home Living/Prior Functioning Home Living Available Help at Discharge:  (unsure, no family present at eval) Type of Home: House Home Access: Stairs to enter [employer] of Steps: 3 Entrance Stairs-Rails: (unsure) Home Layout: Two level;Able to live on main level with bedroom/bathroom  Lives With: Spouse Prior Function Level of Independence: (per chart pt was independent PTA) Driving: (pt reports he was driving PTA)  Vision/Perception  Unable to assess 2/2 cognitive deficits.   Cognition Overall Cognitive Status: Impaired/Different from baseline Orientation Level: Oriented to person;Oriented to place(pt able to report he was in the hospital) Memory:  Impaired Memory Impairment: Decreased recall of new information;Decreased long term memory;Decreased short term memory Awareness: Impaired Awareness Impairment: Intellectual impairment Problem Solving: Impaired Problem Solving Impairment: Functional basic;Verbal basic Behaviors: Verbal agitation;Impulsive Safety/Judgment: Impaired  Glass blower/designer - Skilled Clinical Observations: general weakness   Mobility Bed Mobility Bed Mobility: Supine to Sit Supine to Sit: 4: Min assist;With rails;HOB elevated(assist to transfer LE off of bed) Transfers Transfers: Yes Stand Pivot Transfers: 2: Max assist Stand Pivot Transfer Details: Tactile cues for weight shifting;Tactile cues for posture;Tactile cues for placement;Verbal cues for sequencing;Verbal cues for technique;Manual facilitation for placement;Manual facilitation for weight shifting  Locomotion  Ambulation Ambulation: No Gait Gait: No Stairs / Additional Locomotion Stairs: No Wheelchair Mobility Wheelchair Mobility: No   Trunk/Postural Assessment  Cervical Assessment Cervical Assessment: (forward head) Postural Control Postural Control: Deficits on evaluation Righting Reactions: impaired    See Function Navigator for Current Functional Status.   Refer to Care Plan for Long Term Goals  Recommendations for other services:  Other: SLP  Discharge Criteria: Patient will be discharged from PT if patient refuses treatment 3 consecutive times without medical reason, if treatment goals not met, if there is a change in medical status, if patient makes no progress towards goals or if patient is discharged from hospital.  The above assessment, treatment plan, treatment alternatives and goals were discussed and mutually agreed upon: No family available/patient unable  [place] 10/30/2017, 9:22 AM

## 2017-10-30 NOTE — Progress Notes (Signed)
[facility]IC[facility]ILIT[facility]     PROGRESS NOTE    Subjective/Complaints: Says that he had a reasonable night.  No problems reported per nursing.  Unhappy with his meal tray however this morning.  ROS: pt denies nausea, vomiting, diarrhea, cough, shortness of breath or chest pain   Objective: Vital Signs: Blood pressure 137/75, pulse 88, temperature 98.2 F (36.8 C), temperature source Oral, resp. rate 18, weight 77.5 kg (170 lb 13.7 oz), SpO2 98 %. Ct Head Wo Contrast  Result Date: 10/29/2017 CLINICAL DATA:  Follow-up exam for subarachnoid hemorrhage. EXAM: CT HEAD WITHOUT CONTRAST TECHNIQUE: Contiguous axial images were obtained from the base of the skull through the vertex without intravenous contrast. COMPARISON:  Prior CT from 10/27/2017. FINDINGS: Brain: Previously noted trace bifrontal subarachnoid hemorrhage and possible small subdural hemorrhage no longer clearly delineated, likely resolved in the interim. No new intracranial hemorrhage identified. No acute large vessel territory infarct. No mass lesion, midline shift or mass effect. No hydrocephalus. No appreciable extra-axial fluid collection. Atrophy with chronic small vessel ischemic disease again noted. Vascular: No hyperdense vessel. Calcified atherosclerosis noted at the skull base. Skull: Previously seen scalp contusion has essentially resolved. Calvarium intact and unchanged. Sinuses/Orbits: Globes and orbital soft tissues within normal limits. Left-sided paranasal sinus disease again noted, stable. No mastoid effusion. Other: None. IMPRESSION: 1. Interval resolution of previously seen minimal bifrontal subarachnoid and subdural hemorrhage. No intracranial hemorrhage now identified. 2. No other new acute intracranial process. 3. Stable atrophy with advanced chronic microvascular ischemic disease. Electronically Signed   By: Jeannine Boga M.D.   On: 10/29/2017 04:14   Recent Labs    10/29/17 0611  10/30/17 0631  WBC 9.8 9.9  HGB 11.9* 12.6*  HCT 34.5* 36.5*  PLT 180 211   Recent Labs    10/29/17 0611 10/30/17 0631  NA 135 136  K 3.5 4.0  CL 105 103  GLUCOSE 89 99  BUN 16 15  CREATININE 0.75 0.83  CALCIUM 10.0 10.3   CBG (last 3)  No results for input(s): GLUCAP in the last 72 hours.  Wt Readings from Last 3 Encounters:  10/30/17 77.5 kg (170 lb 13.7 oz)  10/29/17 78.2 kg (172 lb 6.4 oz)  02/01/16 79.8 kg (176 lb)    Physical Exam:  Constitutional: He appearswell-developedand well-nourished telesitter HENT:  Head:Normocephalic.   .  Eyes:EOMI Neck:Normal range of motion.Neck supple.No thyromegalypresent.  Cardiovascular:RRR without murmur. No JVD  Respiratory: CTA Bilaterally without wheezes or rales. Normal effort  QI:WLNL.Bowel sounds are normal. He exhibitsno distension/nontender Skin.Warm and dry with scattered hematomas Musculoskeletal: No edema or tenderness in extremities Neurological: He isalert. HOH Followed simple commands.  A&Ox3 with cues.  Follows simple commands but impaired insight and awareness.  Language appears to be generally functional. Motor: LUE: 5 out of 5 proximal distal LLE: HF, 4- to 4 out of 5 proximal distal RUE: 3+-4-/5 proximal to distal RLE: HF, KE 2-3/5, ADF 0/5/PF--patient with motor apraxia Sensation intact to light touchin all fours Psych: Patient slightly irritable this morning    Assessment/Plan: 1. Functional deficits and right hemiparesis secondary to right occipital infarct/rhabdomyolysis which require 3+ hours per day of interdisciplinary therapy in a comprehensive inpatient rehab setting. Physiatrist is providing close team supervision and 24 hour management of active medical problems listed below. Physiatrist and rehab team continue to assess barriers to discharge/monitor patient progress toward functional and medical goals.  Function:  Bathing Bathing position      Bathing parts  Bathing assist        Upper Body Dressing/Undressing Upper body dressing                    Upper body assist        Lower Body Dressing/Undressing Lower body dressing                                  Lower body assist        Toileting Toileting Toileting activity did not occur: No continent bowel/bladder event   Toileting steps completed by helper: Adjust clothing prior to toileting, Performs perineal hygiene, Adjust clothing after toileting    Toileting assist     Transfers Chair/bed transfer   Chair/bed transfer method: Stand pivot Chair/bed transfer assist level: Maximal assist (Pt 25 - 49%/lift and lower)       Locomotion Ambulation Ambulation activity did not occur: Safety/medical concerns         Wheelchair Wheelchair activity did not occur: Safety/medical concerns        Cognition Comprehension Comprehension assist level: Understands basic less than 25% of the time/ requires cueing >75% of the time  Expression Expression assist level: Expresses basic 25 - 49% of the time/requires cueing 50 - 75% of the time. Uses single words/gestures.  Social Interaction Social Interaction assist level: Interacts appropriately less than 25% of the time. May be withdrawn or combative.  Problem Solving Problem solving assist level: Solves basic less than 25% of the time - needs direction nearly all the time or does not effectively solve problems and may need a restraint for safety  Memory Memory assist level: Recognizes or recalls less than 25% of the time/requires cueing greater than 75% of the time   Medical Problem List and Plan: 1.  Decreased functional mobility, possible syncopal event with fall secondary to right occipital embolic infarction as well as rhabdomyolysis.    -Aspirin and Plavix 3 weeks then Plavix alone. Plan for 30 day monitor on discharge     -Beginning therapies today   -Tele-sitter for safety 2.  DVT Prophylaxis/Anticoagulation:  SCDs. Monitor for any signs of DVT   -We will check Dopplers 3. Pain Management: Tylenol as needed 4. Mood/dementia. Discuss baseline with wife.  5. Neuropsych: This patient is capable of making decisions on his own behalf. 6. Skin/Wound Care: Routine skin checks 7. Fluids/Electrolytes/Nutrition: Reviewed the importance of regular nutritional intake with patient.  Labs reviewed this morning and other than his low albumin are reasonable.   Team to encourage= p.o. Intake   -dietician follow up 8. Hypertension. Norvasc 5 mg daily.  Blood pressure controlled at present 9. Hypothyroidism. Synthroid. TSH 9.181. Free T4 0.77. Continue current supplement for now 10. Dysphagia. Follow-up speech therapy. Advance diet as tolerated. 13. Diastolic dysfunction: Monitor for signs/symptoms of fluid overload   -follow weights    Filed Weights   10/30/17 0401  Weight: 77.5 kg (170 lb 13.7 oz)      LOS (Days) 1 A FACE TO FACE EVALUATION WAS PERFORMED  Meredith Staggers, MD 10/30/2017 9:25 AM

## 2017-10-30 NOTE — Progress Notes (Signed)
Patient is more agitated this evening. Told several nurses and nurse tech that he would punch them if we tried to get back in the bed. We called wife. The wife talk to the patient on the phone. Has calm down a little but still agitated.

## 2017-10-31 ENCOUNTER — Inpatient Hospital Stay (HOSPITAL_COMMUNITY): Payer: Medicare Other | Admitting: Physical Therapy

## 2017-10-31 ENCOUNTER — Inpatient Hospital Stay (HOSPITAL_COMMUNITY): Payer: Medicare Other | Admitting: Occupational Therapy

## 2017-10-31 ENCOUNTER — Inpatient Hospital Stay (HOSPITAL_COMMUNITY): Payer: Medicare Other | Admitting: Speech Pathology

## 2017-10-31 NOTE — IPOC Note (Signed)
Overall Plan of Care [facility]) Patient Details Name: Cortney Mckinney MRN: [id number] DOB: 1926-04-17  Admitting Diagnosis: <principal problem not specified>stroke  Hospital Problems: Active Problems:   Embolic cerebral infarction [facility])     Functional Problem List: Nursing Bladder, Bowel, Edema, Endurance, Behavior, Skin Integrity, Medication Management, Motor, Safety, Nutrition, Perception  PT Balance, Safety, Behavior, Sensory, Skin Integrity, Endurance, Motor, Perception  OT Balance, Safety, Behavior, Endurance, Motor, Pain  SLP Cognition  TR         Basic ADL's: OT Grooming, Bathing, Dressing, Toileting     Advanced  ADL's: OT       Transfers: PT Bed Mobility, Bed to Chair, Car, Manufacturing systems engineer, Metallurgist: PT Stairs, Emergency planning/management officer, Ambulation     Additional Impairments: OT None  SLP Swallowing, Social Cognition   Social Interaction, Problem Solving, Attention, Awareness  TR      Anticipated Outcomes Item Anticipated Outcome  Self Feeding n/a  Swallowing  Supervision    Basic self-care  min A overall  Toileting  min A overall   Bathroom Transfers min A overall  Bowel/Bladder  Mod I assist  Transfers  min assist with LRAD  Locomotion  min assist with LRAD  Communication     Cognition  Min A  Pain  < 3  Safety/Judgment  Supervision   Therapy Plan: PT Intensity: Minimum of 1-2 x/day ,45 to 90 minutes PT Frequency: 5 out of 7 days PT Duration Estimated Length of Stay: 3-4 weeks OT Intensity: Minimum of 1-2 x/day, 45 to 90 minutes OT Frequency: 5 out of 7 days OT Duration/Estimated Length of Stay: 3-4 weeks SLP Intensity: Minumum of 1-2 x/day, 30 to 90 minutes SLP Frequency: 3 to 5 out of 7 days SLP Duration/Estimated Length of Stay: 3-4 weeks    Team Interventions: Nursing Interventions Patient/Family Education, Bladder Management, Bowel Management, Disease Management/Prevention, Cognitive Remediation/Compensation, Skin  Care/Wound Management, Medication Management, Discharge Planning, Psychosocial Support  PT interventions Ambulation/gait training, Community reintegration, DME/adaptive equipment instruction, Neuromuscular re-education, Psychosocial support, Stair training, UE/LE Strength taining/ROM, Wheelchair propulsion/positioning, UE/LE Coordination activities, Therapeutic Activities, Skin care/wound management, Pain management, Discharge planning, Training and development officer, Cognitive remediation/compensation, Disease management/prevention, Functional mobility training, Patient/family education, Therapeutic Exercise, Visual/perceptual remediation/compensation  OT Interventions Balance/vestibular training, Discharge planning, Functional electrical stimulation, Pain management, Self Care/advanced ADL retraining, Therapeutic Activities, UE/LE Coordination activities, Cognitive remediation/compensation, Functional mobility training, Patient/family education, Therapeutic Exercise, Community reintegration, Engineer, drilling, Neuromuscular re-education, Psychosocial support, UE/LE Strength taining/ROM, Wheelchair propulsion/positioning  SLP Interventions Cognitive remediation/compensation, Environmental controls, Internal/external aids, Therapeutic Activities, Patient/family education, Functional tasks, Dysphagia/aspiration precaution training, Cueing hierarchy  TR Interventions    SW/CM Interventions Discharge Planning, Patient/Family Education, Psychosocial Support   Barriers to Discharge MD  Medical stability and Behavior  Nursing Behavior, Decreased caregiver support    PT Decreased caregiver support, Behavior, Home environment access/layout unsure of assistance wife can provide, unsure if pt has rails to hold to negotiate stairs, pt confused  OT Decreased caregiver support, Lack of/limited family support, Behavior    SLP Behavior    SW       Team Discharge Planning: Destination: PT-(home  with 24 hr assist vs SNF) ,OT- [place] (SNF) , SLP-Home Projected Follow-up: PT-24 hour supervision/assistance, OT-  Skilled nursing facility, SLP-24 hour supervision/assistance, Home Health SLP Projected Equipment Needs: PT-To be determined, OT- To be determined, SLP-None recommended by SLP Equipment Details: PT- , OT-  Patient/family involved in discharge planning: PT- Patient unable/family or caregiver not  available,  OT-Patient unable/family or caregiver not available, SLP-Patient, Family member/caregiver  MD ELOS: 3-4 weeks Medical Rehab Prognosis:  Good/fair Assessment: The patient has been admitted for CIR therapies with the diagnosis of embolic CVA. The team will be addressing functional mobility, strength, stamina, balance, safety, adaptive techniques and equipment, self-care, bowel and bladder mgt, patient and caregiver education, NMR, cognitive perceptual rx, behavior, ego support. Goals have been set at min assist for self-care, ADL's, locomotion, transfers, cognition. We are struggling with behavioral issues currently which has limited participation at times in therapy.    Meredith Staggers, MD, FAAPMR      See Team Conference Notes for weekly updates to the plan of care

## 2017-10-31 NOTE — Progress Notes (Signed)
Occupational Therapy Session Note  Patient Details  Name: Ian [name] MRN: [id number] Date of Birth: 1926-03-12  Today's Date: 10/31/2017 Missed 60 minutes   Short Term Goals: Week 1:  OT Short Term Goal 1 (Week 1): Pt will perform toilet transfer with max A in order to decrease level of assistance for functional transfer. OT Short Term Goal 2 (Week 1): Pt will perform UB dressing with mod A. OT Short Term Goal 3 (Week 1): Pt will perform LB dressing with max A.   Skilled Therapeutic Interventions/Progress Updates:    Pt greeted supine in bed, asleep, agitated that therapist had woken him up. Pt yelling "go away." Attempted to calm and redirect pt, however pt rolling over in bed (to face away from therapist). "If you don't leave me alone, I'll do something destructive!"  Pt growing increasingly agitated. Left him in bed to deescalate. 4 bedrails up and bed alarm set at session exit. 60 minutes missed due to heightened agitation/unwillingness to participate.  Therapy Documentation Precautions:  Precautions Precautions: Fall Restrictions Weight Bearing Restrictions: No General: General OT Amount of Missed Time: 60 Minutes Vital Signs: Therapy Vitals Temp: 97.8 F (36.6 C) Temp Source: Oral Pulse Rate: 86 Resp: 18 BP: 119/74 Patient Position (if appropriate): Lying Oxygen Therapy SpO2: 97 % O2 Device: Room Air ADL:      See Function Navigator for Current Functional Status.   Therapy/Group: Individual Therapy  Amor Packard A Garnett Rekowski 10/31/2017, 3:58 PM

## 2017-10-31 NOTE — Progress Notes (Signed)
Speech Language Pathology Daily Session Note  Patient Details  Name: Ian [name] MRN: [id number] Date of Birth: [birth year]  Today's Date: 10/31/2017 SLP Individual Time: 1100-1130 SLP Individual Time Calculation (min): 30 min  Short Term Goals: Week 1: SLP Short Term Goal 1 (Week 1): Patient will consume current diet with minimal overt s/s of aspiration with Mod A verbal cues for use of small bites/sips and a slow rate of self-feeding.  SLP Short Term Goal 2 (Week 1): Patient will recall new, daily information with Mod A multimodal cues.  SLP Short Term Goal 3 (Week 1): Patient will demonstrate functional problem solving for basic and familiar tasks with Mod A multimodal cues.  SLP Short Term Goal 4 (Week 1): Patient will identify 2 cognitive deficits with Max A multimodal cues.  SLP Short Term Goal 5 (Week 1): Patient will demonstrate selective attention in a mildly distracting enviornment for 30 minutes with Mod A verbal cues for redirection.   Skilled Therapeutic Interventions: Skilled treatment session focused on cognitive goals. SLP facilitated session by administering the MoCA (version 7.3). Patient scored 15/30 points with a score of 26 or above considered normal. Patient demonstrates deficits in executive functioning, visuospatial, attention, abstract reasoning and short-term recall. Patient educated on results and verbalized understanding. Patient left upright in wheelchair at RN station with quick release belt in place. Continue with current plan of care.      Function:   Cognition Comprehension Comprehension assist level: Understands basic 50 - 74% of the time/ requires cueing 25 - 49% of the time  Expression   Expression assist level: Expresses basic 50 - 74% of the time/requires cueing 25 - 49% of the time. Needs to repeat parts of sentences.  Social Interaction Social Interaction assist level: Interacts appropriately 50 - 74% of the time - May be physically or verbally  inappropriate.  Problem Solving Problem solving assist level: Solves basic 25 - 49% of the time - needs direction more than half the time to initiate, plan or complete simple activities  Memory Memory assist level: Recognizes or recalls less than 25% of the time/requires cueing greater than 75% of the time    Pain No/Denies Pain   Therapy/Group: Individual Therapy  Kjerstin Abrigo 10/31/2017, 12:43 PM

## 2017-10-31 NOTE — Progress Notes (Signed)
Physical Therapy Session Note  Patient Details  Name: Ian [name] MRN: [id number] Date of Birth: [birth year]  Today's Date: 10/31/2017 PT Individual Time: 0626-9485 and 1136-1200 PT Individual Time Calculation (min): 39 min and 24 min   Short Term Goals: Week 1:  PT Short Term Goal 1 (Week 1): Pt will complete bed<>w/c with mod assist +1. PT Short Term Goal 2 (Week 1): Pt will ambulate 25 ft with max assist +1. PT Short Term Goal 3 (Week 1): Pt will initiate stair training.   Skilled Therapeutic Interventions/Progress Updates:  Treatment 1: Pt received in bed & reluctantly agreeable to tx. No c/o pain reported. Pt reports need to use restroom and requires min assist for supine>sitting as pt unable to transfer trunk upright. Pt completes stand pivot bed>w/c with max assist and max facilitation to complete pivot and safely sit in w/c seat. Transported pt into bathroom via w/c total assist and pt completes stand pivot w/c<>toilet with mod assist & use of grab bar. Pt sitting on toilet before lowering brief requiring max cuing to correct and assistance to pull brief over hips. Pt unable to have continent void or BM on toilet but had previously soiled brief. Pt requires total assist and cuing to don clean brief. When attempting to transfer back to w/c pt experienced R knee buckling and required max assist to prevent LOB and safely sit in chair. Pt refusing to wash hands when set up at sink but uses gel. Pt reports need for shoes to ambulate and dons left tennis shoe with supervision but requires assistance to don R shoe. Gait x 5 ft with HHA +2 with pt demonstrating forward flexed posture, impaired weight shifting L<>R, and inability to advance RLE. Transitioned to rail in hallway and pt ambulated 30 ft with max assist + w/c follow for safety; pt continues to demonstrate same impairments as noted above and unable to correct despite multimodal cuing but no RLE buckling noted, therapist provides manual  facilitation for advancement of RLE. At end of session pt left sitting in w/c at nurses station with [place] donned. Pt appears to be in more pleasant mood, joking & conversing with therapist & nurses.   Treatment 2: Pt received in bed with wife present. No c/o pain reported. Pt's wife Ian [name]) providing PLOF information, noting pt has been forgetting family members names and other various every day information for ~3 years now. Educated Ian [name] on pt's performance during today's therapy, discussed ELOS being dependent upon pt's progress & willingness to participate, and recommendation for 24 hr supervision/assistance upon d/c. Ian [name] voiced understanding. Transported pt out into hallway and attempted to ambulate with rail; pt completes sit>stand with min assist and becomes easily irritated with therapist providing assistance for safety & returns to sitting in w/c. Pt left in w/c with QRB donned & wife present in room.   Therapy Documentation Precautions:  Precautions Precautions: Fall Restrictions Weight Bearing Restrictions: No   See Function Navigator for Current Functional Status.   Therapy/Group: Individual Therapy  Waunita Schooner 10/31/2017, 12:35 PM

## 2017-10-31 NOTE — Progress Notes (Signed)
[facility] PH[facility] MEDICINE & REHABILITATION     PROGRESS NOTE    Subjective/Complaints: Patient in bed.  States that "I am ready to go home, I can do better there."  Irritable with therapies also.  ROS: Limited due to cognitive/behavioral   Objective: Vital Signs: Blood pressure 136/79, pulse 90, temperature 98.1 F (36.7 C), temperature source Oral, resp. rate 18, weight 77.3 kg (170 lb 4.9 oz), SpO2 98 %. No results found. Recent Labs    10/29/17 0611 10/30/17 0631  WBC 9.8 9.9  HGB 11.9* 12.6*  HCT 34.5* 36.5*  PLT 180 211   Recent Labs    10/29/17 0611 10/30/17 0631  NA 135 136  K 3.5 4.0  CL 105 103  GLUCOSE 89 99  BUN 16 15  CREATININE 0.75 0.83  CALCIUM 10.0 10.3   CBG (last 3)  No results for input(s): GLUCAP in the last 72 hours.  Wt Readings from Last 3 Encounters:  10/31/17 77.3 kg (170 lb 4.9 oz)  10/29/17 78.2 kg (172 lb 6.4 oz)  02/01/16 79.8 kg (176 lb)    Physical Exam:  Constitutional: He appearswell-developedand well-nourished telesitter in place HENT:  Head:Normocephalic.   .  Eyes:EOMI Neck:Normal range of motion.Neck supple.No thyromegalypresent.  Cardiovascular:RRR without murmur. No JVD  Respiratory:CTA Bilaterally without wheezes or rales. Normal effort  PY:KDXI.Bowel sounds are normal. He exhibitsno distension/nontender Skin.Warm and dry with scattered hematomas Musculoskeletal: No edema or tenderness in extremities Neurological: He isalert. HOH Followed simple commands.  A&Ox3 with cues.  Follows simple commands but impaired insight and awareness.  Language appears to be generally functional. Motor: LUE: 5 out of 5 proximal distal LLE: HF, 4- to 4 out of 5 proximal distal RUE: 3+-4-/5 proximal to distal RLE: HF, KE 2-3/5, ADF 0/5/PF--ongoing motor planning issues. Sensation intact to light touchin all fours Psych: Irritable    Assessment/Plan: 1. Functional deficits and right hemiparesis secondary  to right occipital infarct/rhabdomyolysis which require 3+ hours per day of interdisciplinary therapy in a comprehensive inpatient rehab setting. Physiatrist is providing close team supervision and 24 hour management of active medical problems listed below. Physiatrist and rehab team continue to assess barriers to discharge/monitor patient progress toward functional and medical goals.  Function:  Bathing Bathing position Bathing activity did not occur: Refused    Bathing parts      Bathing assist        Upper Body Dressing/Undressing Upper body dressing Upper body dressing/undressing activity did not occur: Refused                  Upper body assist        Lower Body Dressing/Undressing Lower body dressing Lower body dressing/undressing activity did not occur: Refused                                Lower body assist        Toileting Toileting Toileting activity did not occur: No continent bowel/bladder event   Toileting steps completed by helper: Adjust clothing prior to toileting, Performs perineal hygiene, Adjust clothing after toileting    Toileting assist     Transfers Chair/bed transfer Chair/bed transfer activity did not occur: Refused Chair/bed transfer method: Stand pivot Chair/bed transfer assist level: Maximal assist (Pt 25 - 49%/lift and lower)       Locomotion Ambulation Ambulation activity did not occur: Safety/medical Editor, commissioning  activity did not occur: Safety/medical concerns        Cognition Comprehension Comprehension assist level: Understands basic 50 - 74% of the time/ requires cueing 25 - 49% of the time  Expression Expression assist level: Expresses basic 50 - 74% of the time/requires cueing 25 - 49% of the time. Needs to repeat parts of sentences.  Social Interaction Social Interaction assist level: Interacts appropriately 50 - 74% of the time - May be physically or verbally inappropriate.   Problem Solving Problem solving assist level: Solves basic 25 - 49% of the time - needs direction more than half the time to initiate, plan or complete simple activities  Memory Memory assist level: Recognizes or recalls less than 25% of the time/requires cueing greater than 75% of the time   Medical Problem List and Plan: 1.  Decreased functional mobility, possible syncopal event with fall secondary to right occipital embolic infarction as well as rhabdomyolysis.    -Aspirin and Plavix 3 weeks then Plavix alone. Plan for 30 day monitor on discharge     -Continue therapies.  May need to consider other rehab options given patient's unwillingness to consistently participate with therapy.   -Tele-sitter for safety 2.  DVT Prophylaxis/Anticoagulation: SCDs. Monitor for any signs of DVT   -Dopplers negative 3. Pain Management: Tylenol as needed 4. Mood/dementia. Discuss baseline with wife.  5. Neuropsych: This patient is capable of making decisions on his own behalf. 6. Skin/Wound Care: Routine skin checks 7. Fluids/Electrolytes/Nutrition: Reviewed the importance of regular nutritional intake with patient.  Labs reviewed this morning and other than his low albumin labs are reasonable.   Team to encourage p.o. Intake   -dietician follow up 8. Hypertension. Norvasc 5 mg daily.  Blood pressure controlled at present 9. Hypothyroidism. Synthroid. TSH 9.181. Free T4 0.77. Continue current supplement for now 10. Dysphagia. Follow-up speech therapy. Advance diet as tolerated. 13. Diastolic dysfunction: Monitor for signs/symptoms of fluid overload   -follow weights as below    [employer]   10/30/17 0401 10/31/17 0100  Weight: 77.5 kg (170 lb 13.7 oz) 77.3 kg (170 lb 4.9 oz)      LOS (Days) 2 A FACE TO FACE EVALUATION WAS PERFORMED  Alger Simons T, MD 10/31/2017 10:00 AM

## 2017-10-31 NOTE — Progress Notes (Signed)
Occupational Therapy Session Note  Patient Details  Name: Ian [name] MRN: [id number] Date of Birth: 12-Nov-1925  Today's Date: 10/31/2017 OT Individual Time: 3748-2707 OT Individual Time Calculation (min): 10 min  OT Missed time: 35 minutes (pt refusal without medical reason)   Short Term Goals: Week 1:  OT Short Term Goal 1 (Week 1): Pt will perform toilet transfer with max A in order to decrease level of assistance for functional transfer. OT Short Term Goal 2 (Week 1): Pt will perform UB dressing with mod A. OT Short Term Goal 3 (Week 1): Pt will perform LB dressing with max A.   Skilled Therapeutic Interventions/Progress Updates:    Pt seen for OT session focusing on pt participation, education, and bed mobility. Pt asleep in supine upon arrival, easily awoken, however, very agitated when awoken and refusing all tx options. Encouragement and education provided regarding role of OT, importance of participation with activiy and mobility and d/c planning. Pt reports once he returns home he will sit in recliner and therefore does not need therapy services. Offered to assist pt to recliner in room, however, pt refusing. Upon therapist exiting room, pt requested an extra pillow. With set-up and min A, pt able to don pillowcase using B UEs to complete task. Mod A for anterior weight shift from supine to come into long sitting for pillow to be placed. Pt cont to refuse any mobility following this activity. Pt left in supine with all needs in reach, QRB donned and telesitter activated.   Therapy Documentation Precautions:  Precautions Precautions: Fall Restrictions Weight Bearing Restrictions: No Pain:    No/denies pain  See Function Navigator for Current Functional Status.   Therapy/Group: Individual Therapy  Sussie Minor L 10/31/2017, 6:52 AM

## 2017-10-31 NOTE — Plan of Care (Signed)
  Progressing RH BOWEL ELIMINATION RH STG MANAGE BOWEL WITH ASSISTANCE Description STG Manage Bowel with mod I Assistance.  10/31/2017 5072 - Progressing by Evelena Asa, RN RH SKIN INTEGRITY RH STG SKIN FREE OF INFECTION/BREAKDOWN Description Patients skin will remain free from further infection or breakdown with min assist.   10/31/2017 2575 - Progressing by Evelena Asa, RN RH SAFETY RH STG ADHERE TO SAFETY PRECAUTIONS W/ASSISTANCE/DEVICE Description STG Adhere to Safety Precautions With min Assistance/Device.  10/31/2017 0518 - Progressing by Evelena Asa, RN

## 2017-11-01 ENCOUNTER — Inpatient Hospital Stay (HOSPITAL_COMMUNITY): Payer: Medicare Other | Admitting: Physical Therapy

## 2017-11-01 ENCOUNTER — Inpatient Hospital Stay (HOSPITAL_COMMUNITY): Payer: Medicare Other

## 2017-11-01 DIAGNOSIS — I10 Essential (primary) hypertension: Secondary | ICD-10-CM

## 2017-11-01 DIAGNOSIS — I69391 Dysphagia following cerebral infarction: Secondary | ICD-10-CM

## 2017-11-01 DIAGNOSIS — I519 Heart disease, unspecified: Secondary | ICD-10-CM

## 2017-11-01 NOTE — Progress Notes (Signed)
Speech Language Pathology Daily Session Note  Patient Details  Name: Ian [name] MRN: [id number] Date of Birth: 24-Aug-1926  Today's Date: 11/01/2017 SLP Individual Time: 7741-2878 SLP Individual Time Calculation (min): 23 min  Short Term Goals: Week 1: SLP Short Term Goal 1 (Week 1): Patient will consume current diet with minimal overt s/s of aspiration with Mod [name] verbal cues for use of small bites/sips and [name] slow rate of self-feeding.  SLP Short Term Goal 2 (Week 1): Patient will recall new, daily information with Mod [name] multimodal cues.  SLP Short Term Goal 3 (Week 1): Patient will demonstrate functional problem solving for basic and familiar tasks with Mod [name] multimodal cues.  SLP Short Term Goal 4 (Week 1): Patient will identify 2 cognitive deficits with Max [name] multimodal cues.  SLP Short Term Goal 5 (Week 1): Patient will demonstrate selective attention in [name] mildly distracting enviornment for 30 minutes with Mod [name] verbal cues for redirection.   Skilled Therapeutic Interventions: Pt seen this date to address cognitive goals. SLP facilitated session by providing Max [name] verbal encouragement to participate in [name] limited number of therapeutic tasks this session. Completed safety awareness task via pictures with Min [name] verbal cues for more elaborate responses; pt provides vague, verbal responses to questions likely secondary to language and cognitive deficits. Pt with incomplete verbal sequencing of "making coffee" and refused to give further information. Required Mod [name] verbal cues for divergent naming task when asked questions re: TV shows he likes to watch. Pt with decreased awareness of current deficits and continues to state that he wishes to go home. He did demonstrate some insight into memory deficits PTA; however, unable to verbalize why he is currently in the hospital requiring Max [name] verbal cues. Pt left in bed with bed alarm on and all items within reach; RN called at the end of the session given pt  was wishing to put shoes on. SLP to continue to follow and address POC.       Function:  Eating Eating     Eating Assist Level: Supervision or verbal cues;More than reasonable amount of time           Cognition Comprehension Comprehension assist level: Understands basic 50 - 74% of the time/ requires cueing 25 - 49% of the time  Expression   Expression assist level: Expresses basic 50 - 74% of the time/requires cueing 25 - 49% of the time. Needs to repeat parts of sentences.  Social Interaction Social Interaction assist level: Interacts appropriately 50 - 74% of the time - May be physically or verbally inappropriate.  Problem Solving Problem solving assist level: Solves basic 25 - 49% of the time - needs direction more than half the time to initiate, plan or complete simple activities  Memory Memory assist level: Recognizes or recalls less than 25% of the time/requires cueing greater than 75% of the time    Pain Pain Assessment Pain Assessment: No/denies pain  Therapy/Group: Individual Therapy  Ian [name] Ian [name] 11/01/2017, 10:08 AM

## 2017-11-01 NOTE — Progress Notes (Signed)
Physical Therapy Session Note  Patient Details  Name: Ian [name] MRN: [id number] Date of Birth: 11/27/1925  Today's Date: 11/01/2017 PT Individual Time: 5277-8242 PT Individual Time Calculation (min): 30 min   Short Term Goals: Week 1:  PT Short Term Goal 1 (Week 1): Pt will complete bed<>w/c with mod assist +1. PT Short Term Goal 2 (Week 1): Pt will ambulate 25 ft with max assist +1. PT Short Term Goal 3 (Week 1): Pt will initiate stair training.   Skilled Therapeutic Interventions/Progress Updates:    Pt semi-reclined in bed upon therapist arrival. Pt initially resistant to participating in therapy session and reports he has already done PT today. Attempted to educate pt on scheduling and importance of participation in all therapy sessions throughout the day. Pt is resistant but with encouragement and distraction is able to participate in session at bed level. Supine BLE therex x 15 reps: SLR, hip abd, BKFOs, heel slides, ankle pumps (AAROM on R). PROM stretches to RLE: gastroc and HS 3 x 60 sec each. Pt not oriented to place or situation throughout therapy session and becomes agitated if reminded of his location. Pt left semi-reclined in bed with bedrails in place, bed alarm in place, telesitter in place.  Therapy Documentation Precautions:  Precautions Precautions: Fall Restrictions Weight Bearing Restrictions: No  See Function Navigator for Current Functional Status.   Therapy/Group: Individual Therapy  Excell Seltzer, PT, DPT  11/01/2017, 3:11 PM

## 2017-11-01 NOTE — Progress Notes (Signed)
Physical Therapy Session Note  Patient Details  Name: Robertlee Rogacki MRN: [id number] Date of Birth: 19-Dec-1925  Today's Date: 11/01/2017 PT Individual Time: 1110-1206 PT Individual Time Calculation (min): 56 min   Short Term Goals: Week 1:  PT Short Term Goal 1 (Week 1): Pt will complete bed<>w/c with mod assist +1. PT Short Term Goal 2 (Week 1): Pt will ambulate 25 ft with max assist +1. PT Short Term Goal 3 (Week 1): Pt will initiate stair training.   Skilled Therapeutic Interventions/Progress Updates:  Pt was seen bedside in the am. Pt performed multiple sit to stand transfers with rolling walker and mod A. Pt performed stand pivot transfers with rolling walker and mod to max A and verbal cues. Pt ambulated 14 feet x 2 with rolling walker and mod to max A. Pt ambulated 25 feet x 2 with rolling walker and mod to max A with R AFO. Pt performed step taps 3 sets x 10 reps each. Pt returned to room following treatment and left sitting up in w/c with quick release belt in place and family at bedside.   Therapy Documentation Precautions:  Precautions Precautions: Fall Restrictions Weight Bearing Restrictions: No General:   Pain: Pain Assessment Pain Assessment: No/denies pain   See Function Navigator for Current Functional Status.   Therapy/Group: Individual Therapy  Dub Amis 11/01/2017, 12:54 PM

## 2017-11-01 NOTE — Progress Notes (Signed)
[place] [facility] PHYSICAL MEDICINE & REHABILITATION     PROGRESS NOTE    Subjective/Complaints: Patient seen lying in bed this morning. He is agitated and states that normal left sleeve chair. He requested to be left alone.  ROS: Ltd. due to cognitive/behavioral   Objective: Vital Signs: Blood pressure 135/82, pulse 90, temperature 97.6 F (36.4 C), temperature source Oral, resp. rate 18, weight 77.6 kg (171 lb 1.2 oz), SpO2 98 %. No results found. Recent Labs    10/30/17 0631  WBC 9.9  HGB 12.6*  HCT 36.5*  PLT 211   Recent Labs    10/30/17 0631  NA 136  K 4.0  CL 103  GLUCOSE 99  BUN 15  CREATININE 0.83  CALCIUM 10.3   CBG (last 3)  No results for input(s): GLUCAP in the last 72 hours.  Wt Readings from Last 3 Encounters:  11/01/17 77.6 kg (171 lb 1.2 oz)  10/29/17 78.2 kg (172 lb 6.4 oz)  02/01/16 79.8 kg (176 lb)    Physical Exam:  Constitutional: He appearswell-developedand well-nourished telesitter in place HENT: Normocephalic. Atraumatic. Eyes:EOMI. No discharge. Cardiovascular:RRR. No JVD  Respiratory:CTA Bilaterally. Normal effort  FW:YOVZC sounds are normal. He exhibitsno distension Skin.Warm and dry with scattered hematomas Musculoskeletal:No edema or tenderness in extremities Neurological: He isalert. HOH Followed simple commands.  A&O Follows simple commands but impaired insight and awareness.   Motor: LUE: 5 out of 5 proximal distal LLE: HF, 4/5 proximal distal RUE: 4-/5 proximal to distal RLE: HF, KE 2-3/5, ADF 0/5/PF. Sensation intact to light touchin all fours Psych: Agitated    Assessment/Plan: 1. Functional deficits and right hemiparesis secondary to right occipital infarct/rhabdomyolysis which require 3+ hours per day of interdisciplinary therapy in a comprehensive inpatient rehab setting. Physiatrist is providing close team supervision and 24 hour management of active medical problems listed below. Physiatrist and  rehab team continue to assess barriers to discharge/monitor patient progress toward functional and medical goals.  Function:  Bathing Bathing position Bathing activity did not occur: Refused    Bathing parts      Bathing assist        Upper Body Dressing/Undressing Upper body dressing Upper body dressing/undressing activity did not occur: Refused                  Upper body assist        Lower Body Dressing/Undressing Lower body dressing Lower body dressing/undressing activity did not occur: Refused                                Lower body assist        Toileting Toileting Toileting activity did not occur: No continent bowel/bladder event   Toileting steps completed by helper: Adjust clothing prior to toileting, Performs perineal hygiene, Adjust clothing after toileting    Toileting assist     Transfers Chair/bed transfer Chair/bed transfer activity did not occur: Refused Chair/bed transfer method: Stand pivot Chair/bed transfer assist level: Maximal assist (Pt 25 - 49%/lift and lower)       Locomotion Ambulation Ambulation activity did not occur: Safety/medical concerns   Max distance: 30 ft Assist level: 2 helpers(max assist + w/c follow for safety)   Wheelchair Wheelchair activity did not occur: Safety/medical concerns        Cognition Comprehension Comprehension assist level: Understands basic 50 - 74% of the time/ requires cueing 25 - 49% of the time  Expression  Expression assist level: Expresses basic 50 - 74% of the time/requires cueing 25 - 49% of the time. Needs to repeat parts of sentences.  Social Interaction Social Interaction assist level: Interacts appropriately 50 - 74% of the time - May be physically or verbally inappropriate.  Problem Solving Problem solving assist level: Solves basic 25 - 49% of the time - needs direction more than half the time to initiate, plan or complete simple activities  Memory Memory assist level:  Recognizes or recalls less than 25% of the time/requires cueing greater than 75% of the time   Medical Problem List and Plan: 1.  Decreased functional mobility, possible syncopal event with fall secondary to right occipital embolic infarction as well as rhabdomyolysis.    -Aspirin and Plavix 3 weeks then Plavix alone. Plan for 30 day monitor on discharge     -Continue therapies.  May need to consider other rehab options given patient's unwillingness to consistently participate with therapy.   -Tele-sitter for safety 2.  DVT Prophylaxis/Anticoagulation: SCDs. Monitor for any signs of DVT   -Dopplers negative 3. Pain Management: Tylenol as needed 4. Mood/dementia. Discuss baseline with wife.  5. Neuropsych: This patient is capable of making decisions on his own behalf. 6. Skin/Wound Care: Routine skin checks 7. Fluids/Electrolytes/Nutrition: Reviewed the importance of regular nutritional intake with patient.  Labs reviewed this morning and other than his low albumin labs are reasonable.   Team to encourage p.o. Intake   -dietician follow up 8. Hypertension. Norvasc 5 mg daily.     Controlled on 3/9 9. Hypothyroidism. Synthroid. TSH 9.181. Free T4 0.77. Continue current supplement for now 10. Dysphagia. Follow-up speech therapy.    Continue D3 thin liquids, advance diet as tolerated. 13. Diastolic dysfunction: Monitor for signs/symptoms of fluid overload  Filed Weights   10/30/17 0401 10/31/17 0100 11/01/17 0226  Weight: 77.5 kg (170 lb 13.7 oz) 77.3 kg (170 lb 4.9 oz) 77.6 kg (171 lb 1.2 oz)    Stable   LOS (Days) 3 A FACE TO FACE EVALUATION WAS PERFORMED  Zaya Kessenich Lorie Phenix, MD 11/01/2017 8:28 AM

## 2017-11-01 NOTE — Progress Notes (Signed)
Occupational Therapy Session Note  Patient Details  Name: Ian [name] MRN: [id number] Date of Birth: 01/06/1926  Today's Date: 11/01/2017 OT Missed Time: 75 Minutes Missed Time Reason: Patient unwilling/refused to participate without medical reason  Pt missed 75 min skilled OT d/t refusal without medical reason. Upon entering room NT setting up breakfast tray. OT introduced self and pt pushes tray away stating, "She is going to watch me eat with the intent to do therapy. I will not eat." Ot attempts to engage patient in conversation about leisure, hobbies, therapy, goals, DC if unwilling to participate, etc. OT attempt to engage pt in dressing activity, OOB activity or give pt choice on what he would like to work on. Pt states, "I would like to do nothing and for you to leave. There is a choice for you." Pt begins "drumming" utensils on tray table and says "some great therapy you do here." Pt proceeds to close eyes and ignore OT. Will follow up as available per POC and pt willingness to participate.   Therapy Documentation Precautions:  Precautions Precautions: Fall Restrictions Weight Bearing Restrictions: No  See Function Navigator for Current Functional Status.   Therapy/Group: Individual Therapy  Tonny Branch 11/01/2017, 8:17 AM

## 2017-11-02 ENCOUNTER — Inpatient Hospital Stay (HOSPITAL_COMMUNITY): Payer: Medicare Other

## 2017-11-02 NOTE — Progress Notes (Signed)
Occupational Therapy Session Note  Patient Details  Name: Ian [name] MRN: [id number] Date of Birth: Dec 06, 1925  Today's Date: 11/02/2017 OT Individual Time: 8032-1224 OT Individual Time Calculation (min): 55 min    Short Term Goals: Week 1:  OT Short Term Goal 1 (Week 1): Pt will perform toilet transfer with max A in order to decrease level of assistance for functional transfer. OT Short Term Goal 2 (Week 1): Pt will perform UB dressing with mod A. OT Short Term Goal 3 (Week 1): Pt will perform LB dressing with max A.   Skilled Therapeutic Interventions/Progress Updates:    1;1. Pt originally resistant to tx, however OT bringing in breakfast tray and setting up pt willing to begin with eating. Pt requires A to open orange juice container and locate fork on tray,however able to [employer] utensils/manage packages otherwise. Pt requires cueing for small bites and sips while eating but patient ignoring when drinking OJ. Pt stand pivot transfers with touching A EOB>w/c and able to ask "are brakes locked" prior to transfer. Pt dresses at sit to stand level with touching A for standing balance. Pt dons new sweatshirt with A for head and to pull shirt down back. Pt able to thread LLE into pants/don L shoe cossing to seated figure 4 with cue, however OT A with RLE pant leg, R shoe, and advancing pants past hips as pt keeps stating "you pull up the back" despite encouragement for pt to complete himself. Pt combs hair at sink with supervision. As OT changes bed linens, pt applies 3 pillow cases to pillows to "help out OT." Exited session with pt seatedin w/c call light in reach and QRB donned,  Therapy Documentation Precautions:  Precautions Precautions: Fall Restrictions Weight Bearing Restrictions: No General:   Vital Signs: See Function Navigator for Current Functional Status.   Therapy/Group: Individual Therapy  Tonny Branch 11/02/2017, 7:57 AM

## 2017-11-02 NOTE — Progress Notes (Signed)
Occupational Therapy Note  Patient Details  Name: Ian [name] MRN: [id number] Date of Birth: 1926/02/21  Today's Date: 11/02/2017 OT Individual Time: 1000-1053 OT Individual Time Calculation (min): 53 min   Makeup Session  Pt denies pain Individual Therapy  Pt asleep in recliner upon arrival but easily aroused.  Pt agreeable to therapy although he stated that I was supposed to see him at 7am.  Reminded pt that another therapist had worked with him at 7.  Pt denied this but stated that "it doesn't matter."  Pt required mod A for stand pivot transfer to w/c with max verbal cues for sequencing and safety awareness.  Pt initially engaged in BUE therex on [employer] (7 min X 2 at workload 4) and BLE therex on Nustep (10 mins at workload 6). Pt required min A for stand pivot transfers to seat on equipment. Pt stated that he went to the [employer] regularly.  Pt also engaged in standing tasks at hi-lo table with min A for sit<>stand, steady A for standing balance, and min verbal cues for posture.  Pt required multiple rest breaks during standing tasks.  Pt returned to room and transferred to recliner.  Pt remained in recliner with chair alarm activated and Telesitter present.   Leotis Shames [facility] 11/02/2017, 10:58 AM

## 2017-11-02 NOTE — Progress Notes (Signed)
PHYSICAL MEDICINE & REHABILITATION     PROGRESS NOTE    Subjective/Complaints: Patient seen lying in bed this morning. He states he slept well overnight. He is upset about being woken up. He denies complaints.  ROS: Ltd. due to cognitive/behavioral , but appears to deny CP, SOB, nausea, vomiting, diarrhea.  Objective: Vital Signs: Blood pressure (!) 144/87, pulse 87, temperature 97.6 F (36.4 C), temperature source Oral, resp. rate 18, weight 78 kg (171 lb 15.3 oz), SpO2 96 %. No results found. Recent Labs    10/30/17 0631  WBC 9.9  HGB 12.6*  HCT 36.5*  PLT 211   Recent Labs    10/30/17 0631  NA 136  K 4.0  CL 103  GLUCOSE 99  BUN 15  CREATININE 0.83  CALCIUM 10.3   CBG (last 3)  No results for input(s): GLUCAP in the last 72 hours.  Wt Readings from Last 3 Encounters:  11/02/17 78 kg (171 lb 15.3 oz)  10/29/17 78.2 kg (172 lb 6.4 oz)  02/01/16 79.8 kg (176 lb)    Physical Exam:  Constitutional: He appearswell-developedand well-nourished telesitter in place HENT: Normocephalic. Atraumatic. Eyes:EOMI. No discharge. Cardiovascular:RRR. No JVD  Respiratory:CTA Bilaterally. Normal effort  HQ:IONGE sounds are normal. He exhibitsno distension Skin.Warm and dry with scattered hematomas Musculoskeletal:No edema or tenderness in extremities Neurological: He isalert. HOH Followed simple commands.  Alert Follows simple commands but impaired insight and awareness.   Motor: LUE: 5/5 proximal distal LLE: HF, 4/5 proximal distal RUE: 4-/5 proximal to distal RLE: HF, KE 2-3/5, ADF 0/5/PF (? Limited by participation). Psych: Irritable  Assessment/Plan: 1. Functional deficits and right hemiparesis secondary to right occipital infarct/rhabdomyolysis which require 3+ hours per day of interdisciplinary therapy in a comprehensive inpatient rehab setting. Physiatrist is providing close team supervision and 24 hour management of active medical  problems listed below. Physiatrist and rehab team continue to assess barriers to discharge/monitor patient progress toward functional and medical goals.  Function:  Bathing Bathing position Bathing activity did not occur: Refused    Bathing parts      Bathing assist        Upper Body Dressing/Undressing Upper body dressing Upper body dressing/undressing activity did not occur: Refused                  Upper body assist        Lower Body Dressing/Undressing Lower body dressing Lower body dressing/undressing activity did not occur: Refused                                Lower body assist        Toileting Toileting Toileting activity did not occur: No continent bowel/bladder event   Toileting steps completed by helper: Adjust clothing prior to toileting, Performs perineal hygiene, Adjust clothing after toileting Toileting Assistive Devices: Grab bar or rail  Toileting assist Assist level: More than reasonable time, Two helpers   Transfers Chair/bed transfer Chair/bed transfer activity did not occur: Refused Chair/bed transfer method: Stand pivot Chair/bed transfer assist level: Maximal assist (Pt 25 - 49%/lift and lower) Chair/bed transfer assistive device: Medical sales representative Ambulation activity did not occur: Safety/medical concerns   Max distance: 25 Assist level: Maximal assist (Pt 25 - 49%)   Wheelchair Wheelchair activity did not occur: Safety/medical concerns Type: Manual Max wheelchair distance: 125 Assist Level: Touching or steadying assistance (Pt > 75%)  Cognition  Comprehension Comprehension assist level: Understands basic 50 - 74% of the time/ requires cueing 25 - 49% of the time  Expression Expression assist level: Expresses basic 50 - 74% of the time/requires cueing 25 - 49% of the time. Needs to repeat parts of sentences.  Social Interaction Social Interaction assist level: Interacts appropriately 50 - 74% of the time -  May be physically or verbally inappropriate.  Problem Solving Problem solving assist level: Solves basic 25 - 49% of the time - needs direction more than half the time to initiate, plan or complete simple activities  Memory Memory assist level: Recognizes or recalls less than 25% of the time/requires cueing greater than 75% of the time   Medical Problem List and Plan: 1.  Decreased functional mobility, possible syncopal event with fall secondary to right occipital embolic infarction as well as rhabdomyolysis.    -Aspirin and Plavix 3 weeks then Plavix alone. Plan for 30 day monitor on discharge     -Continue therapies.  May need to consider other rehab options given patient's unwillingness to consistently participate with therapy.   -Tele-sitter for safety 2.  DVT Prophylaxis/Anticoagulation: SCDs. Monitor for any signs of DVT   -Dopplers negative 3. Pain Management: Tylenol as needed 4. Mood/dementia. Discuss baseline with wife.  5. Neuropsych: This patient is ?fully capable of making decisions on his own behalf. 6. Skin/Wound Care: Routine skin checks 7. Fluids/Electrolytes/Nutrition: Reviewed the importance of regular nutritional intake with patient.     Team to encourage p.o. Intake   -dietician follow up 8. Hypertension. Norvasc 5 mg daily.     Elevated this a.m., otherwise relatively controlled, consider increasing medications if persistently elevated. 9. Hypothyroidism. Synthroid. TSH 9.181. Free T4 0.77. Continue current supplement for now 10. Dysphagia. Follow-up speech therapy.    Continue D3 thin liquids, advance diet as tolerated. 13. Diastolic dysfunction: Monitor for signs/symptoms of fluid overload  Filed Weights   10/31/17 0100 11/01/17 0226 11/02/17 0447  Weight: 77.3 kg (170 lb 4.9 oz) 77.6 kg (171 lb 1.2 oz) 78 kg (171 lb 15.3 oz)    Relatively stable on 3/10   LOS (Days) 4 A FACE TO FACE EVALUATION WAS PERFORMED  Ankit Lorie Phenix, MD 11/02/2017 7:14 AM

## 2017-11-03 ENCOUNTER — Inpatient Hospital Stay (HOSPITAL_COMMUNITY): Payer: Medicare Other | Admitting: Speech Pathology

## 2017-11-03 ENCOUNTER — Inpatient Hospital Stay (HOSPITAL_COMMUNITY): Payer: Medicare Other | Admitting: Physical Therapy

## 2017-11-03 ENCOUNTER — Inpatient Hospital Stay (HOSPITAL_COMMUNITY): Payer: Medicare Other | Admitting: Occupational Therapy

## 2017-11-03 NOTE — Progress Notes (Signed)
Occupational Therapy Session Note  Patient Details  Name: Ian [name] MRN: [id number] Date of Birth: Nov 06, 1925  Today's Date: 11/03/2017 OT Individual Time: 2162-4469 OT Individual Time Calculation (min): 70 min   Short Term Goals: Week 1:  OT Short Term Goal 1 (Week 1): Pt will perform toilet transfer with max A in order to decrease level of assistance for functional transfer. OT Short Term Goal 2 (Week 1): Pt will perform UB dressing with mod A. OT Short Term Goal 3 (Week 1): Pt will perform LB dressing with max A.     Skilled Therapeutic Interventions/Progress Updates:    Pt greeted at RN station. Required encouragement to participate, however amenable to B UE therex using 2# bar/yellow theraband. Pt provided visual/tactile cues for proper technique for 10-15 reps each exercise. In between exercises, pt completed UE stretches in sitting/standing positions due to hx Rt rotator cuff injury and c/o discomfort at times (Mod A sit<stand at elevated table). Continued UB strength remediation with use of zoom ball after. Pt opening up to therapist during session, describing his gym routine and enjoyment of cooking. Also talked about his sons/grandchildren who live in [place] and making jokes. At end of tx pt was escorted back to RN station and left with safety belt fastened and all needs.    No episodes of verbal aggression during session.    Therapy Documentation Precautions:  Precautions Precautions: Fall Restrictions Weight Bearing Restrictions: No  See Function Navigator for Current Functional Status.   Therapy/Group: Individual Therapy  Taige Housman A Mella Inclan 11/03/2017, 12:44 PM

## 2017-11-03 NOTE — Progress Notes (Signed)
Speech Language Pathology Daily Session Note  Patient Details  Name: Ian [name] MRN: [id number] Date of Birth: 1925-11-26  Today's Date: 11/03/2017 SLP Individual Time: 1130-1200 SLP Individual Time Calculation (min): 30 min  Short Term Goals: Week 1: SLP Short Term Goal 1 (Week 1): Patient will consume current diet with minimal overt s/s of aspiration with Mod A verbal cues for use of small bites/sips and a slow rate of self-feeding.  SLP Short Term Goal 2 (Week 1): Patient will recall new, daily information with Mod A multimodal cues.  SLP Short Term Goal 3 (Week 1): Patient will demonstrate functional problem solving for basic and familiar tasks with Mod A multimodal cues.  SLP Short Term Goal 4 (Week 1): Patient will identify 2 cognitive deficits with Max A multimodal cues.  SLP Short Term Goal 5 (Week 1): Patient will demonstrate selective attention in a mildly distracting enviornment for 30 minutes with Mod A verbal cues for redirection.   Skilled Therapeutic Interventions: Skilled treatment session focused on cognitive goals. SLP facilitated session by providing total A for recall of his current medications and their functions and Max A multimodal cues for utilization of external aid to maximize recall of functional information during medication management task. Patient also required Max A verbal cues for problem solving during a BID pill box organization task. Patient left upright in wheelchair with quick release belt in place and wife present. Continue with current plan of care.      Function:  Cognition Comprehension Comprehension assist level: Understands basic 50 - 74% of the time/ requires cueing 25 - 49% of the time  Expression   Expression assist level: Expresses basic 50 - 74% of the time/requires cueing 25 - 49% of the time. Needs to repeat parts of sentences.  Social Interaction Social Interaction assist level: Interacts appropriately 50 - 74% of the time - May be  physically or verbally inappropriate.  Problem Solving Problem solving assist level: Solves basic 25 - 49% of the time - needs direction more than half the time to initiate, plan or complete simple activities  Memory Memory assist level: Recognizes or recalls less than 25% of the time/requires cueing greater than 75% of the time    Pain No/Denies Pain   Therapy/Group: Individual Therapy  Ian [name] 11/03/2017, 12:45 PM

## 2017-11-03 NOTE — Progress Notes (Signed)
Physical Therapy Session Note  Patient Details  Name: Ian [name] MRN: [id number] Date of Birth: May 30, 1926  Today's Date: 11/03/2017 PT Individual Time: 0160-1093 PT Individual Time Calculation (min): 30 min   Short Term Goals: Week 1:  PT Short Term Goal 1 (Week 1): Pt will complete bed<>w/c with mod assist +1. PT Short Term Goal 2 (Week 1): Pt will ambulate 25 ft with max assist +1. PT Short Term Goal 3 (Week 1): Pt will initiate stair training.   Skilled Therapeutic Interventions/Progress Updates:  Pt received asleep in bed but easily awakened but irritated. No c/o pain reported. Pt wanting to eat breakfast.  Pt transferred supine>sitting EOB with cuing to transfer RLE to EOB but pt able to complete movement with supervision overall. Pt donned pants with assistance to thread on RLE and assistance for standing balance while pulling pants over hips. Pt completes stand pivot bed>w/c with min assist but with improved ability to pivot to chair. Pt continues to demonstrate impaired safety awareness as he will attempt to stand/transfer without w/c being close by. Once in w/c pt donned tennis shoes with min assist for R shoe only but pt able to tie laces on both shoes. Pt needing new brief but strongly declined therapist assisting with hygiene. Transported pt to dayroom and set pt up with meal tray. Pt very agitated by having therapist sit with him during breakfast; pt appears to attend to L/R sides of tray equally and uses R hand to assist with opening containers and feeding himself. Attempted to engage in conversation but pt easily irritated with therapist. Pt left in care of NT.   Therapy Documentation Precautions:  Precautions Precautions: Fall Restrictions Weight Bearing Restrictions: No General: PT Amount of Missed Time (min): 30 Minutes PT Missed Treatment Reason: (breakfast)   See Function Navigator for Current Functional Status.   Therapy/Group: Individual Therapy  Waunita Schooner 11/03/2017, 8:48 AM

## 2017-11-03 NOTE — Progress Notes (Signed)
[facility] PHYSICAL MEDICINE & REHABILITATION     PROGRESS NOTE    Subjective/Complaints: Asleep upon arrival. Upset that I woke him up.   ROS: Patient denies fever, rash, sore throat, blurred vision, nausea, vomiting, diarrhea, cough, shortness of breath or chest pain, joint or back pain, headache, or mood change.   Objective: Vital Signs: Blood pressure 125/84, pulse 90, temperature (!) 97.4 F (36.3 C), temperature source Oral, resp. rate 18, weight 78 kg (171 lb 15.3 oz), SpO2 98 %. No results found. No results for input(s): WBC, HGB, HCT, PLT in the last 72 hours. No results for input(s): NA, K, CL, GLUCOSE, BUN, CREATININE, CALCIUM in the last 72 hours.  Invalid input(s): CO CBG (last 3)  No results for input(s): GLUCAP in the last 72 hours.  Wt Readings from Last 3 Encounters:  11/02/17 78 kg (171 lb 15.3 oz)  10/29/17 78.2 kg (172 lb 6.4 oz)  02/01/16 79.8 kg (176 lb)    Physical Exam:  Constitutional: He appearswell-developedand well-nourished telesitter in place HENT: Normocephalic. Atraumatic. Eyes: EOMI. Cardiovascular:RRR without murmur. No JVD  Respiratory:CTA Bilaterally without wheezes or rales. Normal effort  ZO:XWRUE sounds are normal. He exhibitsno distension Skin.Warm and dry with scattered hematomas Musculoskeletal:No edema or tenderness in extremities Neurological: He isalert. Poor insight and awarness. Motor: LUE: 5/5 proximal distal LLE: HF, 4/5 proximal distal RUE: 4-/5 proximal to distal RLE: HF, KE 2-3/5, ADF 0/5/PF (inconsistent participation). Psych: Irritable, avoids making eye contact  Assessment/Plan: 1. Functional deficits and right hemiparesis secondary to right occipital infarct/rhabdomyolysis which require 3+ hours per day of interdisciplinary therapy in a comprehensive inpatient rehab setting. Physiatrist is providing close team supervision and 24 hour management of active medical problems listed below. Physiatrist and  rehab team continue to assess barriers to discharge/monitor patient progress toward functional and medical goals.  Function:  Bathing Bathing position Bathing activity did not occur: Refused    Bathing parts      Bathing assist        Upper Body Dressing/Undressing Upper body dressing Upper body dressing/undressing activity did not occur: Refused What is the patient wearing?: Pull over shirt/dress     Pull over shirt/dress - Perfomed by patient: Thread/unthread left sleeve, Thread/unthread right sleeve Pull over shirt/dress - Perfomed by helper: Put head through opening, Pull shirt over trunk        Upper body assist        Lower Body Dressing/Undressing Lower body dressing Lower body dressing/undressing activity did not occur: Refused What is the patient wearing?: Pants, Shoes, Non-skid slipper socks     Pants- Performed by patient: Thread/unthread left pants leg Pants- Performed by helper: Pull pants up/down, Thread/unthread right pants leg   Non-skid slipper socks- Performed by helper: Don/doff right sock, Don/doff left sock     Shoes - Performed by patient: Don/doff left shoe, Fasten right, Fasten left Shoes - Performed by helper: Don/doff right shoe          Lower body assist        Toileting Toileting Toileting activity did not occur: No continent bowel/bladder event   Toileting steps completed by helper: Adjust clothing prior to toileting, Performs perineal hygiene, Adjust clothing after toileting Toileting Assistive Devices: Grab bar or rail  Toileting assist Assist level: More than reasonable time, Two helpers   Transfers Chair/bed transfer Chair/bed transfer activity did not occur: Refused Chair/bed transfer method: Stand pivot Chair/bed transfer assist level: Touching or steadying assistance (Pt > 75%) Chair/bed transfer assistive  device: Financial risk analyst activity did not occur: Safety/medical concerns   Max distance:  25 Assist level: Maximal assist (Pt 25 - 49%)   Wheelchair Wheelchair activity did not occur: Safety/medical concerns Type: Manual Max wheelchair distance: 125 Assist Level: Touching or steadying assistance (Pt > 75%)  Cognition Comprehension Comprehension assist level: Understands basic 50 - 74% of the time/ requires cueing 25 - 49% of the time  Expression Expression assist level: Expresses basic 50 - 74% of the time/requires cueing 25 - 49% of the time. Needs to repeat parts of sentences.  Social Interaction Social Interaction assist level: Interacts appropriately 50 - 74% of the time - May be physically or verbally inappropriate.  Problem Solving Problem solving assist level: Solves basic 25 - 49% of the time - needs direction more than half the time to initiate, plan or complete simple activities  Memory Memory assist level: Recognizes or recalls less than 25% of the time/requires cueing greater than 75% of the time   Medical Problem List and Plan: 1.  Decreased functional mobility, possible syncopal event with fall secondary to right occipital embolic infarction as well as rhabdomyolysis.    -Aspirin and Plavix 3 weeks then Plavix alone. Plan for 30 day monitor on discharge     -Continue therapies.  D/w team and family re: participation and most appropriate venue of care moving forward given his behavior.   -Tele-sitter for safety 2.  DVT Prophylaxis/Anticoagulation: SCDs. Monitor for any signs of DVT   -Dopplers negative 3. Pain Management: Tylenol as needed 4. Mood/dementia. Discuss baseline with wife.  5. Neuropsych: This patient is ?fully capable of making decisions on his own behalf. 6. Skin/Wound Care: Routine skin checks 7. Fluids/Electrolytes/Nutrition: intake borderline.     Team to encourage p.o. Intake   -dietician follow up 8. Hypertension. Norvasc 5 mg daily.     Better control this morning---no changes 9. Hypothyroidism. Synthroid. TSH 9.181. Free T4 0.77. Continue  current supplement for now 10. Dysphagia. Follow-up speech therapy.    Continue D3 thin liquids, advance diet as tolerated.   -intake borderline 13. Diastolic dysfunction: no current signs/symptoms of fluid overload  Filed Weights   10/31/17 0100 11/01/17 0226 11/02/17 0447  Weight: 77.3 kg (170 lb 4.9 oz) 77.6 kg (171 lb 1.2 oz) 78 kg (171 lb 15.3 oz)    Relatively stable on 3/11---   LOS (Days) 5 A FACE TO FACE EVALUATION WAS PERFORMED  Meredith Staggers, MD 11/03/2017 8:56 AM

## 2017-11-04 ENCOUNTER — Inpatient Hospital Stay (HOSPITAL_COMMUNITY): Payer: Medicare Other | Admitting: Speech Pathology

## 2017-11-04 ENCOUNTER — Inpatient Hospital Stay (HOSPITAL_COMMUNITY): Payer: Medicare Other | Admitting: Occupational Therapy

## 2017-11-04 ENCOUNTER — Inpatient Hospital Stay (HOSPITAL_COMMUNITY): Payer: Medicare Other | Admitting: Physical Therapy

## 2017-11-04 NOTE — Progress Notes (Signed)
[facility] PHYSICAL MEDICINE & REHABILITATION     PROGRESS NOTE    Subjective/Complaints: I just awoken prior to me entering.  Irritable as usual  ROS: Limited due to cognitive/behavioral .   Objective: Vital Signs: Blood pressure 131/75, pulse 91, temperature 98 F (36.7 C), temperature source Oral, resp. rate 17, weight 71.1 kg (156 lb 12 oz), SpO2 95 %. No results found. No results for input(s): WBC, HGB, HCT, PLT in the last 72 hours. No results for input(s): NA, K, CL, GLUCOSE, BUN, CREATININE, CALCIUM in the last 72 hours.  Invalid input(s): CO CBG (last 3)  No results for input(s): GLUCAP in the last 72 hours.  Wt Readings from Last 3 Encounters:  11/04/17 71.1 kg (156 lb 12 oz)  10/29/17 78.2 kg (172 lb 6.4 oz)  02/01/16 79.8 kg (176 lb)    Physical Exam:  Constitutional: No distress . Vital signs reviewed.  Tele-sitter in place HEENT: EOMI, oral membranes moist Cardiovascular: RRR without murmur. No JVD    Respiratory: CTA Bilaterally without wheezes or rales. Normal effort    GI: BS +, non-tender, non-distended   Skin.Warm and dry with scattered hematomas improving Musculoskeletal:No edema or tenderness in extremities Neurological: He isalert. Poor insight and awarness. Motor: LUE: 5/5 proximal distal LLE: HF, 4/5 proximal distal RUE: 4-/5 proximal to distal RLE: HF, KE 2-3/5, ADF 0-1?/5/PF --- does not participate consistently Psych: Irritable, avoids making eye contact  Assessment/Plan: 1. Functional deficits and right hemiparesis secondary to right occipital infarct/rhabdomyolysis which require 3+ hours per day of interdisciplinary therapy in a comprehensive inpatient rehab setting. Physiatrist is providing close team supervision and 24 hour management of active medical problems listed below. Physiatrist and rehab team continue to assess barriers to discharge/monitor patient progress toward functional and medical goals.  Function:  Bathing Bathing  position Bathing activity did not occur: Refused    Bathing parts      Bathing assist        Upper Body Dressing/Undressing Upper body dressing Upper body dressing/undressing activity did not occur: Refused What is the patient wearing?: Pull over shirt/dress     Pull over shirt/dress - Perfomed by patient: Thread/unthread left sleeve, Thread/unthread right sleeve Pull over shirt/dress - Perfomed by helper: Put head through opening, Pull shirt over trunk        Upper body assist        Lower Body Dressing/Undressing Lower body dressing Lower body dressing/undressing activity did not occur: Refused What is the patient wearing?: Pants, Shoes, Non-skid slipper socks     Pants- Performed by patient: Thread/unthread left pants leg Pants- Performed by helper: Pull pants up/down, Thread/unthread right pants leg   Non-skid slipper socks- Performed by helper: Don/doff right sock, Don/doff left sock     Shoes - Performed by patient: Don/doff left shoe, Fasten right, Fasten left Shoes - Performed by helper: Don/doff right shoe          Lower body assist        Toileting Toileting Toileting activity did not occur: No continent bowel/bladder event   Toileting steps completed by helper: Adjust clothing prior to toileting, Performs perineal hygiene, Adjust clothing after toileting Toileting Assistive Devices: Grab bar or rail  Toileting assist Assist level: More than reasonable time   Transfers Chair/bed transfer Chair/bed transfer activity did not occur: Refused Chair/bed transfer method: Stand pivot Chair/bed transfer assist level: Touching or steadying assistance (Pt > 75%) Chair/bed transfer assistive device: Financial risk analyst  activity did not occur: Safety/medical concerns   Max distance: 25 Assist level: Maximal assist (Pt 25 - 49%)   Wheelchair Wheelchair activity did not occur: Safety/medical concerns Type: Manual Max wheelchair  distance: 125 Assist Level: Touching or steadying assistance (Pt > 75%)  Cognition Comprehension Comprehension assist level: Understands basic 50 - 74% of the time/ requires cueing 25 - 49% of the time  Expression Expression assist level: Expresses basic 50 - 74% of the time/requires cueing 25 - 49% of the time. Needs to repeat parts of sentences.  Social Interaction Social Interaction assist level: Interacts appropriately 50 - 74% of the time - May be physically or verbally inappropriate.  Problem Solving Problem solving assist level: Solves basic 25 - 49% of the time - needs direction more than half the time to initiate, plan or complete simple activities  Memory Memory assist level: Recognizes or recalls less than 25% of the time/requires cueing greater than 75% of the time   Medical Problem List and Plan: 1.  Decreased functional mobility, possible syncopal event with fall secondary to right occipital embolic infarction as well as rhabdomyolysis.    -Aspirin and Plavix 3 weeks then Plavix alone. Plan for 30 day monitor on discharge     -Team conference today.  We will need to discuss therapy plan moving forward   -Tele-sitter for safety 2.  DVT Prophylaxis/Anticoagulation: SCDs. Monitor for any signs of DVT   -Dopplers negative 3. Pain Management: Tylenol as needed 4. Mood/dementia. Discuss baseline with wife.  5. Neuropsych: This patient is ?fully capable of making decisions on his own behalf. 6. Skin/Wound Care: Routine skin checks 7. Fluids/Electrolytes/Nutrition: A little better yesterday   Team to encourage p.o. Intake   -dietician follow up   -Follow-up lab work on Thursday 8. Hypertension. Norvasc 5 mg daily.     Better control this morning---no changes 9. Hypothyroidism. Synthroid. TSH 9.181. Free T4 0.77. Continue current supplement for now 10. Dysphagia. Follow-up speech therapy.    Continue D3 thin liquids, advance diet as tolerated.   -intake borderline as above 13.  Diastolic dysfunction: no current signs/symptoms of fluid overload  Filed Weights   11/01/17 0226 11/02/17 0447 11/04/17 0347  Weight: 77.6 kg (171 lb 1.2 oz) 78 kg (171 lb 15.3 oz) 71.1 kg (156 lb 12 oz)    Stable 3/12---   LOS (Days) 6 A FACE TO FACE EVALUATION WAS PERFORMED  Meredith Staggers, MD 11/04/2017 8:44 AM

## 2017-11-04 NOTE — Progress Notes (Signed)
Occupational Therapy Session Note  Patient Details  Name: Ian [name] MRN: [id number] Date of Birth: Jan 04, 1926  Today's Date: 11/04/2017 OT Individual Time: 3013-1438 OT Individual Time Calculation (min): 40 min  and Today's Date: 11/04/2017 OT Missed Time: 20 Minutes Missed Time Reason: Patient unwilling/refused to participate without medical reason   Short Term Goals: Week 1:  OT Short Term Goal 1 (Week 1): Pt will perform toilet transfer with max A in order to decrease level of assistance for functional transfer. OT Short Term Goal 2 (Week 1): Pt will perform UB dressing with mod A. OT Short Term Goal 3 (Week 1): Pt will perform LB dressing with max A.   Skilled Therapeutic Interventions/Progress Updates:    Upon entering the room, pt seated in bed with NT present and reporting pt just used urinal. Pt refusing to allow staff to change clothing for hygiene. Pt required set up A to open containers for breakfast. Pt required min verbal cues for small bites but pt's irritation increasing with conversation/cuing attempts. Pt coughing multiple times while food in mouth. SLP notified for safety. Pt refusing all other forms of therapeutic intervention at this time. Pt stating, " I'm not doing anything until after 10." Pt remained in bed with bed alarm activated and call bell within reach. Pt missing 20 minutes secondary to pt refusal  Therapy Documentation Precautions:  Precautions Precautions: Fall Restrictions Weight Bearing Restrictions: No General: General OT Amount of Missed Time: 20 Minutes  See Function Navigator for Current Functional Status.   Therapy/Group: Individual Therapy  Gypsy Decant 11/04/2017, 9:00 AM

## 2017-11-04 NOTE — Progress Notes (Signed)
Speech Language Pathology Daily Session Note  Patient Details  Name: Ian [name] MRN: [id number] Date of Birth: 07-25-1926  Today's Date: 11/04/2017 SLP Individual Time: 1130-1150 SLP Individual Time Calculation (min): 20 min  Missed Time: 10 minutes   Short Term Goals: Week 1: SLP Short Term Goal 1 (Week 1): Patient will consume current diet with minimal overt s/s of aspiration with Mod A verbal cues for use of small bites/sips and a slow rate of self-feeding.  SLP Short Term Goal 2 (Week 1): Patient will recall new, daily information with Mod A multimodal cues.  SLP Short Term Goal 3 (Week 1): Patient will demonstrate functional problem solving for basic and familiar tasks with Mod A multimodal cues.  SLP Short Term Goal 4 (Week 1): Patient will identify 2 cognitive deficits with Max A multimodal cues.  SLP Short Term Goal 5 (Week 1): Patient will demonstrate selective attention in a mildly distracting enviornment for 30 minutes with Mod A verbal cues for redirection.   Skilled Therapeutic Interventions: Skilled treatment session focused on cognitive goals. Patient agreeable to getting out of bed and changing clothes if TV was left on in the background and if patient did not have to leave the room. Patient required Max-Total A multimodal cues for problem solving and sequencing during transfer to wheelchair, changing clothes and for transfer to commode. Patient's wife present and education in regards to goals of skilled SLP intervention and patient's overall participation. She verbalized understanding but will need reinforcement. Patient left upright in wheelchair with quick release belt in place, chair alarm on, wife present and lunch present. Continue with current plan of care.      Function:  Eating Eating   Modified Consistency Diet: Yes Eating Assist Level: Set up assist for   Eating Set Up Assist For: Opening containers       Cognition Comprehension Comprehension assist level:  Understands basic 50 - 74% of the time/ requires cueing 25 - 49% of the time  Expression   Expression assist level: Expresses basic 50 - 74% of the time/requires cueing 25 - 49% of the time. Needs to repeat parts of sentences.  Social Interaction Social Interaction assist level: Interacts appropriately 50 - 74% of the time - May be physically or verbally inappropriate.  Problem Solving Problem solving assist level: Solves basic 25 - 49% of the time - needs direction more than half the time to initiate, plan or complete simple activities  Memory Memory assist level: Recognizes or recalls less than 25% of the time/requires cueing greater than 75% of the time    Pain Pain Assessment Pain Assessment: No/denies pain Pain Score: 0-No pain Faces Pain Scale: No hurt  Therapy/Group: Individual Therapy  Jailyn Leeson 11/04/2017, 12:44 PM

## 2017-11-04 NOTE — Progress Notes (Signed)
Physical Therapy Session Note  Patient Details  Name: Ian [name] MRN: [id number] Date of Birth: [birth year]  Today's Date: 11/04/2017 PT Individual Time: 3875-6433 PT Individual Time Calculation (min): 83 min   Short Term Goals: Week 1:  PT Short Term Goal 1 (Week 1): Pt will complete bed<>w/c with mod assist +1. PT Short Term Goal 2 (Week 1): Pt will ambulate 25 ft with max assist +1. PT Short Term Goal 3 (Week 1): Pt will initiate stair training.   Skilled Therapeutic Interventions/Progress Updates:  Pt received in w/c with wife present for session. Pt with occasional c/o R ankle pain. Pt's wife inquiring about pt's diagnosis with therapist providing education regarding impairments, deficits, and anticipated level of care upon d/c (24 hr assist). Gait training x 30 ft + 30 ft with rail in hallway & min assist + w/c follow for safety; pt demonstrates impaired weight shifting L<>R, RLE advancement, impaired/absent dorsiflexion RLE, and forward flexed posture. Attempted gait with RW multiple times but pt very fearful and ultimately refusing to attempt. Pt engaged in game of horseshoes with total assist for standing balance 2/2 significant posterior lean and pt intermittently holding to w/c armrest. Transitioned to playing ball toss for standing balance without UE support but pt continuing to lean back on w/c and when moved pt requires total assist for balance; pt standing with B knees flexed & unable to extend. Pt negotiated 8 steps (3") + 12 steps (6" + 3") with B rails and min assist with impaired dorsiflexion and coordination/placement RLE. Pt without any R knee buckling when descending 6" steps. Pt propelled w/c around unit with alternating BUE/BLE and supervision overall; when using LE pt with difficulty advancing RLE. Pt utilized cybex kinetron from w/c level on 30 cm/sec with task focusing on BLE strengthening & NMR. Returned to room and pt not oriented to date; therapist provided pt with  calender on wall. Pt left in w/c in care of NT, wife present in room.  Therapy Documentation Precautions:  Precautions Precautions: Fall Restrictions Weight Bearing Restrictions: No   See Function Navigator for Current Functional Status.   Therapy/Group: Individual Therapy  Waunita Schooner 11/04/2017, 4:56 PM

## 2017-11-04 NOTE — Progress Notes (Signed)
SLP Cancellation Note  Patient Details Name: Ian [name] MRN: [id number] DOB: Oct 01, 1925   Cancelled treatment:       Patient missed 45 minutes of skilled SLP intervention due to refusal despite Max encouragement from SLP. RN aware.                                                                                                 Hansini Clodfelter 11/04/2017, 12:43 PM

## 2017-11-05 ENCOUNTER — Inpatient Hospital Stay (HOSPITAL_COMMUNITY): Payer: Medicare Other | Admitting: Physical Therapy

## 2017-11-05 ENCOUNTER — Inpatient Hospital Stay (HOSPITAL_COMMUNITY): Payer: Medicare Other | Admitting: Speech Pathology

## 2017-11-05 ENCOUNTER — Other Ambulatory Visit: Payer: Self-pay

## 2017-11-05 ENCOUNTER — Inpatient Hospital Stay (HOSPITAL_COMMUNITY): Payer: Medicare Other | Admitting: Occupational Therapy

## 2017-11-05 NOTE — Patient Care Conference (Signed)
Inpatient RehabilitationTeam Conference and Plan of Care Update Date: 11/04/2017   Time: 2:30 PM    Patient Name: Ian [name]      Medical Record Number: [id number]  Date of Birth: 1926-07-28 Sex: Male         Room/Bed: 4W15C/4W15C-01 Payor Info: Payor: [employer] MEDICARE / Plan: [employer] MEDICARE / Product Type: *No Product type* /    Admitting Diagnosis: CVA  Admit Date/Time:  10/29/2017  5:23 PM Admission Comments: No comment available   Primary Diagnosis:  <principal problem not specified> Principal Problem: <principal problem not specified>  Patient Active Problem List   Diagnosis Date Noted  . Dysphagia, post-stroke   . Essential hypertension   . Embolic cerebral infarction ([place]) 10/29/2017  . Dementia without behavioral disturbance   . Benign essential HTN   . Diastolic dysfunction   . Hypothyroidism   . Hypokalemia   . Altered mental status   . Pressure injury of skin 10/26/2017  . Loss of weight 10/26/2017  . Elevated troponin I level 10/26/2017  . Leukocytosis 10/26/2017  . Disheveled appearance 10/26/2017  . Multiple leg contusions, unspecified laterality, sequela 10/26/2017  . Cognitive impairment 10/26/2017  . Scalp hematoma, subsequent encounter 10/26/2017  . Head injury 10/26/2017  . Fall at home, sequela 10/26/2017  . Right BBB/left ant fasc block 10/26/2017  . Acute confusional state 10/26/2017  . Dizziness and giddiness 10/26/2017  . Cerebral infarction [facility]), right occipital lobe 10/26/2017  . Elevated AST (SGOT) 10/26/2017  . Shoulder injury, right, subsequent encounter 10/26/2017  . Paranasal sinus disease 10/26/2017  . Maxillary sinusitis 10/26/2017  . Ethmoid sinusitis 10/26/2017  . Sphenoid sinusitis 10/26/2017  . Rhabdomyolysis 10/25/2017  . Benign hypertension 04/01/2013  . Neuropathy 03/31/2013    Expected Discharge Date: Expected Discharge Date: (SNF)  Team Members Present: Physician leading conference: Dr. Alger Simons Social  Worker Present: Lennart Pall, LCSW Nurse Present: Rayetta Humphrey, RN PT Present: Lavone Nian, PT OT Present: Benay Pillow, OT SLP Present: Weston Anna, SLP PPS Coordinator present : Daiva Nakayama, RN, CRRN     Current Status/Progress Goal Weekly Team Focus  Medical   right occipital infarct, dementia, htn, irritable behavior  improve activity tolerance/partcipation   behavior, bp, nutrition   Bowel/Bladder   incontinent of bladder continent of bowel LBM 10-31-17  continent of bowel and bladder maintain regular bowel pattern  Assist with tolieting needs timed toliet q2   Swallow/Nutrition/ Hydration   Dys. 3 textures with thin liquids, Supervision  Supervision  Tolerance and possible upgrade    ADL's   No bathing assessed due to pt refusal, Mod A UB dressing, Max A LB dressing, Total A toileting, Toilet transfers have not been recorded in [place]. Progress limited by unwillingness to participate  Min A overall  Willing participation, balance, cognitive remediation, pt/family education, functional transfers, activity tolerance, general strengthening    Mobility   min assist transfers, max assist gait with rail, inconsistent willingness to participate  min assist overall  transfers, gait, NMR   Communication             Safety/Cognition/ Behavioral Observations  Max-Total A  Min A  participation, problem solving, awareness, recall    Pain   no c/o pain  no c/o pain   Assess pain q shift and pain    Skin   abrasion to right leg  no new skin breakdown   Assess skin q shift and prn    Rehab Goals Patient on target to meet rehab  goals: No Rehab Goals Revised: Pt continues to be very resistant to any therapies and not expected to reach goals. *See Care Plan and progress notes for long and short-term goals.     Barriers to Discharge  Current Status/Progress Possible Resolutions Date Resolved   Physician    Medical stability        may need to consider less intensive venue of care, family  ed to provide for anticipated care needs      Nursing                  PT  Behavior;Decreased caregiver support  unsure if pt's wife will be able to provide 24 hr assist upon d/c              OT                  SLP                SW Decreased caregiver support;Behavior              Discharge Planning/Teaching Needs:  Spouse reports plan is for pt to d/c home and she is pursuing private duty care to cover 24/7 caregiver.        Team Discussion:  Pt making no progress with therapies due to his behavior.  Can range in LOC min to max assistance.  incont of b/b.  SW to follow up with wife about need to d/c home vs begin SNF placement.  Revisions to Treatment Plan:  Do not anticipate pt able to reach min assist goals due to behavior and cognition.    Continued Need for Acute Rehabilitation Level of Care: The patient requires daily medical management by a physician with specialized training in physical medicine and rehabilitation for the following conditions: Daily direction of a multidisciplinary physical rehabilitation program to ensure safe treatment while eliciting the highest outcome that is of practical value to the patient.: Yes Daily medical management of patient stability for increased activity during participation in an intensive rehabilitation regime.: Yes Daily analysis of laboratory values and/or radiology reports with any subsequent need for medication adjustment of medical intervention for : Neurological problems;Mood/behavior problems  [name] 11/05/2017, 10:26 AM

## 2017-11-05 NOTE — Progress Notes (Signed)
Physical Therapy Session Note  Patient Details  Name: Ian [name] MRN: [id number] Date of Birth: 11/22/1925  Today's Date: 11/05/2017   Short Term Goals: Week 1:  PT Short Term Goal 1 (Week 1): Pt will complete bed<>w/c with mod assist +1. PT Short Term Goal 2 (Week 1): Pt will ambulate 25 ft with max assist +1. PT Short Term Goal 3 (Week 1): Pt will initiate stair training.   Skilled Therapeutic Interventions/Progress Updates: Pt received seated in w/c, adamantly refuses to participate in therapy. Reports "I already did my therapy for the day, I'm done, leave me alone". Attempted to explain difference between PT/OT/SLP and goals of PT session, encouraged participation including ambulation training as pt reported to OT earlier he wanted to walk. Unwilling to consider participation with multiple attempts to educate and encourage. Pt continues to refuse, becomes frustrated with therapy staff. Remained in w/c, quick release belt intact and all needs in reach. Continue per POC as able.     Therapy Documentation Precautions:  Precautions Precautions: Fall Restrictions Weight Bearing Restrictions: No General: PT Amount of Missed Time (min): 60 Minutes PT Missed Treatment Reason: Patient unwilling to participate   See Function Navigator for Current Functional Status.   Therapy/Group: Individual Therapy  Luberta Mutter 11/05/2017, 3:26 PM

## 2017-11-05 NOTE — Progress Notes (Signed)
[place] PHYSICAL MEDICINE & REHABILITATION     PROGRESS NOTE    Subjective/Complaints: Resting when I arrived. Not happy about being awoken  ROS: Limited due to cognitive/behavioral   Objective: Vital Signs: Blood pressure 127/74, pulse 85, temperature 97.8 F (36.6 C), temperature source Oral, resp. rate 18, height 5\' 11"  (1.803 m), weight 71.2 kg (156 lb 15.5 oz), SpO2 98 %. No results found. No results for input(s): WBC, HGB, HCT, PLT in the last 72 hours. No results for input(s): NA, K, CL, GLUCOSE, BUN, CREATININE, CALCIUM in the last 72 hours.  Invalid input(s): CO CBG (last 3)  No results for input(s): GLUCAP in the last 72 hours.  Wt Readings from Last 3 Encounters:  11/05/17 71.2 kg (156 lb 15.5 oz)  10/29/17 78.2 kg (172 lb 6.4 oz)  02/01/16 79.8 kg (176 lb)    Physical Exam:  Constitutional: No distress . Vital signs reviewed. HEENT: EOMI, oral membranes moist Cardiovascular: RRR without murmur. No JVD    Respiratory: CTA Bilaterally without wheezes or rales. Normal effort    GI: BS +, non-tender, non-distended   Skin.Warm and dry with scattered hematomas improving Musculoskeletal:No edema or tenderness in extremities Neurological: He isalert. Limited insight Motor: LUE: 5/5 proximal distal LLE: HF, 4/5 proximal distal RUE: 4-/5 proximal to distal RLE: HF, KE 2-3/5, ADF 0-1?/5/PF ---doesn't  participate consistently Psych: Irritable, avoids making eye contact  Assessment/Plan: 1. Functional deficits and right hemiparesis secondary to right occipital infarct/rhabdomyolysis which require 3+ hours per day of interdisciplinary therapy in a comprehensive inpatient rehab setting. Physiatrist is providing close team supervision and 24 hour management of active medical problems listed below. Physiatrist and rehab team continue to assess barriers to discharge/monitor patient progress toward functional and medical goals.  Function:  Bathing Bathing  position Bathing activity did not occur: Refused    Bathing parts      Bathing assist        Upper Body Dressing/Undressing Upper body dressing Upper body dressing/undressing activity did not occur: Refused What is the patient wearing?: Pull over shirt/dress     Pull over shirt/dress - Perfomed by patient: Thread/unthread left sleeve, Thread/unthread right sleeve Pull over shirt/dress - Perfomed by helper: Put head through opening, Pull shirt over trunk        Upper body assist        Lower Body Dressing/Undressing Lower body dressing Lower body dressing/undressing activity did not occur: Refused What is the patient wearing?: Pants, Shoes, Non-skid slipper socks     Pants- Performed by patient: Thread/unthread left pants leg Pants- Performed by helper: Pull pants up/down, Thread/unthread right pants leg   Non-skid slipper socks- Performed by helper: Don/doff right sock, Don/doff left sock     Shoes - Performed by patient: Don/doff left shoe, Fasten right, Fasten left Shoes - Performed by helper: Don/doff right shoe          Lower body assist        Toileting Toileting Toileting activity did not occur: No continent bowel/bladder event   Toileting steps completed by helper: Adjust clothing prior to toileting, Performs perineal hygiene, Adjust clothing after toileting Toileting Assistive Devices: Grab bar or rail  Toileting assist Assist level: More than reasonable time   Transfers Chair/bed transfer Chair/bed transfer activity did not occur: Refused Chair/bed transfer method: Stand pivot Chair/bed transfer assist level: Touching or steadying assistance (Pt > 75%) Chair/bed transfer assistive device: Medical sales representative Ambulation activity did not occur: Safety/medical  concerns   Max distance: 30 ft Assist level: 2 helpers(min assist + w/c follow for safety)   Wheelchair Wheelchair activity did not occur: Safety/medical concerns Type: Manual Max  wheelchair distance: 100 ft Assist Level: Supervision or verbal cues  Cognition Comprehension Comprehension assist level: Understands basic 50 - 74% of the time/ requires cueing 25 - 49% of the time  Expression Expression assist level: Expresses basic 50 - 74% of the time/requires cueing 25 - 49% of the time. Needs to repeat parts of sentences.  Social Interaction Social Interaction assist level: Interacts appropriately 50 - 74% of the time - May be physically or verbally inappropriate.  Problem Solving Problem solving assist level: Solves basic 25 - 49% of the time - needs direction more than half the time to initiate, plan or complete simple activities  Memory Memory assist level: Recognizes or recalls less than 25% of the time/requires cueing greater than 75% of the time   Medical Problem List and Plan: 1.  Decreased functional mobility, possible syncopal event with fall secondary to right occipital embolic infarction as well as rhabdomyolysis.    -Aspirin and Plavix 3 weeks then Plavix alone. Plan for 30 day monitor on discharge     -will need SNF.    -Tele-sitter for safety 2.  DVT Prophylaxis/Anticoagulation: SCDs. Monitor for any signs of DVT   -Dopplers negative 3. Pain Management: Tylenol as needed 4. Mood/dementia. Discuss baseline with wife.  5. Neuropsych: This patient is ?fully capable of making decisions on his own behalf. 6. Skin/Wound Care: Routine skin checks 7. Fluids/Electrolytes/Nutrition: A little better yesterday   Team to encourage p.o. Intake   -dietician follow up   -Follow-up lab work on Thursday (ordered) 8. Hypertension. Norvasc 5 mg daily.     Controlled at present 9. Hypothyroidism. Synthroid. TSH 9.181. Free T4 0.77. Continue current supplement for now 10. Dysphagia. Follow-up speech therapy.    Continue D3 thin liquids, advance diet as tolerated.   -intake borderline as above 13. Diastolic dysfunction: no current signs/symptoms of fluid overload  Filed  Weights   11/02/17 0447 11/04/17 0347 11/05/17 0153  Weight: 78 kg (171 lb 15.3 oz) 71.1 kg (156 lb 12 oz) 71.2 kg (156 lb 15.5 oz)    Stable 3/13---   LOS (Days) 7 A FACE TO FACE EVALUATION WAS PERFORMED  Meredith Staggers, MD 11/05/2017 8:42 AM

## 2017-11-05 NOTE — Progress Notes (Signed)
Occupational Therapy Session Note  Patient Details  Name: Ian [name] MRN: [id number] Date of Birth: 19-May-1926  Today's Date: 11/05/2017 OT Individual Time: 2585-2778 OT Individual Time Calculation (min): 54 min    Short Term Goals: Week 1:  OT Short Term Goal 1 (Week 1): Pt will perform toilet transfer with max A in order to decrease level of assistance for functional transfer. OT Short Term Goal 2 (Week 1): Pt will perform UB dressing with mod A. OT Short Term Goal 3 (Week 1): Pt will perform LB dressing with max A.   Skilled Therapeutic Interventions/Progress Updates:    Pt received at RN station without c/o pain this session. Pt is agreeable to OT intervention with encouragement. OT assisted pt to day room via wheelchair and pt transferred with stand pivot transfer and min A to [place]. Pt engaged in task for 15 minutes to address B UE and LE strengthening and endurance. Pt required 3 rest breaks of 30 seconds or less to complete task. Pt unable to navigate/path find back to room without total A this session. Pt engaged in B UE strengthening exercises with use of 4lb weighted ball. Pt returning demonstrations with min demonstrational cues for proper technique. Pt becomes more frustrated when given verbal cues throughout session. Pt returned to RN station with chair alarm activated and quick release belt donned for safety.  Therapy Documentation Precautions:  Precautions Precautions: Fall Restrictions Weight Bearing Restrictions: No    Pain: Pain Assessment Pain Assessment: No/denies pain Pain Score: 0-No pain  See Function Navigator for Current Functional Status.   Therapy/Group: Individual Therapy  Gypsy Decant 11/05/2017, 11:20 AM

## 2017-11-05 NOTE — Progress Notes (Addendum)
Social Work Patient ID: Ian [name], male   DOB: Jul 28, 1926, 84 y.o.   MRN: [id number]   Met privately with pt's wife today to review team conference and discuss concerns about pt's limited progress/ participation.  She states she fully agrees with team and is frustrated with pt but does not feel she could take him home at this level of care.  She states she cannot afford to hire private duty caregivers and ask to change the plan to SNF.   Wife and I then met with pt to discuss issues with him and explain need for SNF.  Pt awakens easily when we enter, however, yells at [place] that we "never let me sleep!"  Pt lying in bed and makes no eye contact as I review conference info and recommendation of SNF for d/c plan.  Pt very passively agrees to this plan, however, he states he feels he will recover better at home.  Wife encouraging his to take advantage of our program while I work on SNF plan.  Pt does not respond.  Will begin bed search.  Nomi Rudnicki, LCSW

## 2017-11-05 NOTE — Plan of Care (Signed)
  Consults RH STROKE PATIENT EDUCATION Description See Patient Education module for education specifics  11/05/2017 0358 - Not Progressing by Etheleen Nicks, RN   RH BOWEL ELIMINATION RH STG MANAGE BOWEL WITH ASSISTANCE Description STG Manage Bowel with mod I Assistance.  11/05/2017 0358 - Not Progressing by Etheleen Nicks, RN RH STG MANAGE BOWEL W/MEDICATION W/ASSISTANCE Description STG Manage Bowel with Medication with mod I Assistance.  11/05/2017 0358 - Not Progressing by Etheleen Nicks, RN   RH SKIN INTEGRITY RH STG SKIN FREE OF INFECTION/BREAKDOWN Description Patients skin will remain free from further infection or breakdown with min assist.   11/05/2017 0358 - Not Progressing by Etheleen Nicks, RN RH STG MAINTAIN SKIN INTEGRITY WITH ASSISTANCE Description STG Maintain Skin Integrity With min Assistance.  11/05/2017 0358 - Not Progressing by Etheleen Nicks, RN   RH SAFETY RH STG ADHERE TO SAFETY PRECAUTIONS W/ASSISTANCE/DEVICE Description STG Adhere to Safety Precautions With min Assistance/Device.  11/05/2017 0358 - Not Progressing by Etheleen Nicks, RN  Pt requires total assist

## 2017-11-05 NOTE — Progress Notes (Signed)
Speech Language Pathology Daily Session Note  Patient Details  Name: Ian [name] MRN: [id number] Date of Birth: 1926/06/14  Today's Date: 11/05/2017  Session 1: SLP Individual Time: 6378-5885 SLP Individual Time Calculation (min): 25 min   Session 2: SLP Individual Time: 0277-4128 SLP Individual Time Calculation (min): 15 min Missed Time: 45 minutes, Refusal   Short Term Goals: Week 1: SLP Short Term Goal 1 (Week 1): Patient will consume current diet with minimal overt s/s of aspiration with Mod A verbal cues for use of small bites/sips and a slow rate of self-feeding.  SLP Short Term Goal 2 (Week 1): Patient will recall new, daily information with Mod A multimodal cues.  SLP Short Term Goal 3 (Week 1): Patient will demonstrate functional problem solving for basic and familiar tasks with Mod A multimodal cues.  SLP Short Term Goal 4 (Week 1): Patient will identify 2 cognitive deficits with Max A multimodal cues.  SLP Short Term Goal 5 (Week 1): Patient will demonstrate selective attention in a mildly distracting enviornment for 30 minutes with Mod A verbal cues for redirection.   Skilled Therapeutic Interventions:  Session 1: Skilled treatment session focused on cognitive goals. Patient requested to use the bathroom but became verbally agitated when clinician attempting to help patient with clothing management and transfer for safety. Therefore, throughout tasks, patient required Max A multimodal cues for problem solving. Patient appeared to demonstrate increased confusion and asking for his overnight bag when leaving the room but was redirected. Patient also required Max-Total A multimodal cues to utilize external aids to recall/anticpate new, daily information/therapy activities. Patient left upright in wheelchair at RN station with quick release belt in place and chair alarm on. Continue with current plan of care.    Session 2: Skilled treatment session focused on cognitive goals. Upon  arrival, patient was awake while supine in bed but became verbally agitated with clinician and his wife when asked to get out of bed to participate in therapies. Clinician left patient and reported she would reattempt in 15 minutes. However, ~5 minutes later, patient attempting to get OOB by himself and required Max-Total A multimodal cues for safety awareness with transfer. When OOB, patient declined his lunch meal along with several other options provided by clinician. Therefore, patient left upright at RN station with suitcase in lap and quick release belt in place. Continue with current plan of care.      Function:  Cognition Comprehension Comprehension assist level: Understands basic 50 - 74% of the time/ requires cueing 25 - 49% of the time  Expression   Expression assist level: Expresses basic 50 - 74% of the time/requires cueing 25 - 49% of the time. Needs to repeat parts of sentences.  Social Interaction Social Interaction assist level: Interacts appropriately 50 - 74% of the time - May be physically or verbally inappropriate.  Problem Solving Problem solving assist level: Solves basic 25 - 49% of the time - needs direction more than half the time to initiate, plan or complete simple activities  Memory Memory assist level: Recognizes or recalls less than 25% of the time/requires cueing greater than 75% of the time    Pain Pain Assessment Pain Assessment: No/denies pain Pain Score: 0-No pain  Therapy/Group: Individual Therapy  Ian [name] 11/05/2017, 11:05 AM

## 2017-11-06 ENCOUNTER — Inpatient Hospital Stay (HOSPITAL_COMMUNITY): Payer: Medicare Other | Admitting: Physical Therapy

## 2017-11-06 ENCOUNTER — Inpatient Hospital Stay (HOSPITAL_COMMUNITY): Payer: Medicare Other | Admitting: Occupational Therapy

## 2017-11-06 ENCOUNTER — Inpatient Hospital Stay (HOSPITAL_COMMUNITY): Payer: Medicare Other | Admitting: Speech Pathology

## 2017-11-06 LAB — BASIC METABOLIC PANEL
Anion gap: 8 (ref 5–15)
BUN: 15 mg/dL (ref 6–20)
CO2: 24 mmol/L (ref 22–32)
Calcium: 10 mg/dL (ref 8.9–10.3)
Chloride: 103 mmol/L (ref 101–111)
Creatinine, Ser: 0.83 mg/dL (ref 0.61–1.24)
GFR calc Af Amer: >60 mL/min (ref >60)
GFR calc non Af Amer: >60 mL/min (ref >60)
Glucose, Bld: 94 mg/dL (ref 65–99)
Potassium: 3.2 mmol/L — ABNORMAL LOW (ref 3.5–5.1)
Sodium: 135 mmol/L (ref 135–145)

## 2017-11-06 LAB — CBC
HCT: 35.4 % — ABNORMAL LOW (ref 39.0–52.0)
Hemoglobin: 12.1 g/dL — ABNORMAL LOW (ref 13.0–17.0)
MCH: 33.2 pg (ref 26.0–34.0)
MCHC: 34.2 g/dL (ref 30.0–36.0)
MCV: 97.3 fL (ref 78.0–100.0)
Platelets: 323 10*3/uL (ref 150–400)
RBC: 3.64 MIL/uL — ABNORMAL LOW (ref 4.22–5.81)
RDW: 12.8 % (ref 11.5–15.5)
WBC: 13.3 10*3/uL — ABNORMAL HIGH (ref 4.0–10.5)

## 2017-11-06 MED ORDER — POTASSIUM CHLORIDE CRYS ER 20 MEQ PO TBCR
20.0000 meq | EXTENDED_RELEASE_TABLET | Freq: Every day | ORAL | Status: DC
  Administered 2017-11-06 – 2017-11-08 (×3): 20 meq via ORAL
  Filled 2017-11-06 (×3): qty 1

## 2017-11-06 NOTE — Progress Notes (Signed)
Physical Therapy Session Note  Patient Details  Name: Ian [name] MRN: [id number] Date of Birth: 08/11/1926  Today's Date: 11/06/2017 PT Individual Time: 2831-5176 PT Individual Time Calculation (min): 24 min   Short Term Goals: Week 2:  PT Short Term Goal 1 (Week 2): Pt will ambulate 50 ft with max assist +1.  PT Short Term Goal 2 (Week 2): Pt will consistently complete bed<>w/c with min assist +1.   Skilled Therapeutic Interventions/Progress Updates:  Pt received in bed & agreeable to tx. No c/o pain reported. Pt transferred OOB with supervision & hospital bed features. Pt donned shoes sitting EOB with supervision. Attempted gait with RW with therapist providing various cuing for technique and assisting with RLE advancement but pt becoming fearful & frustrated and abruptly sitting multiple times with very poor awareness & therapist assisting him to EOB. Pt transferred bed>w/c via stand pivot with min assist. Pt utilized cybex kinetron from w/c level with task focusing on BLE strengthening & NMR. At end of session pt left sitting in w/c in room with QRB & chair alarm donned, all needs within reach.   Therapy Documentation Precautions:  Precautions Precautions: Fall Restrictions Weight Bearing Restrictions: No   See Function Navigator for Current Functional Status.   Therapy/Group: Individual Therapy  Waunita Schooner 11/06/2017, 4:43 PM

## 2017-11-06 NOTE — NC FL2 (Signed)
[place] LEVEL OF CARE SCREENING TOOL     IDENTIFICATION  Patient Name: Ian [name] Birthdate: Sep 06, 1925 Sex: male Admission Date (Current Location): 10/29/2017  [facility] [place] and [place] Number:  Herbalist and Address:  [facility], [place] [address], [place], [place] [address]      Provider Number: [id number]  Attending Physician Name and Address:  Meredith Staggers, MD  Relative Name and Phone Number:       Current Level of Care: Other (Comment)(Acute Inpatient Rehab) Recommended Level of Care: [employer] Prior Approval Number:    Date Approved/Denied:   PASRR Number: [id number]  Discharge Plan: SNF    Current Diagnoses: Patient Active Problem List   Diagnosis Date Noted  . Dysphagia, post-stroke   . Essential hypertension   . Embolic cerebral infarction ([place]) 10/29/2017  . Dementia without behavioral disturbance   . Benign essential HTN   . Diastolic dysfunction   . Hypothyroidism   . Hypokalemia   . Altered mental status   . Pressure injury of skin 10/26/2017  . Loss of weight 10/26/2017  . Elevated troponin I level 10/26/2017  . Leukocytosis 10/26/2017  . Disheveled appearance 10/26/2017  . Multiple leg contusions, unspecified laterality, sequela 10/26/2017  . Cognitive impairment 10/26/2017  . Scalp hematoma, subsequent encounter 10/26/2017  . Head injury 10/26/2017  . Fall at home, sequela 10/26/2017  . Right BBB/left ant fasc block 10/26/2017  . Acute confusional state 10/26/2017  . Dizziness and giddiness 10/26/2017  . Cerebral infarction [facility]), right occipital lobe 10/26/2017  . Elevated AST (SGOT) 10/26/2017  . Shoulder injury, right, subsequent encounter 10/26/2017  . Paranasal sinus disease 10/26/2017  . Maxillary sinusitis 10/26/2017  . Ethmoid sinusitis 10/26/2017  . Sphenoid sinusitis 10/26/2017  . Rhabdomyolysis 10/25/2017  . Benign hypertension 04/01/2013  . Neuropathy 03/31/2013     Orientation RESPIRATION BLADDER Height & Weight     Self  Normal Incontinent Weight: 72 kg (158 lb 11.7 oz) Height:  5\' 11"  (180.3 cm)  BEHAVIORAL SYMPTOMS/MOOD NEUROLOGICAL BOWEL NUTRITION STATUS  Other (Comment)(would describe him as "ornery" but not abusive)   Incontinent Diet(D3, thin liquids and supervision)  AMBULATORY STATUS COMMUNICATION OF NEEDS Skin   Extensive Assist Verbally Normal                       Personal Care Assistance Level of Assistance  Feeding, Dressing, Total care, Bathing Bathing Assistance: Maximum assistance Feeding assistance: Limited assistance Dressing Assistance: Maximum assistance Total Care Assistance: Maximum assistance   Functional Limitations Info  Hearing          SPECIAL CARE FACTORS FREQUENCY  PT (By licensed PT), OT (By licensed OT), Speech therapy(we are using a quick release belt plus bed and chair alarm, however, these are not considered restraints for the hospital;  could try a hi-lo bed)     PT Frequency: 5x/wk OT Frequency: 5x/wk     Speech Therapy Frequency: 5x/wk      Contractures Contractures Info: Not present    Additional Factors Info  Code Status, Allergies Code Status Info: Full Allergies Info: NKDA           Current Medications (11/06/2017):  This is the current hospital active medication list Current Facility-Administered Medications  Medication Dose Route Frequency Provider Last Rate Last Dose  . ALPRAZolam Duanne Moron) tablet 0.25 mg  0.25 mg Oral PRN Cathlyn Parsons, PA-C   0.25 mg at 11/01/17 2116  .  amLODipine (NORVASC) tablet 10 mg  10 mg Oral Daily Cathlyn Parsons, PA-C   10 mg at 11/06/17 5170  . aspirin chewable tablet 81 mg  81 mg Oral Daily Cathlyn Parsons, PA-C   81 mg at 11/06/17 0174  . atorvastatin (LIPITOR) tablet 40 mg  40 mg Oral q1800 Cathlyn Parsons, PA-C   40 mg at 11/05/17 1729  . bisacodyl (DULCOLAX) suppository 10 mg  10 mg Rectal Daily PRN Angiulli, Lavon Paganini, PA-C       . clopidogrel (PLAVIX) tablet 75 mg  75 mg Oral Daily Cathlyn Parsons, PA-C   75 mg at 11/06/17 0727  . fluticasone (FLONASE) 50 MCG/ACT nasal spray 2 spray  2 spray Each Nare Daily Cathlyn Parsons, PA-C   2 spray at 11/05/17 0755  . levothyroxine (SYNTHROID, LEVOTHROID) tablet 25 mcg  25 mcg Oral QAC breakfast Cathlyn Parsons, PA-C   25 mcg at 11/06/17 0805  . nitroGLYCERIN (NITROSTAT) SL tablet 0.4 mg  0.4 mg Sublingual Q5 Min x 3 PRN Angiulli, Lavon Paganini, PA-C      . ondansetron [facility]) tablet 4 mg  4 mg Oral Q6H PRN Angiulli, Lavon Paganini, PA-C       Or  . ondansetron (ZOFRAN) injection 4 mg  4 mg Intravenous Q6H PRN Angiulli, Lavon Paganini, PA-C      . potassium chloride SA (K-DUR,KLOR-CON) CR tablet 20 mEq  20 mEq Oral Daily Meredith Staggers, MD   20 mEq at 11/06/17 0856  . senna-docusate (Senokot-S) tablet 1 tablet  1 tablet Oral BID Cathlyn Parsons, PA-C   1 tablet at 11/06/17 9449  . sorbitol 70 % solution 30 mL  30 mL Oral Daily PRN Cathlyn Parsons, PA-C   30 mL at 11/04/17 2102     Discharge Medications: Please see discharge summary for a list of discharge medications.  Relevant Imaging Results:  Relevant Lab Results:   Additional Information SS# [id number]  Lennart Pall, LCSW

## 2017-11-06 NOTE — Progress Notes (Signed)
[place] PHYSICAL MEDICINE & REHABILITATION     PROGRESS NOTE    Subjective/Complaints: No new issues. Remains irritable. Limited participation in therapies  ROS: Limited due to cognitive/behavioral   Objective: Vital Signs: Blood pressure 130/74, pulse 80, temperature 98.2 F (36.8 C), temperature source Oral, resp. rate 18, height 5\' 11"  (1.803 m), weight 72 kg (158 lb 11.7 oz), SpO2 98 %. No results found. Recent Labs    11/06/17 0521  WBC 13.3*  HGB 12.1*  HCT 35.4*  PLT 323   Recent Labs    11/06/17 0521  NA 135  K 3.2*  CL 103  GLUCOSE 94  BUN 15  CREATININE 0.83  CALCIUM 10.0   CBG (last 3)  No results for input(s): GLUCAP in the last 72 hours.  Wt Readings from Last 3 Encounters:  11/06/17 72 kg (158 lb 11.7 oz)  10/29/17 78.2 kg (172 lb 6.4 oz)  02/01/16 79.8 kg (176 lb)    Physical Exam:  Constitutional: No distress . Vital signs reviewed. HEENT: EOMI, oral membranes moist Cardiovascular: RRR without murmur. No JVD    Respiratory: CTA Bilaterally without wheezes or rales. Normal effort    GI: BS +, non-tender, non-distended   Skin.Warm and dry with scattered hematomas improving Musculoskeletal:No edema or tenderness in extremities Neurological: He isalert. Poor insight Motor: LUE: 5/5 proximal distal LLE: HF, 4/5 proximal distal RUE: 4-/5 proximal to distal RLE: HF, KE 2-3/5, ADF 0-1?/5/PF ---inconsistent Psych: Irritable, avoids making eye contact  Assessment/Plan: 1. Functional deficits and right hemiparesis secondary to right occipital infarct/rhabdomyolysis which require 3+ hours per day of interdisciplinary therapy in a comprehensive inpatient rehab setting.  Change to QD. Physiatrist is providing close team supervision and 24 hour management of active medical problems listed below. Physiatrist and rehab team continue to assess barriers to discharge/monitor patient progress toward functional and medical  goals.  Function:  Bathing Bathing position Bathing activity did not occur: Refused    Bathing parts      Bathing assist        Upper Body Dressing/Undressing Upper body dressing Upper body dressing/undressing activity did not occur: Refused What is the patient wearing?: Pull over shirt/dress     Pull over shirt/dress - Perfomed by patient: Thread/unthread left sleeve, Thread/unthread right sleeve Pull over shirt/dress - Perfomed by helper: Put head through opening, Pull shirt over trunk        Upper body assist        Lower Body Dressing/Undressing Lower body dressing Lower body dressing/undressing activity did not occur: Refused What is the patient wearing?: Pants, Shoes, Non-skid slipper socks     Pants- Performed by patient: Thread/unthread left pants leg Pants- Performed by helper: Pull pants up/down, Thread/unthread right pants leg   Non-skid slipper socks- Performed by helper: Don/doff right sock, Don/doff left sock     Shoes - Performed by patient: Don/doff left shoe, Fasten right, Fasten left Shoes - Performed by helper: Don/doff right shoe          Lower body assist        Toileting Toileting Toileting activity did not occur: No continent bowel/bladder event   Toileting steps completed by helper: Adjust clothing prior to toileting, Performs perineal hygiene, Adjust clothing after toileting Toileting Assistive Devices: Grab bar or rail  Toileting assist Assist level: More than reasonable time   Transfers Chair/bed transfer Chair/bed transfer activity did not occur: Refused Chair/bed transfer method: Stand pivot Chair/bed transfer assist level: Touching or steadying assistance (Pt >  75%) Chair/bed transfer assistive device: Financial risk analyst activity did not occur: Safety/medical concerns   Max distance: 30 ft Assist level: 2 helpers(min assist + w/c follow for safety)   Wheelchair Wheelchair activity did not occur:  Safety/medical concerns Type: Manual Max wheelchair distance: 100 ft Assist Level: Supervision or verbal cues  Cognition Comprehension Comprehension assist level: Understands basic 50 - 74% of the time/ requires cueing 25 - 49% of the time  Expression Expression assist level: Expresses basic 50 - 74% of the time/requires cueing 25 - 49% of the time. Needs to repeat parts of sentences.  Social Interaction Social Interaction assist level: Interacts appropriately 50 - 74% of the time - May be physically or verbally inappropriate.  Problem Solving Problem solving assist level: Solves basic 25 - 49% of the time - needs direction more than half the time to initiate, plan or complete simple activities  Memory Memory assist level: Recognizes or recalls less than 25% of the time/requires cueing greater than 75% of the time   Medical Problem List and Plan: 1.  Decreased functional mobility, possible syncopal event with fall secondary to right occipital embolic infarction as well as rhabdomyolysis.    -Aspirin and Plavix 3 weeks then Plavix alone. Plan for 30 day monitor on discharge     -will need SNF.    -Tele-sitter for safety    -will change therapy to QD 2.  DVT Prophylaxis/Anticoagulation: SCDs. Monitor for any signs of DVT   -Dopplers negative 3. Pain Management: Tylenol as needed 4. Mood/dementia. Discuss baseline with wife.  5. Neuropsych: This patient is ?fully capable of making decisions on his own behalf. 6. Skin/Wound Care: Routine skin checks 7. Fluids/Electrolytes/Nutrition:    Team to encourage p.o. Intake   -dietician follow up   -I personally reviewed the patient's labs today.  Replete potassium 8. Hypertension. Norvasc 5 mg daily.     Controlled at present 3/14 9. Hypothyroidism. Synthroid. TSH 9.181. Free T4 0.77. Continue current supplement for now 10. Dysphagia. Follow-up speech therapy.    Continue D3 thin liquids, advance diet as tolerated.   -intake borderline  13.  Diastolic dysfunction: no current signs/symptoms of fluid overload  Filed Weights   11/04/17 0347 11/05/17 0153 11/06/17 0500  Weight: 71.1 kg (156 lb 12 oz) 71.2 kg (156 lb 15.5 oz) 72 kg (158 lb 11.7 oz)    Stable 3/14--- 14. Leukocytosis: 13k. No s/s of infection   -will recheck tomorrow   LOS (Days) [place] T, MD 11/06/2017 8:18 AM

## 2017-11-06 NOTE — Plan of Care (Signed)
Pt's plan of care adjusted to QD after speaking with care team and discussed with MD in team conference as pt currently unable to tolerate current therapy schedule with OT, PT, and SLP.   Weston Anna, [place], [place]

## 2017-11-06 NOTE — Progress Notes (Signed)
Physical Therapy Weekly Progress Note  Patient Details  Name: Callan Norden MRN: [id number] Date of Birth: August 15, 1926  Beginning of progress report period: October 30, 2017 End of progress report period: November 06, 2017  Today's Date: 11/06/2017   Patient has met 2 of 3 short term goals.  Pt has demonstrated inconsistent progress 2/2 poor motivation & willingness to participate in PT sessions. Pt is also limited by impaired cognition. Pt is able to complete bed<>chair transfers with fluctuating min<>max assist and can ambulate with rail with min assist +w/c follow for safety and when willing, with RW with max assist. Pt with poor neuromuscular control and coordination RLE, RUE and very poor standing balance 2/2 posterior lean. Pt's wife has been present intermittently to observe sessions & is aware of pt's limited progress. Therapist has educated pt's wife on need for 24 hr assistance upon d/c.   Patient continues to demonstrate the following deficits muscle weakness, decreased cardiorespiratory endurance, decreased coordination, decreased initiation, decreased attention, decreased awareness, decreased problem solving, decreased safety awareness, decreased memory and delayed processing, and decreased standing balance, decreased postural control and decreased balance strategies and therefore will continue to benefit from skilled PT intervention to increase functional independence with mobility.  Patient not progressing toward long term goals.  See goal revision..  Plan of care revisions: ambulation goals downgraded to max assist with LRAD, household distances.  PT Short Term Goals Week 1:  PT Short Term Goal 1 (Week 1): Pt will complete bed<>w/c with mod assist +1. PT Short Term Goal 1 - Progress (Week 1): Progressing toward goal(inconsistently met; pt requires min<>max assist depending on cognition & motivation) PT Short Term Goal 2 (Week 1): Pt will ambulate 25 ft with max assist +1. PT Short Term  Goal 2 - Progress (Week 1): Met PT Short Term Goal 3 (Week 1): Pt will initiate stair training.  PT Short Term Goal 3 - Progress (Week 1): Met Week 2:  PT Short Term Goal 1 (Week 2): Pt will ambulate 50 ft with max assist +1.  PT Short Term Goal 2 (Week 2): Pt will consistently complete bed<>w/c with min assist +1.    Therapy Documentation Precautions:  Precautions Precautions: Fall Restrictions Weight Bearing Restrictions: No   See Function Navigator for Current Functional Status.  Therapy/Group: Individual Therapy  Waunita Schooner 11/06/2017, 8:37 AM

## 2017-11-06 NOTE — Progress Notes (Signed)
Physical Therapy Session Note  Patient Details  Name: Ian [name] MRN: [id number] Date of Birth: 09/14/1925  Today's Date: 11/06/2017 PT Individual Time: 3887-1959 PT Individual Time Calculation (min): 32 min   Short Term Goals: Week 2:  PT Short Term Goal 1 (Week 2): Pt will ambulate 50 ft with max assist +1.  PT Short Term Goal 2 (Week 2): Pt will consistently complete bed<>w/c with min assist +1.   Skilled Therapeutic Interventions/Progress Updates:  Pt received in w/c & reluctantly agreeable to tx. Pt irritable throughout session requiring max encouragement and education regarding participation & tasks. Pt propels w/c throughout unit with BUE & intermittent use of BLE (L>R) with frequent rest breaks. Pt completes car transfer at equal height with min assist but impaired safety awareness. Pt completes w/c<>couch transfer with mod assist and poor eccentric control & awareness of need to pivot. Attempted to assist pt with ambulation with RW but pt reports it bothers his R hip. Pt would transfer to standing but unwilling to take steps x multiple attempts. Pt irritable throughout session. Pt left in room with QRB & chair alarm donned & all needs within reach.  Therapy Documentation Precautions:  Precautions Precautions: Fall Restrictions Weight Bearing Restrictions: No General: PT Amount of Missed Time (min): 28 Minutes PT Missed Treatment Reason: Patient unwilling to participate   See Function Navigator for Current Functional Status.   Therapy/Group: Individual Therapy  Waunita Schooner 11/06/2017, 1:26 PM

## 2017-11-06 NOTE — Progress Notes (Signed)
Speech Language Pathology Weekly Progress and Session Note  Patient Details  Name: Ian [name] MRN: [id number] Date of Birth: Jun 11, 1926  Beginning of progress report period: October 30, 2017 End of progress report period: November 06, 2017  Today's Date: 11/06/2017 SLP Individual Time: 0730-0810 SLP Individual Time Calculation (min): 40 min  Short Term Goals: Week 1: SLP Short Term Goal 1 (Week 1): Patient will consume current diet with minimal overt s/s of aspiration with Mod A verbal cues for use of small bites/sips and a slow rate of self-feeding.  SLP Short Term Goal 1 - Progress (Week 1): Met SLP Short Term Goal 2 (Week 1): Patient will recall new, daily information with Mod A multimodal cues.  SLP Short Term Goal 2 - Progress (Week 1): Not met SLP Short Term Goal 3 (Week 1): Patient will demonstrate functional problem solving for basic and familiar tasks with Mod A multimodal cues.  SLP Short Term Goal 3 - Progress (Week 1): Not met SLP Short Term Goal 4 (Week 1): Patient will identify 2 cognitive deficits with Max A multimodal cues.  SLP Short Term Goal 4 - Progress (Week 1): Not met SLP Short Term Goal 5 (Week 1): Patient will demonstrate selective attention in a mildly distracting enviornment for 30 minutes with Mod A verbal cues for redirection.  SLP Short Term Goal 5 - Progress (Week 1): Met    New Short Term Goals: Week 2: SLP Short Term Goal 1 (Week 2): Patient will demonstrate selective attention in a mildly distracting enviornment for 30 minutes with Min A verbal cues for redirection.  SLP Short Term Goal 2 (Week 2): Patient will identify 2 cognitive deficits with Max A multimodal cues.  SLP Short Term Goal 3 (Week 2): Patient will demonstrate functional problem solving for basic and familiar tasks with Mod A multimodal cues.  SLP Short Term Goal 4 (Week 2): Patient will recall new, daily information with Mod A multimodal cues.  SLP Short Term Goal 5 (Week 2): Patient will  consume current diet with minimal overt s/s of aspiration with Min A verbal cues for use of small bites/sips and a slow rate of self-feeding.   Weekly Progress Updates: Patient has made minimal gains and has met 2 of 5 STG's this reporting period. Patient's progress has been limited by intermittent verbal agitation with limited participation. Patient is currently consuming Dys. 3 textures with thin liquids with intermittent overt s/s of aspiration due to large bites/sips 2/2 to impulsivity. Patient requires moderate cues for use of compensatory strategies and becomes agitated when cued for slow rate and small bites/sips. Therefore, trials of upgraded textures have not been attempted. Patient also requires overall Max-Total A multimodal cues for recall of new information with use of aids, problem solving, awareness and overall safety with functional and familiar tasks. However, when patient is willing to participate, he can demonstrate selective attention to tasks for ~30 minutes in a mildly distracting environment with Mod A verbal cues. Patient and family education ongoing. Patient's wife is unable to provide the necessary assistance needed at this time, therefore, patient's d/c plan has been changed to SNF. Patient would benefit from continued skilled SLP intervention to maximize his cognitive and swallowing function and overall functional independence.      Intensity: Minumum of 1-2 x/day, 30 to 90 minutes Frequency: 3 to 5 out of 7 days Duration/Length of Stay: TBD due to SNF placement  Treatment/Interventions: Cognitive remediation/compensation;Environmental controls;Internal/external aids;Therapeutic Activities;Patient/family education;Functional tasks;Dysphagia/aspiration precaution training;Cueing hierarchy  Daily Session  Skilled Therapeutic Interventions: Skilled treatment session focused on cognitive and dysphagia goals. Upon arrival, patient was agreeable to particiapte in treatment  session. Patient transferred to the wheelchair with Mod A verbal cues for safety and Mod A verbal cues for problem solving during self-care tasks. Patient consumed breakfast meal of Dys. 3 textures with thin liquids with intermittent overt s/s of aspiration due to impulsivity with bite size and rate. Patient was also Mod I with extra time for tray set-up. Patient did not demonstrate any agitation this session, however, clinician did not engage verbally with patient and asked minimal questions throughout session in order for patient to feel he was directing his care. Patient left upright in wheelchair with all needs within reach, quick release belt in place and chair alarm on. Continue with current plan of care.    Function:   Eating Eating   Modified Consistency Diet: Yes Eating Assist Level: Set up assist for;Supervision or verbal cues   Eating Set Up Assist For: Opening containers       Cognition Comprehension Comprehension assist level: Understands basic 50 - 74% of the time/ requires cueing 25 - 49% of the time  Expression   Expression assist level: Expresses basic 50 - 74% of the time/requires cueing 25 - 49% of the time. Needs to repeat parts of sentences.  Social Interaction Social Interaction assist level: Interacts appropriately 50 - 74% of the time - May be physically or verbally inappropriate.  Problem Solving Problem solving assist level: Solves basic 25 - 49% of the time - needs direction more than half the time to initiate, plan or complete simple activities  Memory Memory assist level: Recognizes or recalls less than 25% of the time/requires cueing greater than 75% of the time   Pain Pain Assessment Pain Assessment: No/denies pain Pain Score: 0-No pain  Therapy/Group: Individual Therapy  Lama Narayanan 11/06/2017, 9:40 AM

## 2017-11-06 NOTE — Progress Notes (Addendum)
Occupational Therapy Weekly Progress Note  Patient Details  Name: Ian [name] MRN: [id number] Date of Birth: 1926/02/24  Beginning of progress report period: October 30, 2017 End of progress report period: November 06, 2017  Today's Date: 11/06/2017 OT Individual Time: 7915-0569 OT Individual Time Calculation (min): 41 min  and Today's Date: 11/06/2017 OT Missed Time: 19 Minutes Missed Time Reason: Patient unwilling/refused to participate without medical reason   Patient has met 3 of 3 short term goals. Pt with many barriers this session including behavior and decreased motivation to participate. Pt able to perform functional transfers with min A overall during therapy sessions. Pt refuses to preform various self care tasks unless absolutely necessary such as wet clothing with maximal encouragement.    Patient continues to demonstrate the following deficits: muscle weakness, decreased cardiorespiratoy endurance, decreased initiation, decreased attention, decreased awareness, decreased problem solving, decreased safety awareness, decreased memory and delayed processing and decreased sitting balance, decreased standing balance and decreased balance strategies and therefore will continue to benefit from skilled OT intervention to enhance overall performance with BADL.  Patient progressing toward long term goals..  Continue plan of care.  OT Short Term Goals Week 1:  OT Short Term Goal 1 (Week 1): Pt will perform toilet transfer with max A in order to decrease level of assistance for functional transfer. OT Short Term Goal 1 - Progress (Week 1): Met OT Short Term Goal 2 (Week 1): Pt will perform UB dressing with mod A. OT Short Term Goal 2 - Progress (Week 1): Met OT Short Term Goal 3 (Week 1): Pt will perform LB dressing with max A.  OT Short Term Goal 3 - Progress (Week 1): Met Week 2:  OT Short Term Goal 1 (Week 2): STGs=LTGs secondary to upcoming discharge  Skilled Therapeutic  Interventions/Progress Updates:    Upon entering the room, pt seated in wheelchair with no c/o pain this session. Pt requiring maximal encouragement for participation this session. Pt donned B shoes with assistance from therapist. Pt propelled wheelchair with B LEs and UEs 60' into day room with supervision and increased time. Pt transferred onto nustep with min A for stand pivot transfer. Pt on nustep for 15 minutes with 3 rest breaks needed secondary to fatigue. Pt transferring back to wheelchair and nustep and pt was incontinent in chair. Pt initially refusing to change clothing and yelling at therapist. OT assisted pt back to room via wheelchair and pt standing with min A allowed therapist to remove brief and pt cleaned peri area with set up A and min A for standing balance. New brief donned and pt returned to wheelchair. Pt declined further intervention at this time. He remained in wheelchair with quick release belt donned and chair alarm activated.   Therapy Documentation Precautions:  Precautions Precautions: Fall Restrictions Weight Bearing Restrictions: No General: General OT Amount of Missed Time: 19 Minutes Vital Signs: Therapy Vitals Temp: 98.2 F (36.8 C) Temp Source: Oral Pulse Rate: 80 BP: 130/74 Patient Position (if appropriate): Lying Oxygen Therapy SpO2: 98 % O2 Device: Room Air Pain: Pain Assessment Pain Assessment: No/denies pain Pain Score: 0-No pain  See Function Navigator for Current Functional Status.   Therapy/Group: Individual Therapy  Gypsy Decant 11/06/2017, 9:48 AM

## 2017-11-07 ENCOUNTER — Inpatient Hospital Stay (HOSPITAL_COMMUNITY): Payer: Medicare Other | Admitting: Occupational Therapy

## 2017-11-07 ENCOUNTER — Inpatient Hospital Stay (HOSPITAL_COMMUNITY): Payer: Medicare Other | Admitting: Physical Therapy

## 2017-11-07 ENCOUNTER — Inpatient Hospital Stay (HOSPITAL_COMMUNITY): Payer: Medicare Other | Admitting: Speech Pathology

## 2017-11-07 LAB — BASIC METABOLIC PANEL
Anion gap: 8 (ref 5–15)
BUN: 18 mg/dL (ref 6–20)
CO2: 24 mmol/L (ref 22–32)
Calcium: 10.2 mg/dL (ref 8.9–10.3)
Chloride: 103 mmol/L (ref 101–111)
Creatinine, Ser: 0.81 mg/dL (ref 0.61–1.24)
GFR calc Af Amer: >60 mL/min (ref >60)
GFR calc non Af Amer: >60 mL/min (ref >60)
Glucose, Bld: 94 mg/dL (ref 65–99)
Potassium: 3.4 mmol/L — ABNORMAL LOW (ref 3.5–5.1)
Sodium: 135 mmol/L (ref 135–145)

## 2017-11-07 LAB — CBC
HCT: 34.1 % — ABNORMAL LOW (ref 39.0–52.0)
Hemoglobin: 11.5 g/dL — ABNORMAL LOW (ref 13.0–17.0)
MCH: 32.7 pg (ref 26.0–34.0)
MCHC: 33.7 g/dL (ref 30.0–36.0)
MCV: 96.9 fL (ref 78.0–100.0)
Platelets: 331 10*3/uL (ref 150–400)
RBC: 3.52 MIL/uL — ABNORMAL LOW (ref 4.22–5.81)
RDW: 12.8 % (ref 11.5–15.5)
WBC: 12.3 10*3/uL — ABNORMAL HIGH (ref 4.0–10.5)

## 2017-11-07 NOTE — Progress Notes (Signed)
SLP Cancellation Note  Patient Details Name: Ian [name] MRN: [id number] DOB: 08/20/1926   Cancelled treatment:        Pt missed 30 minutes of skilled ST because of pt refusal. Pt resued session because he wanted to watch TV.                                                                                                 Jammie Clink 11/07/2017, 11:45 AM

## 2017-11-07 NOTE — Progress Notes (Addendum)
Physical Therapy Session Note  Patient Details  Name: Ian [name] MRN: [id number] Date of Birth: 1925/09/22  Today's Date: 11/07/2017 PT Individual Time: 6286-3817 PT Individual Time Calculation (min): 24 min   Short Term Goals: Week 2:  PT Short Term Goal 1 (Week 2): Pt will ambulate 50 ft with max assist +1.  PT Short Term Goal 2 (Week 2): Pt will consistently complete bed<>w/c with min assist +1.   Skilled Therapeutic Interventions/Progress Updates:  Pt received in bed, easily irritated, but agreeable to transferring OOB & did so with supervision & hospital bed features. Pt donned tennis shoes with supervision and completes bed>w/c via stand pivot with supervision. Pt agitated & confused, yelling, and reporting need to take his belongings to therapy with him because his wife is coming to get him released. Therapist & other staff members provided redirection. Transported pt to gym via w/c total assist for time management. Gait training x 30 ft + 50 ft with HHA + 2 with pt pushing with RUE and demonstrating absent dorsiflexion RLE, impaired coordination and weight shifting RLE. Pt propelled w/c back to room, requesting to use BUE & BLE. At end of session pt left sitting in w/c with QRB & chair alarm donned, call bell within reach & telesitter in room.   Therapy Documentation Precautions:  Precautions Precautions: Fall Restrictions Weight Bearing Restrictions: No    See Function Navigator for Current Functional Status.   Therapy/Group: Individual Therapy  Waunita Schooner 11/07/2017, 10:07 AM

## 2017-11-07 NOTE — Progress Notes (Signed)
Occupational Therapy Session Note  Patient Details  Name: Ian [name] MRN: [id number] Date of Birth: 1925-12-30  Today's Date: 11/07/2017 OT Individual Time: 30 minutes missed     Short Term Goals: Week 1:  OT Short Term Goal 1 (Week 1): Pt will perform toilet transfer with max A in order to decrease level of assistance for functional transfer. OT Short Term Goal 1 - Progress (Week 1): Met OT Short Term Goal 2 (Week 1): Pt will perform UB dressing with mod A. OT Short Term Goal 2 - Progress (Week 1): Met OT Short Term Goal 3 (Week 1): Pt will perform LB dressing with max A.  OT Short Term Goal 3 - Progress (Week 1): Met Week 2:  OT Short Term Goal 1 (Week 2): STGs=LTGs secondary to upcoming discharge  Skilled Therapeutic Interventions/Progress Updates:    Attempted to see pt for scheduled therapy with pt agitated, watching TV and not wanting OT to disturb him: "I'm going to say one thing to you, then get out." Pt becoming increasingly agitated when therapist asked him if ADL needs were met. He was left with safety belt fastened and all needs.   Therapy Documentation Precautions:  Precautions Precautions: Fall Restrictions Weight Bearing Restrictions: No General:   Vital Signs: Therapy Vitals Temp: (!) 97.5 F (36.4 C) Temp Source: Oral Pulse Rate: 83 Resp: 16 BP: 126/78 Oxygen Therapy SpO2: 97 % O2 Device: Room Air ADL:     See Function Navigator for Current Functional Status.   Therapy/Group: Individual Therapy  Ian [name] 11/07/2017, 7:33 AM

## 2017-11-07 NOTE — Progress Notes (Signed)
[facility] PHYSICAL MEDICINE & REHABILITATION     PROGRESS NOTE    Subjective/Complaints: No new issues.  Patient sleeping when I arrived.  ROS: Limited due to cognitive/behavioral    Objective: Vital Signs: Blood pressure 126/78, pulse 83, temperature (!) 97.5 F (36.4 C), temperature source Oral, resp. rate 16, height 5\' 11"  (1.803 m), weight 70.9 kg (156 lb 6.4 oz), SpO2 97 %. No results found. Recent Labs    11/06/17 0521 11/07/17 0643  WBC 13.3* 12.3*  HGB 12.1* 11.5*  HCT 35.4* 34.1*  PLT 323 331   Recent Labs    11/06/17 0521 11/07/17 0643  NA 135 135  K 3.2* 3.4*  CL 103 103  GLUCOSE 94 94  BUN 15 18  CREATININE 0.83 0.81  CALCIUM 10.0 10.2   CBG (last 3)  No results for input(s): GLUCAP in the last 72 hours.  Wt Readings from Last 3 Encounters:  11/07/17 70.9 kg (156 lb 6.4 oz)  10/29/17 78.2 kg (172 lb 6.4 oz)  02/01/16 79.8 kg (176 lb)    Physical Exam:  Constitutional: No distress . Vital signs reviewed. HEENT: EOMI, oral membranes moist Cardiovascular: RRR without murmur. No JVD    Respiratory: CTA Bilaterally without wheezes or rales. Normal effort    GI: BS +, non-tender, non-distended  Skin.Warm and dry with scattered hematomas improving Musculoskeletal:No edema or tenderness in extremities Neurological: Slow to awaken this morning.   Limited insight Motor: LUE: 5/5 proximal distal LLE: HF, 4/5 proximal distal RUE: 4-/5 proximal to distal RLE: HF, KE 2-3/5, ADF 0-1?/5/PF ---inconsistent Psych: Remains irritable  Assessment/Plan: 1. Functional deficits and right hemiparesis secondary to right occipital infarct/rhabdomyolysis which require 3+ hours per day of interdisciplinary therapy in a comprehensive inpatient rehab setting.  Change to QD. Physiatrist is providing close team supervision and 24 hour management of active medical problems listed below. Physiatrist and rehab team continue to assess barriers to discharge/monitor patient  progress toward functional and medical goals.  Function:  Bathing Bathing position Bathing activity did not occur: Refused    Bathing parts      Bathing assist        Upper Body Dressing/Undressing Upper body dressing Upper body dressing/undressing activity did not occur: Refused What is the patient wearing?: Pull over shirt/dress     Pull over shirt/dress - Perfomed by patient: Thread/unthread left sleeve, Thread/unthread right sleeve Pull over shirt/dress - Perfomed by helper: Put head through opening, Pull shirt over trunk        Upper body assist        Lower Body Dressing/Undressing Lower body dressing Lower body dressing/undressing activity did not occur: Refused What is the patient wearing?: Pants, Shoes, Non-skid slipper socks     Pants- Performed by patient: Thread/unthread left pants leg Pants- Performed by helper: Pull pants up/down, Thread/unthread right pants leg   Non-skid slipper socks- Performed by helper: Don/doff right sock, Don/doff left sock     Shoes - Performed by patient: Don/doff left shoe, Fasten right, Fasten left Shoes - Performed by helper: Don/doff right shoe          Lower body assist        Toileting Toileting Toileting activity did not occur: No continent bowel/bladder event   Toileting steps completed by helper: Adjust clothing prior to toileting, Performs perineal hygiene, Adjust clothing after toileting Toileting Assistive Devices: Grab bar or rail  Toileting assist Assist level: More than reasonable time   Transfers Chair/bed transfer Chair/bed transfer activity  did not occur: Refused Chair/bed transfer method: Stand pivot Chair/bed transfer assist level: Touching or steadying assistance (Pt > 75%) Chair/bed transfer assistive device: Armrests     Locomotion Ambulation Ambulation activity did not occur: Safety/medical concerns   Max distance: 50 Assist level: 2 helpers   Oceanographer activity did not occur:  Safety/medical concerns Type: Manual Max wheelchair distance: 100 ft Assist Level: Supervision or verbal cues  Cognition Comprehension Comprehension assist level: Understands basic 50 - 74% of the time/ requires cueing 25 - 49% of the time  Expression Expression assist level: Expresses basic 50 - 74% of the time/requires cueing 25 - 49% of the time. Needs to repeat parts of sentences.  Social Interaction Social Interaction assist level: Interacts appropriately 50 - 74% of the time - May be physically or verbally inappropriate.  Problem Solving Problem solving assist level: Solves basic 25 - 49% of the time - needs direction more than half the time to initiate, plan or complete simple activities  Memory Memory assist level: Recognizes or recalls less than 25% of the time/requires cueing greater than 75% of the time   Medical Problem List and Plan: 1.  Decreased functional mobility, possible syncopal event with fall secondary to right occipital embolic infarction as well as rhabdomyolysis.    -Aspirin and Plavix 3 weeks then Plavix alone. Plan for 30 day monitor on discharge     -SNF now pending   -Tele-sitter for safety    -will change therapy to QD 2.  DVT Prophylaxis/Anticoagulation: SCDs. Monitor for any signs of DVT   -Dopplers negative 3. Pain Management: Tylenol as needed 4. Mood/dementia. Discuss baseline with wife.  5. Neuropsych: This patient is ?fully capable of making decisions on his own behalf. 6. Skin/Wound Care: Routine skin checks 7. Fluids/Electrolytes/Nutrition:    Team to encourage p.o. Intake   -dietician follow up   -Continue to replace potassium 8. Hypertension. Norvasc 5 mg daily.     Controlled at present 3/15 9. Hypothyroidism. Synthroid. TSH 9.181. Free T4 0.77. Continue current supplement for now 10. Dysphagia. Follow-up speech therapy.    Continue D3 thin liquids, advance diet as tolerated.   -intake borderline  13. Diastolic dysfunction: no current  signs/symptoms of fluid overload  Filed Weights   11/05/17 0153 11/06/17 0500 11/07/17 0500  Weight: 71.2 kg (156 lb 15.5 oz) 72 kg (158 lb 11.7 oz) 70.9 kg (156 lb 6.4 oz)    Stable 3/14--- 14. Leukocytosis: Down to 12.3 k today.. No s/s of infection       LOS (Days) 9 A FACE TO FACE EVALUATION WAS PERFORMED  Meredith Staggers, MD 11/07/2017 11:08 AM

## 2017-11-07 NOTE — Plan of Care (Signed)
  Progressing RH BOWEL ELIMINATION RH STG MANAGE BOWEL WITH ASSISTANCE Description STG Manage Bowel with mod I Assistance.  11/07/2017 1245 - Progressing by Brita Romp, RN RH STG MANAGE BOWEL W/MEDICATION W/ASSISTANCE Description STG Manage Bowel with Medication with mod I Assistance.  11/07/2017 8099 - Progressing by Brita Romp, RN RH SKIN INTEGRITY RH STG SKIN FREE OF INFECTION/BREAKDOWN Description Patients skin will remain free from further infection or breakdown with min assist.   11/07/2017 8338 - Progressing by Brita Romp, RN RH STG MAINTAIN SKIN INTEGRITY WITH ASSISTANCE Description STG Maintain Skin Integrity With min Assistance.  11/07/2017 2505 - Progressing by Brita Romp, RN RH SAFETY RH STG ADHERE TO SAFETY PRECAUTIONS W/ASSISTANCE/DEVICE Description STG Adhere to Safety Precautions With min Assistance/Device.  11/07/2017 3976 - Progressing by Brita Romp, RN   Not Progressing RH COGNITION-NURSING RH STG USES MEMORY AIDS/STRATEGIES W/ASSIST TO PROBLEM SOLVE Description STG Uses Memory Aids/Strategies With min Assistance to Problem Solve.  11/07/2017 7341 - Not Progressing by Brita Romp, RN RH KNOWLEDGE DEFICIT RH STG INCREASE KNOWLEDGE OF HYPERTENSION 11/07/2017 9379 - Not Progressing by Brita Romp, RN

## 2017-11-08 ENCOUNTER — Inpatient Hospital Stay (HOSPITAL_COMMUNITY): Payer: Medicare Other | Admitting: Occupational Therapy

## 2017-11-08 ENCOUNTER — Inpatient Hospital Stay (HOSPITAL_COMMUNITY): Payer: Medicare Other | Admitting: Physical Therapy

## 2017-11-08 LAB — COMPREHENSIVE METABOLIC PANEL
ALT: 18 U/L (ref 17–63)
AST: 24 U/L (ref 15–41)
Albumin: 2.6 g/dL — ABNORMAL LOW (ref 3.5–5.0)
Alkaline Phosphatase: 53 U/L (ref 38–126)
Anion gap: 13 (ref 5–15)
BUN: 64 mg/dL — ABNORMAL HIGH (ref 6–20)
CO2: 16 mmol/L — ABNORMAL LOW (ref 22–32)
Calcium: 9.8 mg/dL (ref 8.9–10.3)
Chloride: 107 mmol/L (ref 101–111)
Creatinine, Ser: 1.04 mg/dL (ref 0.61–1.24)
GFR calc Af Amer: >60 mL/min (ref >60)
GFR calc non Af Amer: >60 mL/min (ref >60)
Glucose, Bld: 127 mg/dL — ABNORMAL HIGH (ref 65–99)
Potassium: 3.8 mmol/L (ref 3.5–5.1)
Sodium: 136 mmol/L (ref 135–145)
Total Bilirubin: 1.4 mg/dL — ABNORMAL HIGH (ref 0.3–1.2)
Total Protein: 5.5 g/dL — ABNORMAL LOW (ref 6.5–8.1)

## 2017-11-08 LAB — CBC
HCT: 24.4 % — ABNORMAL LOW (ref 39.0–52.0)
Hemoglobin: 8.4 g/dL — ABNORMAL LOW (ref 13.0–17.0)
MCH: 33.5 pg (ref 26.0–34.0)
MCHC: 34.4 g/dL (ref 30.0–36.0)
MCV: 97.2 fL (ref 78.0–100.0)
Platelets: 336 10*3/uL (ref 150–400)
RBC: 2.51 MIL/uL — ABNORMAL LOW (ref 4.22–5.81)
RDW: 12.9 % (ref 11.5–15.5)
WBC: 14.2 10*3/uL — ABNORMAL HIGH (ref 4.0–10.5)

## 2017-11-08 LAB — OCCULT BLOOD X 1 CARD TO LAB, STOOL: Fecal Occult Bld: POSITIVE — AB

## 2017-11-08 MED ORDER — SODIUM CHLORIDE 0.45 % IV SOLN
INTRAVENOUS | Status: DC
  Administered 2017-11-08: 23:00:00 via INTRAVENOUS

## 2017-11-08 MED ORDER — PANTOPRAZOLE SODIUM 40 MG IV SOLR
40.0000 mg | Freq: Two times a day (BID) | INTRAVENOUS | Status: DC
  Administered 2017-11-08: 40 mg via INTRAVENOUS
  Filled 2017-11-08: qty 40

## 2017-11-08 MED ORDER — DIPHENHYDRAMINE HCL 25 MG PO CAPS: 25.0000 mg | ORAL_CAPSULE | Freq: Once | ORAL | Status: DC

## 2017-11-08 MED ORDER — ACETAMINOPHEN 325 MG PO TABS: 650.0000 mg | ORAL_TABLET | Freq: Once | ORAL | Status: DC

## 2017-11-08 MED ORDER — SODIUM CHLORIDE 0.9 % IV SOLN: Freq: Once | INTRAVENOUS | Status: DC

## 2017-11-08 MED ORDER — FUROSEMIDE 10 MG/ML IJ SOLN: 20.0000 mg | Freq: Once | INTRAMUSCULAR | Status: DC

## 2017-11-08 MED ORDER — DIPHENHYDRAMINE HCL 50 MG/ML IJ SOLN: 25.0000 mg | Freq: Once | INTRAMUSCULAR | Status: DC

## 2017-11-08 NOTE — Progress Notes (Signed)
Physical Therapy Session Note  Patient Details  Name: Ian [name] MRN: [id number] Date of Birth: 1926-05-25  Pt required max encouragement to engage with therapist upon arrival. Pt watching TV in supine and refusing to participate as he states he already did his "exericses" today and that he just got comfortable. Pt physically agitated, slamming arms into bed when talking. Unsuccessful attempts to reorient pt to therapy schedules and offers for him to choose what activities we performed in therapy. Pt missed 30 min of skilled PT 2/2 agitation and refusal.   Naksh Radi K Arnette 11/08/2017, 11:42 AM

## 2017-11-08 NOTE — Progress Notes (Signed)
Nurse called to room approximately 0930. OT reports patient whole body was shaking and not responding like normal. When this RN went to room patient was drowsy but woke up and stated "I need to go to the bathroom." MD Naaman Plummer at bedside after incident. Patient assisted to bathroom and had large continent BM. Patient then assisted back to bed. Vitals stable. Cognition appears baseline from the a.m. No tremors/shakes noted. Continue to monitor.

## 2017-11-08 NOTE — Plan of Care (Signed)
  Progressing RH BOWEL ELIMINATION RH STG MANAGE BOWEL WITH ASSISTANCE Description STG Manage Bowel with mod I Assistance.  11/08/2017 1132 - Progressing by Galaxy Borden, Renato Gails, RN RH STG MANAGE BOWEL W/MEDICATION W/ASSISTANCE Description STG Manage Bowel with Medication with mod I Assistance.  11/08/2017 1132 - Progressing by Terrianna Holsclaw, Renato Gails, RN RH SKIN INTEGRITY RH STG SKIN FREE OF INFECTION/BREAKDOWN Description Patients skin will remain free from further infection or breakdown with min assist.   11/08/2017 1132 - Progressing by Deneice Wack, Renato Gails, RN RH STG MAINTAIN SKIN INTEGRITY WITH ASSISTANCE Description STG Maintain Skin Integrity With min Assistance.  11/08/2017 1132 - Progressing by Meyli Boice, Renato Gails, RN RH SAFETY RH STG ADHERE TO SAFETY PRECAUTIONS W/ASSISTANCE/DEVICE Description STG Adhere to Safety Precautions With min Assistance/Device.  11/08/2017 1132 - Progressing by Yarelli Decelles, Renato Gails, RN   Not Progressing RH COGNITION-NURSING RH STG USES MEMORY AIDS/STRATEGIES W/ASSIST TO PROBLEM SOLVE Description STG Uses Memory Aids/Strategies With min Assistance to Problem Solve.  11/08/2017 1132 - Not Progressing by Hillery Jacks, RN RH KNOWLEDGE DEFICIT RH STG INCREASE KNOWLEDGE OF HYPERTENSION 11/08/2017 1132 - Not Progressing by Dillyn Menna, Renato Gails, RN

## 2017-11-08 NOTE — Progress Notes (Signed)
At 2100, coffee ground emesis along with black stool. Dr. Naaman Plummer notified R/T above. Vitals 99.0, HR-114, BP 109/67, Resp.20,  O2 sat 99%. New orders received. At 2210- incont stool, Hemoccult spec. Sent, O2 sat 87%, O2 added at 2 L/m. Paged Dr. Naaman Plummer, new orders received. Hemoccult (+) Dr. Arville Go aware. [name]

## 2017-11-08 NOTE — Progress Notes (Signed)
Occupational Therapy Session Note  Patient Details  Name: Ian [name] MRN: [id number] Date of Birth: October 19, 1925  Today's Date: 11/08/2017 OT Individual Time: 5956-3875 OT Individual Time Calculation (min): 28 min   Short Term Goals: Week 2:  OT Short Term Goal 1 (Week 2): STGs=LTGs secondary to upcoming discharge  Skilled Therapeutic Interventions/Progress Updates:    Pt greeted supine in bed, agitated, but willing to transfer to w/c. Stand pivot completed with Min A and max cues for safety. Donned clean brief with Min A for sit<stand. Worked on ADL retraining while donning pants afterwards. Provided pt with increased time to self organize and instruction for adaptive techniques. Pt dismissive of education. He then reported feeling dizzy, began smacking lips/drooling and making nonverbal utterances. His UEs/LEs began jerking with pt sliding forward in w/c. OT held onto pt in w/c until jerking subsided (<30 seconds) then called for staff assist. RN staff in to assess pt with OT present. Pts affect/cognition appearing WNL. His pants were then lifted over hips in standing and he was assisted to toilet by OT via stand pivot. Pt left with RN staff to further assess vitals.    Therapy Documentation Precautions:  Precautions Precautions: Fall Restrictions Weight Bearing Restrictions: No Vital Signs: Therapy Vitals Pulse Rate: 96 BP: 105/71 Patient Position (if appropriate): Lying Pain: No c/o pain during session    ADL:      See Function Navigator for Current Functional Status.   Therapy/Group: Individual Therapy  Merdis Snodgrass A Jestine Bicknell 11/08/2017, 4:38 PM

## 2017-11-08 NOTE — H&P (Signed)
Called by RN with report that patient producing what appeared to be coffee ground emesis. Specimen sent for occult blood which was positive. Stat hgb ordered with drop from 11.5 yesterday to 8.4 this evening. RN notes further coffee ground emesis and black stools. Pt generally less responsive but denies nausea, abdominal pain. Remains irritable per baseline. Placed on oxygen for decreased O2 sats into 80's.   BP 109/67 (BP Location: Left Arm)   Pulse (!) 110   Temp 99 F (37.2 C) (Oral)   Resp (!) 28   Ht 5\' 11"  (1.803 m)   Wt 71 kg (156 lb 8.4 oz)   SpO2 99%   BMI 21.83 kg/m     Assesment:  1. Acute Upper GI bleed. I see no history of GI bleed in records nor were there any issues during this rehab admission   Plan: 1. Tranfuse 2u PRBC 2. IVF at 100cc hours 3. IV protonix 4. Continue supplemental oxygen 5. Hold Plavix and ASA 6. Check EKG 7. Other labwork pending 8. Have contacted [facility] regarding transfer for more intensive monitoring. GI consult    Meredith Staggers, MD, [facility] 11/08/2017

## 2017-11-08 NOTE — Progress Notes (Signed)
[facility]     PROGRESS NOTE    Subjective/Complaints: Pt with unremarkable night. Up with RN/OT and OT reports he started to "flail". Concern that he was having seizure. When I arrived pt standing with staff wanting to go to bathroom. Did say he felt a little dizzy  ROS: Patient denies fever, rash, sore throat, blurred vision, nausea, vomiting, diarrhea, cough, shortness of breath or chest pain, joint or back pain, headache, or mood change.   Objective: Vital Signs: Blood pressure 118/76, pulse 89, temperature 98 F (36.7 C), temperature source Oral, resp. rate 16, height 5\' 11"  (1.803 m), weight 71 kg (156 lb 8.4 oz), SpO2 96 %. No results found. Recent Labs    11/06/17 0521 11/07/17 0643  WBC 13.3* 12.3*  HGB 12.1* 11.5*  HCT 35.4* 34.1*  PLT 323 331   Recent Labs    11/06/17 0521 11/07/17 0643  NA 135 135  K 3.2* 3.4*  CL 103 103  GLUCOSE 94 94  BUN 15 18  CREATININE 0.83 0.81  CALCIUM 10.0 10.2   CBG (last 3)  No results for input(s): GLUCAP in the last 72 hours.  Wt Readings from Last 3 Encounters:  11/08/17 71 kg (156 lb 8.4 oz)  10/29/17 78.2 kg (172 lb 6.4 oz)  02/01/16 79.8 kg (176 lb)    Physical Exam:  Constitutional: No distress . Vital signs reviewed. HEENT: EOMI, oral membranes moist Cardiovascular: RRR without murmur. No JVD    Respiratory: CTA Bilaterally without wheezes or rales. Normal effort    GI: BS +, non-tender, non-distended  Skin.Warm and dry with scattered hematomas improving Musculoskeletal:No edema or tenderness in extremities Neurological: alert, no focal CN findings. Follows simple commands. Able to maintain standing while holding on to walker. Motor: LUE: 5/5 proximal distal LLE: HF, 4/5 proximal distal RUE: 4-/5 proximal to distal RLE: HF, KE 2-3/5, ADF 1-2/5? Psych: Remains irritable  Assessment/Plan: 1. Functional deficits and right hemiparesis secondary to right occipital  infarct/rhabdomyolysis which require 3+ hours per day of interdisciplinary therapy in a comprehensive inpatient rehab setting.  Change to QD. Physiatrist is providing close team supervision and 24 hour management of active medical problems listed below. Physiatrist and rehab team continue to assess barriers to discharge/monitor patient progress toward functional and medical goals.  Function:  Bathing Bathing position Bathing activity did not occur: Refused    Bathing parts      Bathing assist        Upper Body Dressing/Undressing Upper body dressing Upper body dressing/undressing activity did not occur: Refused What is the patient wearing?: Pull over shirt/dress     Pull over shirt/dress - Perfomed by patient: Thread/unthread left sleeve, Thread/unthread right sleeve Pull over shirt/dress - Perfomed by helper: Put head through opening, Pull shirt over trunk        Upper body assist        Lower Body Dressing/Undressing Lower body dressing Lower body dressing/undressing activity did not occur: Refused What is the patient wearing?: Pants, Shoes, Non-skid slipper socks     Pants- Performed by patient: Thread/unthread left pants leg Pants- Performed by helper: Pull pants up/down, Thread/unthread right pants leg   Non-skid slipper socks- Performed by helper: Don/doff right sock, Don/doff left sock     Shoes - Performed by patient: Don/doff left shoe, Fasten right, Fasten left Shoes - Performed by helper: Don/doff right shoe          Lower body assist  Toileting Toileting Toileting activity did not occur: No continent bowel/bladder event   Toileting steps completed by helper: Adjust clothing prior to toileting, Performs perineal hygiene, Adjust clothing after toileting Toileting Assistive Devices: Grab bar or rail  Toileting assist Assist level: More than reasonable time   Transfers Chair/bed transfer Chair/bed transfer activity did not occur: Refused Chair/bed  transfer method: Stand pivot Chair/bed transfer assist level: Touching or steadying assistance (Pt > 75%) Chair/bed transfer assistive device: Armrests     Locomotion Ambulation Ambulation activity did not occur: Safety/medical concerns   Max distance: 50 Assist level: 2 helpers   Oceanographer activity did not occur: Safety/medical concerns Type: Manual Max wheelchair distance: 100 ft Assist Level: Supervision or verbal cues  Cognition Comprehension Comprehension assist level: Understands basic 50 - 74% of the time/ requires cueing 25 - 49% of the time  Expression Expression assist level: Expresses basic 50 - 74% of the time/requires cueing 25 - 49% of the time. Needs to repeat parts of sentences.  Social Interaction Social Interaction assist level: Interacts appropriately 50 - 74% of the time - May be physically or verbally inappropriate.  Problem Solving Problem solving assist level: Solves basic 25 - 49% of the time - needs direction more than half the time to initiate, plan or complete simple activities  Memory Memory assist level: Recognizes or recalls less than 25% of the time/requires cueing greater than 75% of the time   Medical Problem List and Plan: 1.  Decreased functional mobility, possible syncopal event with fall secondary to right occipital embolic infarction as well as rhabdomyolysis.    -Aspirin and Plavix 3 weeks then Plavix alone. Plan for 30 day monitor on discharge     -SNF now pending   -Tele-sitter for safety    -  therapy   QD 2.  DVT Prophylaxis/Anticoagulation: SCDs. Monitor for any signs of DVT   -Dopplers negative 3. Pain Management: Tylenol as needed 4. Mood/dementia. Discuss baseline with wife.  5. Neuropsych: This patient is not capable of making decisions on his own behalf. 6. Skin/Wound Care: Routine skin checks 7. Fluids/Electrolytes/Nutrition:    Team to encourage p.o. Intake   -dietician follow up   -Continue to replace potassium 8.  Hypertension. Norvasc 5 mg daily.     Controlled at present 3/16---avoid overtreating 9. Hypothyroidism. Synthroid. TSH 9.181. Free T4 0.77. Continue current supplement for now 10. Dysphagia. Follow-up speech therapy.    Continue D3 thin liquids, advance diet as tolerated.   -intake borderline  13. Diastolic dysfunction: no current signs/symptoms of fluid overload  Filed Weights   11/06/17 0500 11/07/17 0500 11/08/17 0659  Weight: 72 kg (158 lb 11.7 oz) 70.9 kg (156 lb 6.4 oz) 71 kg (156 lb 8.4 oz)    Stable 3/16- 14. Leukocytosis: Down to 12.3 k today.. No s/s of infection  15. "episode" this morning: ?mild orthostasis. Certainly no seizure   -asked for fresh VS   -observe today, encourage PO fluids   -labs just checked yesterday and WNL      LOS (Days) [facility] [place] EVALUATION WAS PERFORMED  Meredith Staggers, MD 11/08/2017 9:29 AM

## 2017-11-09 ENCOUNTER — Inpatient Hospital Stay (HOSPITAL_COMMUNITY)
Admission: AD | Admit: 2017-11-09 | Discharge: 2017-11-11 | DRG: 378 | Disposition: A | Discharge status: Skilled Nursing Facility | Payer: Medicare Other | Source: Other Acute Inpatient Hospital | Attending: Family Medicine | Admitting: Family Medicine

## 2017-11-09 ENCOUNTER — Encounter (HOSPITAL_COMMUNITY)
Admission: AD | Disposition: A | Discharge status: Skilled Nursing Facility | Payer: Self-pay | Attending: Family Medicine

## 2017-11-09 ENCOUNTER — Inpatient Hospital Stay (HOSPITAL_COMMUNITY): Payer: Medicare Other | Admitting: Certified Registered Nurse Anesthetist

## 2017-11-09 ENCOUNTER — Encounter (HOSPITAL_COMMUNITY): Payer: Self-pay | Admitting: Internal Medicine

## 2017-11-09 ENCOUNTER — Inpatient Hospital Stay (HOSPITAL_COMMUNITY): Payer: Medicare Other | Admitting: Occupational Therapy

## 2017-11-09 ENCOUNTER — Inpatient Hospital Stay (HOSPITAL_COMMUNITY): Payer: Medicare Other | Admitting: Physical Therapy

## 2017-11-09 ENCOUNTER — Other Ambulatory Visit: Payer: Self-pay | Admitting: Family Medicine

## 2017-11-09 DIAGNOSIS — Z79899 Other long term (current) drug therapy: Secondary | ICD-10-CM

## 2017-11-09 DIAGNOSIS — D62 Acute posthemorrhagic anemia: Secondary | ICD-10-CM

## 2017-11-09 DIAGNOSIS — E876 Hypokalemia: Secondary | ICD-10-CM | POA: Diagnosis not present

## 2017-11-09 DIAGNOSIS — E041 Nontoxic single thyroid nodule: Secondary | ICD-10-CM | POA: Diagnosis present

## 2017-11-09 DIAGNOSIS — I519 Heart disease, unspecified: Secondary | ICD-10-CM | POA: Diagnosis not present

## 2017-11-09 DIAGNOSIS — I10 Essential (primary) hypertension: Secondary | ICD-10-CM | POA: Diagnosis not present

## 2017-11-09 DIAGNOSIS — D72828 Other elevated white blood cell count: Secondary | ICD-10-CM | POA: Diagnosis present

## 2017-11-09 DIAGNOSIS — K263 Acute duodenal ulcer without hemorrhage or perforation: Secondary | ICD-10-CM | POA: Diagnosis present

## 2017-11-09 DIAGNOSIS — Z7982 Long term (current) use of aspirin: Secondary | ICD-10-CM

## 2017-11-09 DIAGNOSIS — I9589 Other hypotension: Secondary | ICD-10-CM | POA: Diagnosis present

## 2017-11-09 DIAGNOSIS — I69391 Dysphagia following cerebral infarction: Secondary | ICD-10-CM

## 2017-11-09 DIAGNOSIS — Z8711 Personal history of peptic ulcer disease: Secondary | ICD-10-CM | POA: Diagnosis not present

## 2017-11-09 DIAGNOSIS — F0390 Unspecified dementia without behavioral disturbance: Secondary | ICD-10-CM

## 2017-11-09 DIAGNOSIS — M6282 Rhabdomyolysis: Secondary | ICD-10-CM | POA: Diagnosis not present

## 2017-11-09 DIAGNOSIS — I5032 Chronic diastolic (congestive) heart failure: Secondary | ICD-10-CM | POA: Diagnosis present

## 2017-11-09 DIAGNOSIS — K279 Peptic ulcer, site unspecified, unspecified as acute or chronic, without hemorrhage or perforation: Secondary | ICD-10-CM

## 2017-11-09 DIAGNOSIS — I639 Cerebral infarction, unspecified: Secondary | ICD-10-CM | POA: Diagnosis present

## 2017-11-09 DIAGNOSIS — I110 Hypertensive heart disease with heart failure: Secondary | ICD-10-CM | POA: Diagnosis present

## 2017-11-09 DIAGNOSIS — Z7902 Long term (current) use of antithrombotics/antiplatelets: Secondary | ICD-10-CM | POA: Diagnosis not present

## 2017-11-09 DIAGNOSIS — E039 Hypothyroidism, unspecified: Secondary | ICD-10-CM | POA: Diagnosis present

## 2017-11-09 DIAGNOSIS — K921 Melena: Principal | ICD-10-CM | POA: Diagnosis present

## 2017-11-09 DIAGNOSIS — Z79890 Hormone replacement therapy (postmenopausal): Secondary | ICD-10-CM | POA: Diagnosis not present

## 2017-11-09 DIAGNOSIS — I248 Other forms of acute ischemic heart disease: Secondary | ICD-10-CM | POA: Diagnosis not present

## 2017-11-09 DIAGNOSIS — R000 Tachycardia, unspecified: Secondary | ICD-10-CM | POA: Diagnosis present

## 2017-11-09 DIAGNOSIS — T39395A Adverse effect of other nonsteroidal anti-inflammatory drugs [NSAID], initial encounter: Secondary | ICD-10-CM

## 2017-11-09 DIAGNOSIS — I440 Atrioventricular block, first degree: Secondary | ICD-10-CM | POA: Diagnosis present

## 2017-11-09 DIAGNOSIS — K922 Gastrointestinal hemorrhage, unspecified: Secondary | ICD-10-CM

## 2017-11-09 DIAGNOSIS — K0889 Other specified disorders of teeth and supporting structures: Secondary | ICD-10-CM | POA: Diagnosis present

## 2017-11-09 DIAGNOSIS — I6300 Cerebral infarction due to thrombosis of unspecified precerebral artery: Secondary | ICD-10-CM | POA: Diagnosis not present

## 2017-11-09 DIAGNOSIS — I712 Thoracic aortic aneurysm, without rupture: Secondary | ICD-10-CM | POA: Diagnosis present

## 2017-11-09 DIAGNOSIS — F1010 Alcohol abuse, uncomplicated: Secondary | ICD-10-CM | POA: Diagnosis present

## 2017-11-09 DIAGNOSIS — Z8673 Personal history of transient ischemic attack (TIA), and cerebral infarction without residual deficits: Secondary | ICD-10-CM

## 2017-11-09 DIAGNOSIS — I69351 Hemiplegia and hemiparesis following cerebral infarction affecting right dominant side: Secondary | ICD-10-CM | POA: Diagnosis not present

## 2017-11-09 DIAGNOSIS — I5030 Unspecified diastolic (congestive) heart failure: Secondary | ICD-10-CM | POA: Diagnosis not present

## 2017-11-09 DIAGNOSIS — I5189 Other ill-defined heart diseases: Secondary | ICD-10-CM | POA: Diagnosis present

## 2017-11-09 HISTORY — PX: ESOPHAGOGASTRODUODENOSCOPY (EGD) WITH PROPOFOL: SHX5813

## 2017-11-09 LAB — COMPREHENSIVE METABOLIC PANEL
ALT: 17 U/L (ref 17–63)
AST: 24 U/L (ref 15–41)
Albumin: 2.6 g/dL — ABNORMAL LOW (ref 3.5–5.0)
Alkaline Phosphatase: 52 U/L (ref 38–126)
Anion gap: 12 (ref 5–15)
BUN: 70 mg/dL — ABNORMAL HIGH (ref 6–20)
CO2: 18 mmol/L — ABNORMAL LOW (ref 22–32)
Calcium: 9.9 mg/dL (ref 8.9–10.3)
Chloride: 107 mmol/L (ref 101–111)
Creatinine, Ser: 1.09 mg/dL (ref 0.61–1.24)
GFR calc Af Amer: >60 mL/min (ref >60)
GFR calc non Af Amer: 58 mL/min — ABNORMAL LOW (ref >60)
Glucose, Bld: 118 mg/dL — ABNORMAL HIGH (ref 65–99)
Potassium: 4 mmol/L (ref 3.5–5.1)
Sodium: 137 mmol/L (ref 135–145)
Total Bilirubin: 1.3 mg/dL — ABNORMAL HIGH (ref 0.3–1.2)
Total Protein: 5.6 g/dL — ABNORMAL LOW (ref 6.5–8.1)

## 2017-11-09 LAB — URINALYSIS, ROUTINE W REFLEX MICROSCOPIC
Bacteria, UA: NONE SEEN
Bilirubin Urine: NEGATIVE
Glucose, UA: NEGATIVE mg/dL
Ketones, ur: NEGATIVE mg/dL
Leukocytes, UA: NEGATIVE
Nitrite: NEGATIVE
Protein, ur: NEGATIVE mg/dL
RBC / HPF: NONE SEEN RBC/hpf (ref 0–5)
Specific Gravity, Urine: 1.019 (ref 1.005–1.030)
WBC, UA: NONE SEEN WBC/hpf (ref 0–5)
pH: 5 (ref 5.0–8.0)

## 2017-11-09 LAB — MRSA PCR SCREENING: MRSA by PCR: NEGATIVE

## 2017-11-09 LAB — RETICULOCYTES
RBC.: 2.49 MIL/uL — ABNORMAL LOW (ref 4.22–5.81)
Retic Count, Absolute: 49.8 10*3/uL (ref 19.0–186.0)
Retic Ct Pct: 2 % (ref 0.4–3.1)

## 2017-11-09 LAB — MAGNESIUM: Magnesium: 1 mg/dL — ABNORMAL LOW (ref 1.7–2.4)

## 2017-11-09 LAB — IRON AND TIBC
Iron: 147 ug/dL (ref 45–182)
Saturation Ratios: 61 % — ABNORMAL HIGH (ref 17.9–39.5)
TIBC: 239 ug/dL — ABNORMAL LOW (ref 250–450)
UIBC: 92 ug/dL

## 2017-11-09 LAB — CBC
HCT: 29.9 % — ABNORMAL LOW (ref 39.0–52.0)
HCT: 28.9 % — ABNORMAL LOW (ref 39.0–52.0)
Hemoglobin: 10.7 g/dL — ABNORMAL LOW (ref 13.0–17.0)
Hemoglobin: 10.4 g/dL — ABNORMAL LOW (ref 13.0–17.0)
MCH: 32.4 pg (ref 26.0–34.0)
MCH: 32.6 pg (ref 26.0–34.0)
MCHC: 35.8 g/dL (ref 30.0–36.0)
MCHC: 36 g/dL (ref 30.0–36.0)
MCV: 90.6 fL (ref 78.0–100.0)
MCV: 90.6 fL (ref 78.0–100.0)
Platelets: 250 10*3/uL (ref 150–400)
Platelets: 259 10*3/uL (ref 150–400)
RBC: 3.3 MIL/uL — ABNORMAL LOW (ref 4.22–5.81)
RBC: 3.19 MIL/uL — ABNORMAL LOW (ref 4.22–5.81)
RDW: 15.1 % (ref 11.5–15.5)
RDW: 15.6 % — ABNORMAL HIGH (ref 11.5–15.5)
WBC: 13.8 10*3/uL — ABNORMAL HIGH (ref 4.0–10.5)
WBC: 18.2 10*3/uL — ABNORMAL HIGH (ref 4.0–10.5)

## 2017-11-09 LAB — TSH: TSH: 10.491 u[IU]/mL — ABNORMAL HIGH (ref 0.350–4.500)

## 2017-11-09 LAB — PROTIME-INR
INR: 1.26
Prothrombin Time: 15.6 seconds — ABNORMAL HIGH (ref 11.4–15.2)

## 2017-11-09 LAB — PREPARE RBC (CROSSMATCH)

## 2017-11-09 LAB — TROPONIN I
Troponin I: <0.03 ng/mL (ref <0.03)
Troponin I: <0.03 ng/mL (ref <0.03)
Troponin I: <0.03 ng/mL (ref <0.03)

## 2017-11-09 LAB — FERRITIN: Ferritin: 617 ng/mL — ABNORMAL HIGH (ref 24–336)

## 2017-11-09 LAB — VITAMIN B12: Vitamin B-12: 186 pg/mL (ref 180–914)

## 2017-11-09 LAB — FOLATE: Folate: 7.7 ng/mL (ref >5.9)

## 2017-11-09 LAB — ABO/RH: ABO/RH(D): O POS

## 2017-11-09 LAB — PHOSPHORUS: Phosphorus: 3 mg/dL (ref 2.5–4.6)

## 2017-11-09 SURGERY — ESOPHAGOGASTRODUODENOSCOPY (EGD) WITH PROPOFOL
Anesthesia: Monitor Anesthesia Care

## 2017-11-09 MED ORDER — ONDANSETRON HCL 4 MG PO TABS: 4.0000 mg | ORAL_TABLET | Freq: Four times a day (QID) | ORAL | Status: DC | PRN

## 2017-11-09 MED ORDER — SODIUM CHLORIDE 0.9 % IV SOLN
8.0000 mg/h | INTRAVENOUS | Status: DC
  Administered 2017-11-09 – 2017-11-10 (×3): 8 mg/h via INTRAVENOUS
  Filled 2017-11-09 (×6): qty 80

## 2017-11-09 MED ORDER — ACETAMINOPHEN 325 MG PO TABS
650.0000 mg | ORAL_TABLET | Freq: Once | ORAL | Status: AC
  Administered 2017-11-09: 650 mg via ORAL
  Filled 2017-11-09: qty 2

## 2017-11-09 MED ORDER — PANTOPRAZOLE SODIUM 40 MG IV SOLR: 40.0000 mg | Freq: Two times a day (BID) | INTRAVENOUS | Status: DC

## 2017-11-09 MED ORDER — SODIUM CHLORIDE 0.9 % IV SOLN: INTRAVENOUS | Status: DC

## 2017-11-09 MED ORDER — PANTOPRAZOLE SODIUM 40 MG IV SOLR
40.0000 mg | Freq: Once | INTRAVENOUS | Status: AC
  Administered 2017-11-09: 40 mg via INTRAVENOUS
  Filled 2017-11-09: qty 40

## 2017-11-09 MED ORDER — ACETAMINOPHEN 650 MG RE SUPP: 650.0000 mg | Freq: Four times a day (QID) | RECTAL | Status: DC | PRN

## 2017-11-09 MED ORDER — LACTATED RINGERS IV SOLN
INTRAVENOUS | Status: DC
  Administered 2017-11-09: 1000 mL via INTRAVENOUS
  Administered 2017-11-09: 10:00:00 via INTRAVENOUS

## 2017-11-09 MED ORDER — SODIUM CHLORIDE 0.9 % IV SOLN
INTRAVENOUS | Status: DC
  Administered 2017-11-09: 02:00:00 via INTRAVENOUS

## 2017-11-09 MED ORDER — ORAL CARE MOUTH RINSE
15.0000 mL | Freq: Two times a day (BID) | OROMUCOSAL | Status: DC
  Administered 2017-11-09: 15 mL via OROMUCOSAL

## 2017-11-09 MED ORDER — CHLORHEXIDINE GLUCONATE 0.12 % MT SOLN
15.0000 mL | Freq: Two times a day (BID) | OROMUCOSAL | Status: DC
  Administered 2017-11-09 – 2017-11-11 (×2): 15 mL via OROMUCOSAL
  Filled 2017-11-09 (×3): qty 15

## 2017-11-09 MED ORDER — BUTAMBEN-TETRACAINE-BENZOCAINE 2-2-14 % EX AERO
INHALATION_SPRAY | CUTANEOUS | Status: DC | PRN
  Administered 2017-11-09: 1 via TOPICAL

## 2017-11-09 MED ORDER — PROPOFOL 10 MG/ML IV BOLUS
INTRAVENOUS | Status: DC | PRN
  Administered 2017-11-09: 20 mg via INTRAVENOUS

## 2017-11-09 MED ORDER — PROPOFOL 500 MG/50ML IV EMUL
INTRAVENOUS | Status: DC | PRN
  Administered 2017-11-09: 75 ug/kg/min via INTRAVENOUS

## 2017-11-09 MED ORDER — ONDANSETRON HCL 4 MG/2ML IJ SOLN: 4.0000 mg | Freq: Four times a day (QID) | INTRAMUSCULAR | Status: DC | PRN

## 2017-11-09 MED ORDER — SODIUM CHLORIDE 0.9 % IV SOLN
Freq: Once | INTRAVENOUS | Status: AC
  Administered 2017-11-09: 05:00:00 via INTRAVENOUS

## 2017-11-09 MED ORDER — PHENYLEPHRINE HCL 10 MG/ML IJ SOLN
INTRAMUSCULAR | Status: DC | PRN
  Administered 2017-11-09: 80 ug via INTRAVENOUS

## 2017-11-09 MED ORDER — LIDOCAINE HCL (CARDIAC) 20 MG/ML IV SOLN
INTRAVENOUS | Status: DC | PRN
  Administered 2017-11-09: 50 mg via INTRAVENOUS

## 2017-11-09 MED ORDER — ACETAMINOPHEN 325 MG PO TABS: 650.0000 mg | ORAL_TABLET | Freq: Four times a day (QID) | ORAL | Status: DC | PRN

## 2017-11-09 SURGICAL SUPPLY — 14 items

## 2017-11-09 NOTE — Op Note (Signed)
[facility] Patient Name: Ian [name] Procedure Date : 11/09/2017 MRN: [id number] Attending MD: Nancy Fetter Dr., MD Date of Birth: 1926-04-16 CSN: [id number] Age: 84 Admit Type: Inpatient Procedure:                Upper GI endoscopy Indications:              Hematemesis, Melena Providers:                Jeneen Rinks L. Oletta Lamas Dr., MD, Elmer Ramp. Tilden Dome, RN,                            Elspeth Cho Tech., Technician, Charolette Child,                            Technician, Gala Lewandowsky, CRNA Referring MD:              Medicines:                Monitored Anesthesia Care Complications:            No immediate complications. Estimated Blood Loss:     Estimated blood loss: none. Procedure:                Pre-Anesthesia Assessment:                           - Prior to the procedure, a History and Physical                            was performed, and patient medications and                            allergies were reviewed. The patient's tolerance of                            previous anesthesia was also reviewed. The risks                            and benefits of the procedure and the sedation                            options and risks were discussed with the patient.                            All questions were answered, and informed consent                            was obtained. Prior Anticoagulants: The patient has                            taken Plavix (clopidogrel), last dose was 1 day                            prior to procedure. ASA Grade Assessment: III - A  patient with severe systemic disease. After                            reviewing the risks and benefits, the patient was                            deemed in satisfactory condition to undergo the                            procedure.                           After obtaining informed consent, the endoscope was                            passed under direct vision. Throughout the                             procedure, the patient's blood pressure, pulse, and                            oxygen saturations were monitored continuously. The                            ([id number]) [id number] was introduced through the                            mouth, and advanced to the second part of duodenum.                            The upper GI endoscopy was accomplished without                            difficulty. The patient tolerated the procedure                            well. Scope In: Scope Out: Findings:      The esophagus was normal.      The entire examined stomach was normal. Biopsies were taken with a cold       forceps for histology.      Two non-bleeding superficial duodenal ulcers with no stigmata of       bleeding were found in the duodenal bulb. The largest lesion was 15 mm       in largest dimension. Biopsies were taken with a cold forceps for       histology. Impression:               - Normal esophagus.                           - Normal stomach. Biopsied.                           - Multiple non-bleeding duodenal ulcers with no  stigmata of bleeding. Biopsied. Moderate Sedation:      See anesthesia note, no moderate sedation. Recommendation:           - Return patient to hospital ward for ongoing care.                           - Continue present medications.                           - Resume Plavix (clopidogrel) at prior dose                            tomorrow.                           - No repeat upper endoscopy.                           - Mechanical soft diet. Procedure Code(s):        --- Professional ---                           [phone], Esophagogastroduodenoscopy, flexible,                            transoral; with biopsy, single or multiple Diagnosis Code(s):        --- Professional ---                           K26.9, Duodenal ulcer, unspecified as acute or                            chronic, without hemorrhage or  perforation                           K92.0, Hematemesis                           K92.1, Melena (includes Hematochezia) CPT copyright 2016 American Medical Association. All rights reserved. The codes documented in this report are preliminary and upon coder review may  be revised to meet current compliance requirements. Nancy Fetter Dr., MD 11/09/2017 10:05:53 AM This report has been signed electronically. Number of Addenda: 0

## 2017-11-09 NOTE — H&P (View-Only) (Signed)
[facility] Reason for consult: GI bleed Referring Physician: [facility].  PCP: Dr. Coletta Memos.  Primary GI: None Ian [name] is an 84 y.o. male.  HPI: He has a history of hypertension and dementia and was on the rehab unit.  The reason he was there is that he had a acute infarct of the right occipital lobe and had been lying on the floor had some rhabdomyolysis on this resolved.  He was transferred to the rehab unit from acute care hospital 3/6.  Yesterday he had coffee-ground emesis that was positive for blood hemoglobin dropped from 11 5-8.4.  The patient did receive 2 units of blood Plavix and aspirin placed on hold was given IV Protonix.  Was transferred to acute care hospital.  He apparently has had 5 dark stools.  He has been on aspirin and Plavix but no acid reducing medicine.  Past Medical History:  Diagnosis Date  . 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 Calculus of kidney 03/31/2013   Overview:  IMPRESSION: see ER note   . Hypertension   . Leukocytosis 10/26/2017  . Loss of weight 10/26/2017  . Multiple leg contusions, unspecified laterality, sequela 10/26/2017  . Scalp hematoma, subsequent encounter 10/26/2017  . Stroke [facility])     History reviewed. No pertinent surgical history.  Family History  Problem Relation Age of Onset  . Brain cancer Mother   . Cancer Sister     Social History:  reports that  has never smoked. he has never used smokeless tobacco. He reports that he drinks about 3.6 - 7.2 oz of alcohol per week. He reports that he does not use drugs.  Allergies: No Known Allergies  Medications; Prior to Admission medications   Medication Sig Start Date End Date Taking? Authorizing Provider  amLODipine (NORVASC) 10 MG tablet Take 10 mg by mouth daily.  01/31/16   [provider]  aspirin 81 MG chewable tablet Chew 1 tablet (81 mg total) by mouth daily. Start on 10/30/17 10/30/17   Caroline More, DO  atorvastatin (LIPITOR) 40 MG tablet  Take 1 tablet (40 mg total) by mouth daily at 6 PM. 10/29/17   Caroline More, DO  clopidogrel (PLAVIX) 75 MG tablet Take 1 tablet (75 mg total) by mouth daily. Start 10/30/17 10/30/17   Caroline More, DO  levothyroxine (SYNTHROID, LEVOTHROID) 25 MCG tablet Take 1 tablet (25 mcg total) by mouth daily before breakfast. 10/30/17   Caroline More, DO  nitroGLYCERIN (NITROSTAT) 0.4 MG SL tablet Place 1 tablet (0.4 mg total) under the tongue every 5 (five) minutes x 3 doses as needed for chest pain. 10/29/17   Caroline More, DO  polyethylene glycol powder (GLYCOLAX/MIRALAX) powder Take 17 g by mouth 2 (two) times daily as needed. Patient taking differently: Take 17 g by mouth 2 (two) times daily as needed for mild constipation.  02/01/16   Harrison Mons, PA-C   . chlorhexidine  15 mL Mouth Rinse BID  . mouth rinse  15 mL Mouth Rinse q12n4p  . [START ON 11/12/2017] pantoprazole  40 mg Intravenous Q12H   PRN Meds acetaminophen **OR** acetaminophen, ondansetron **OR** ondansetron (ZOFRAN) IV Results for orders placed or performed during the hospital encounter of 11/09/17 (from the past 48 hour(s))  MRSA PCR Screening     Status: None   Collection Time: 11/09/17  1:12 AM  Result Value Ref Range   MRSA by PCR NEGATIVE NEGATIVE    Comment:        The GeneXpert MRSA  Assay (FDA approved for NASAL specimens only), is one component of a comprehensive MRSA colonization surveillance program. It is not intended to diagnose MRSA infection nor to guide or monitor treatment for MRSA infections. Performed at [facility], [place] [address], [place], [place] [address]   ABO/Rh     Status: None (Preliminary result)   Collection Time: 11/09/17  1:45 AM  Result Value Ref Range   ABO/RH(D)      O POS Performed at [facility] [address], [place], [place] [address]   Magnesium     Status: Abnormal   Collection Time: 11/09/17  1:59 AM  Result Value Ref Range   Magnesium 1.0 (L) 1.7 - 2.4 mg/dL     Comment: Performed at [facility] [address] [place], [place] [address]  Phosphorus     Status: None   Collection Time: 11/09/17  1:59 AM  Result Value Ref Range   Phosphorus 3.0 2.5 - 4.6 mg/dL    Comment: Performed at [facility] [address] [place], [place] [address]  TSH     Status: Abnormal   Collection Time: 11/09/17  1:59 AM  Result Value Ref Range   TSH 10.491 (H) 0.350 - 4.500 uIU/mL    Comment: Performed by a 3rd Generation assay with a functional sensitivity of <=0.01 uIU/mL. Performed at [facility], [place] [address] [place], [place] [address]   Comprehensive metabolic panel     Status: Abnormal   Collection Time: 11/09/17  1:59 AM  Result Value Ref Range   Sodium 137 135 - 145 mmol/L   Potassium 4.0 3.5 - 5.1 mmol/L   Chloride 107 101 - 111 mmol/L   CO2 18 (L) 22 - 32 mmol/L   Glucose, Bld 118 (H) 65 - 99 mg/dL   BUN 70 (H) 6 - 20 mg/dL   Creatinine, Ser 1.09 0.61 - 1.24 mg/dL   Calcium 9.9 8.9 - 10.3 mg/dL   Total Protein 5.6 (L) 6.5 - 8.1 g/dL   Albumin 2.6 (L) 3.5 - 5.0 g/dL   AST 24 15 - 41 U/L   ALT 17 17 - 63 U/L   Alkaline Phosphatase 52 38 - 126 U/L   Total Bilirubin 1.3 (H) 0.3 - 1.2 mg/dL   GFR calc non Af Amer 58 (L) >60 mL/min   GFR calc Af Amer >60 >60 mL/min    Comment: (NOTE) The eGFR has been calculated using the CKD EPI equation. This calculation has not been validated in all clinical situations. eGFR's persistently <60 mL/min signify possible Chronic Kidney Disease.    Anion gap 12 5 - 15    Comment: Performed at [facility] [address], [place], [place] [address]  Troponin I     Status: None   Collection Time: 11/09/17  1:59 AM  Result Value Ref Range   Troponin I <0.03 <0.03 ng/mL    Comment: Performed at [facility] [address], [place], [place] [address]  Protime-INR     Status: Abnormal   Collection Time: 11/09/17  1:59 AM  Result Value Ref Range   Prothrombin Time 15.6 (H) 11.4 -  15.2 seconds   INR 1.26     Comment: Performed at [facility] [address] [place],  [address]    No results found.             Blood pressure 123/89, pulse 93, temperature (!)  96.8 F (36 C), temperature source Rectal, resp. rate (!) 26, height '5\' 11"'$  (1.803 m), weight 68.8 kg (151 lb 10.8 oz), SpO2 100 %.  Physical exam:   General--patient restrained and is pleasant somewhat confused ENT--nonicteric  Heart--regular rate and rhythm without murmurs or gallops Lungs--clear Abdomen--soft and completely nontender Psych--as above   Assessment: 1.  Hematemesis/melena.  Probable ulcer worsened by aspirin and Plavix needs EGD 2.  Recent stroke on the rehab unit.  Needs to be back on antiplatelet agents as soon as possible  Plan: We discussed with wife and we will go ahead and plan EGD later today.   Nancy Fetter 11/09/2017, 8:53 AM   This note was created using voice recognition software and minor errors may Have occurred unintentionally. Pager: [phone] If no answer or after hours call [phone]

## 2017-11-09 NOTE — Discharge Instructions (Signed)
Esophagogastroduodenoscopy, Care After °Refer to this sheet in the next few weeks. These instructions provide you with information about caring for yourself after your procedure. Your health care provider may also give you more specific instructions. Your treatment has been planned according to current medical practices, but problems sometimes occur. Call your health care provider if you have any problems or questions after your procedure. °What can I expect after the procedure? °After the procedure, it is common to have: °· A sore throat. °· Nausea. °· Bloating. °· Dizziness. °· Fatigue. ° °Follow these instructions at home: °· Do not eat or drink anything until the numbing medicine (local anesthetic) has worn off and your gag reflex has returned. You will know that the local anesthetic has worn off when you can swallow comfortably. °· Do not drive for 24 hours if you received a medicine to help you relax (sedative). °· If your health care provider took a tissue sample for testing during the procedure, make sure to get your test results. This is your responsibility. Ask your health care provider or the department performing the test when your results will be ready. °· Keep all follow-up visits as told by your health care provider. This is important. °Contact a health care provider if: °· You cannot stop coughing. °· You are not urinating. °· You are urinating less than usual. °Get help right away if: °· You have trouble swallowing. °· You cannot eat or drink. °· You have throat or chest pain that gets worse. °· You are dizzy or light-headed. °· You faint. °· You have nausea or vomiting. °· You have chills. °· You have a fever. °· You have severe abdominal pain. °· You have black, tarry, or bloody stools. °This information is not intended to replace advice given to you by your health care provider. Make sure you discuss any questions you have with your health care provider. °Document Released: 07/29/2012 Document  Revised: 01/18/2016 Document Reviewed: 07/06/2015 °Elsevier Interactive Patient Education © 2018 [employer] ° °

## 2017-11-09 NOTE — Progress Notes (Signed)
Patient arrived to 6N24, VSS, A&O to self and place, IV intact and infusing.  Noted to have bruising and edema present on RUE.  Wife at bedside.  Patient and wife oriented to room and equipment.  Will continue to monitor.

## 2017-11-09 NOTE — Anesthesia Procedure Notes (Signed)
Procedure Name: MAC Date/Time: 11/09/2017 9:44 AM Performed by: Oletta Lamas, CRNA Pre-anesthesia Checklist: Patient identified, Emergency Drugs available, Suction available, Patient being monitored and Timeout performed Patient Re-evaluated:Patient Re-evaluated prior to induction Oxygen Delivery Method: Nasal cannula

## 2017-11-09 NOTE — Progress Notes (Addendum)
Received call from [facility] this morning and [facility] will resume care of patient, as he was recently discharge from our service.   I have evaluated Ian [name] this afternoon following his AM endoscopy and he feels well.  Briefly, patient is an 84 y/o male admitted for concern for GI bleed.  CBC on admission 8.4 from baseline of 11-12 and transfused 2 units.  Post-transfusion Hgb is stable at 10.7.  EGD was performed this morning and patient tolerated well.   Two non-bleeding superficial duodenal ulcers with no stigmata of bleeding were found in the duodenal bulb. The largest lesion was 15 mm in largest dimension. Biopsies were taken with a cold forceps for histology.  He denies blood in stool, dizziness, fatigue, or shortness of breath.  Resting comfortably at this time.  Pt stable for transfer to med-surg floor and will place order.  Will continue to monitor Hgb on CBC and discuss further recommendations with GI.    Lovenia Kim, MD  [place], PGY-2

## 2017-11-09 NOTE — Anesthesia Postprocedure Evaluation (Signed)
Anesthesia Post Note  Patient: Ian [name]  Procedure(s) Performed: ESOPHAGOGASTRODUODENOSCOPY (EGD) WITH PROPOFOL (N/A )     Patient location during evaluation: Endoscopy Anesthesia Type: MAC Level of consciousness: awake Pain management: pain level controlled Vital Signs Assessment: post-procedure vital signs reviewed and stable Respiratory status: spontaneous breathing, nonlabored ventilation, respiratory function stable and patient connected to nasal cannula oxygen Cardiovascular status: stable and blood pressure returned to baseline Postop Assessment: no apparent nausea or vomiting Anesthetic complications: no    Last Vitals:  Vitals:   11/09/17 1015 11/09/17 1019  BP: (!) 86/48 120/78  Pulse: 81 83  Resp: (!) 28 (!) 23  Temp:    SpO2: 98% 100%    Last Pain:  Vitals:   11/09/17 1015  TempSrc:   PainSc: 0-No pain                 Oaklynn Stierwalt

## 2017-11-09 NOTE — Progress Notes (Signed)
Patient's wife was called again. She answered & was given an update on the events leading up to requesting consent for a blood transfusion.She verbalized understanding. Consent was given & verified by 2 RNs. Consent was placed in patients chart.

## 2017-11-09 NOTE — Progress Notes (Signed)
Patient's wife was called at both listed telephone numbers to update & receive consent for possible blood transfusion. There was no answer, a message was left at both numbers for a call back.

## 2017-11-09 NOTE — Anesthesia Preprocedure Evaluation (Signed)
Anesthesia Evaluation  Patient identified by MRN, date of birth, ID band Patient confused    Reviewed: Allergy & Precautions, NPO status , Patient's Chart, lab work & pertinent test results  History of Anesthesia Complications Negative for: history of anesthetic complications  Airway Mallampati: III  TM Distance: >3 FB Neck ROM: Full    Dental  (+) Teeth Intact   Pulmonary neg pulmonary ROS,    breath sounds clear to auscultation       Cardiovascular hypertension, Pt. on medications and Pt. on home beta blockers + Peripheral Vascular Disease   Rhythm:Regular     Neuro/Psych PSYCHIATRIC DISORDERS Dementia  Neuromuscular disease CVA    GI/Hepatic Neg liver ROS, ? GI bleed   Endo/Other  Hypothyroidism   Renal/GU Renal InsufficiencyRenal disease     Musculoskeletal   Abdominal   Peds  Hematology  (+) anemia ,   Anesthesia Other Findings   Reproductive/Obstetrics                             Anesthesia Physical Anesthesia Plan  ASA: III  Anesthesia Plan: MAC   Post-op Pain Management:    Induction: Intravenous  PONV Risk Score and Plan: 1 and Treatment may vary due to age or medical condition  Airway Management Planned: Nasal Cannula  Additional Equipment:   Intra-op Plan:   Post-operative Plan:   Informed Consent: I have reviewed the patients History and Physical, chart, labs and discussed the procedure including the risks, benefits and alternatives for the proposed anesthesia with the patient or authorized representative who has indicated his/her understanding and acceptance.   Dental advisory given  Plan Discussed with: CRNA and Surgeon  Anesthesia Plan Comments:         Anesthesia Quick Evaluation

## 2017-11-09 NOTE — Progress Notes (Signed)
Discussed EGD with pt and wife by phone will do later today. Will get verbal consent from her by phone.

## 2017-11-09 NOTE — Transfer of Care (Signed)
Immediate Anesthesia Transfer of Care Note  Patient: Ian [name]  Procedure(s) Performed: ESOPHAGOGASTRODUODENOSCOPY (EGD) WITH PROPOFOL (N/A )  Patient Location:Endo  Anesthesia Type:MAC  Level of Consciousness: drowsy and responds to stimulation  Airway & Oxygen Therapy: Patient Spontanous Breathing and Patient connected to nasal cannula oxygen  Post-op Assessment: Report given to RN and Post -op Vital signs reviewed and stable  Post vital signs: Reviewed and stable  Last Vitals:  Vitals:   11/09/17 0919 11/09/17 1003  BP: 122/77 (!) (P) 92/58  Pulse: 91 (P) 84  Resp: 17 (!) (P) 32  Temp: (!) 36.4 C   SpO2: 100% (P) 98%    Last Pain:  Vitals:   11/09/17 0919  TempSrc: Oral         Complications: No apparent anesthesia complications

## 2017-11-09 NOTE — Consult Note (Signed)
[facility] CONS[facility] Reason for consult: GI bleed Referring Physician: [facility] hospitalist.  PCP: Dr. Coletta Memos.  Primary GI: None Ian [name] is an 84 y.o. male.  HPI: He has a history of hypertension and dementia and was on the rehab unit.  The reason he was there is that he had a acute infarct of the right occipital lobe and had been lying on the floor had some rhabdomyolysis on this resolved.  He was transferred to the rehab unit from acute care hospital 3/6.  Yesterday he had coffee-ground emesis that was positive for blood hemoglobin dropped from 11 5-8.4.  The patient did receive 2 units of blood Plavix and aspirin placed on hold was given IV Protonix.  Was transferred to acute care hospital.  He apparently has had 5 dark stools.  He has been on aspirin and Plavix but no acid reducing medicine.  Past Medical History:  Diagnosis Date  . 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 Calculus of kidney 03/31/2013   Overview:  IMPRESSION: see ER note   . Hypertension   . Leukocytosis 10/26/2017  . Loss of weight 10/26/2017  . Multiple leg contusions, unspecified laterality, sequela 10/26/2017  . Scalp hematoma, subsequent encounter 10/26/2017  . Stroke [facility])     History reviewed. No pertinent surgical history.  Family History  Problem Relation Age of Onset  . Brain cancer Mother   . Cancer Sister     Social History:  reports that  has never smoked. he has never used smokeless tobacco. He reports that he drinks about 3.6 - 7.2 oz of alcohol per week. He reports that he does not use drugs.  Allergies: No Known Allergies  Medications; Prior to Admission medications   Medication Sig Start Date End Date Taking? Authorizing Provider  amLODipine (NORVASC) 10 MG tablet Take 10 mg by mouth daily.  01/31/16   [provider]  aspirin 81 MG chewable tablet Chew 1 tablet (81 mg total) by mouth daily. Start on 10/30/17 10/30/17   Caroline More, DO  atorvastatin (LIPITOR) 40 MG tablet  Take 1 tablet (40 mg total) by mouth daily at 6 PM. 10/29/17   Caroline More, DO  clopidogrel (PLAVIX) 75 MG tablet Take 1 tablet (75 mg total) by mouth daily. Start 10/30/17 10/30/17   Caroline More, DO  levothyroxine (SYNTHROID, LEVOTHROID) 25 MCG tablet Take 1 tablet (25 mcg total) by mouth daily before breakfast. 10/30/17   Caroline More, DO  nitroGLYCERIN (NITROSTAT) 0.4 MG SL tablet Place 1 tablet (0.4 mg total) under the tongue every 5 (five) minutes x 3 doses as needed for chest pain. 10/29/17   Caroline More, DO  polyethylene glycol powder (GLYCOLAX/MIRALAX) powder Take 17 g by mouth 2 (two) times daily as needed. Patient taking differently: Take 17 g by mouth 2 (two) times daily as needed for mild constipation.  02/01/16   Harrison Mons, PA-C   . chlorhexidine  15 mL Mouth Rinse BID  . mouth rinse  15 mL Mouth Rinse q12n4p  . [START ON 11/12/2017] pantoprazole  40 mg Intravenous Q12H   PRN Meds acetaminophen **OR** acetaminophen, ondansetron **OR** ondansetron (ZOFRAN) IV Results for orders placed or performed during the hospital encounter of 11/09/17 (from the past 48 hour(s))  MRSA PCR Screening     Status: None   Collection Time: 11/09/17  1:12 AM  Result Value Ref Range   MRSA by PCR NEGATIVE NEGATIVE    Comment:        The GeneXpert MRSA  Assay (FDA approved for NASAL specimens only), is one component of a comprehensive MRSA colonization surveillance program. It is not intended to diagnose MRSA infection nor to guide or monitor treatment for MRSA infections. Performed at [facility], [place] [address], [place], [place] [address]   ABO/Rh     Status: None (Preliminary result)   Collection Time: 11/09/17  1:45 AM  Result Value Ref Range   ABO/RH(D)      O POS Performed at [facility] [address], [place], [place] [address]   Magnesium     Status: Abnormal   Collection Time: 11/09/17  1:59 AM  Result Value Ref Range   Magnesium 1.0 (L) 1.7 - 2.4 mg/dL     Comment: Performed at [facility] [address] [place], [place] [address]  Phosphorus     Status: None   Collection Time: 11/09/17  1:59 AM  Result Value Ref Range   Phosphorus 3.0 2.5 - 4.6 mg/dL    Comment: Performed at [facility] [address] [place], [place] [address]  TSH     Status: Abnormal   Collection Time: 11/09/17  1:59 AM  Result Value Ref Range   TSH 10.491 (H) 0.350 - 4.500 uIU/mL    Comment: Performed by a 3rd Generation assay with a functional sensitivity of <=0.01 uIU/mL. Performed at [facility], [place] [address], [place], [place] [address]   Comprehensive metabolic panel     Status: Abnormal   Collection Time: 11/09/17  1:59 AM  Result Value Ref Range   Sodium 137 135 - 145 mmol/L   Potassium 4.0 3.5 - 5.1 mmol/L   Chloride 107 101 - 111 mmol/L   CO2 18 (L) 22 - 32 mmol/L   Glucose, Bld 118 (H) 65 - 99 mg/dL   BUN 70 (H) 6 - 20 mg/dL   Creatinine, Ser 1.09 0.61 - 1.24 mg/dL   Calcium 9.9 8.9 - 10.3 mg/dL   Total Protein 5.6 (L) 6.5 - 8.1 g/dL   Albumin 2.6 (L) 3.5 - 5.0 g/dL   AST 24 15 - 41 U/L   ALT 17 17 - 63 U/L   Alkaline Phosphatase 52 38 - 126 U/L   Total Bilirubin 1.3 (H) 0.3 - 1.2 mg/dL   GFR calc non Af Amer 58 (L) >60 mL/min   GFR calc Af Amer >60 >60 mL/min    Comment: (NOTE) The eGFR has been calculated using the CKD EPI equation. This calculation has not been validated in all clinical situations. eGFR's persistently <60 mL/min signify possible Chronic Kidney Disease.    Anion gap 12 5 - 15    Comment: Performed at [facility] [address], [place], [place] [address]  Troponin I     Status: None   Collection Time: 11/09/17  1:59 AM  Result Value Ref Range   Troponin I <0.03 <0.03 ng/mL    Comment: Performed at [facility] [address], [place], [place] [address]  Protime-INR     Status: Abnormal   Collection Time: 11/09/17  1:59 AM  Result Value Ref Range   Prothrombin Time 15.6 (H) 11.4 -  15.2 seconds   INR 1.26     Comment: Performed at [facility] [address] [place], [place] [address]    No results found.             Blood pressure 123/89, pulse 93, temperature (!)  96.8 F (36 C), temperature source Rectal, resp. rate (!) 26, height '5\' 11"'$  (1.803 m), weight 68.8 kg (151 lb 10.8 oz), SpO2 100 %.  Physical exam:   General--patient restrained and is pleasant somewhat confused ENT--nonicteric  Heart--regular rate and rhythm without murmurs or gallops Lungs--clear Abdomen--soft and completely nontender Psych--as above   Assessment: 1.  Hematemesis/melena.  Probable ulcer worsened by aspirin and Plavix needs EGD 2.  Recent stroke on the rehab unit.  Needs to be back on antiplatelet agents as soon as possible  Plan: We discussed with wife and we will go ahead and plan EGD later today.   Nancy Fetter 11/09/2017, 8:53 AM   This note was created using voice recognition software and minor errors may Have occurred unintentionally. Pager: [phone] If no answer or after hours call [phone]

## 2017-11-09 NOTE — H&P (Signed)
Ian [name] ZOX:[id number] DOB: 28-Apr-1926 DOA: 11/09/2017     PCP: Bernerd Limbo, MD   Outpatient Specialists: none   Patient coming from:  home Lives  With family   Chief Complaint:  Coffee ground emesis, melena HPI: Ian [name] is a 84 y.o. male with medical history significant of hypertension and dementia.    Presented with   Coffee ground emesis today with melanotic stools, generalized fatigue  Hg noted to dropped from 11.5 yesterday to 8.4 today, Have had at least 5 dark melanotic stools.  Noted to be hypotensive and tachycardic. Recently started on aspirin and Plavix for recent CVA.  [facility] was called for transfer to medical service.  Denies any prior hx of the same. Reports drinking 2-3 drinks a day.  Presented 10/25/2017 with altered mental status as well as reported fall possible syncopal event. Patient was down for approximately 16 hours before being found. CT/MRI the brainreviewed, showing right occipital infarct. Per report,12 mm focus of reduced diffusion in the right occipital lobe, acute early subacute infarct. No hemorrhage or mass effect. X-ray of hip and shoulder negative. Mildly elevated troponin 0.19 felt to be related to demand ischemia. CT angiogram head and neck showed no large vessel occlusion or significant stenosis. Carotid Dopplers with no evidence of stenosis bilaterally. . BUN 24, creatinine 1.32, CK elevated5905, UDS negative, lactic acid 5.64. Patient placed on IV fluids. Echocardiogram with ejection fraction of 81% grade 1 diastolic dysfunction. EEG negative for seizure. Neurology follow-up placed on aspirin and Plavix 3 weeks then Plavix alone. Plan 30 day monitor on discharge. CK improved 825-377.   Full liquid diet and advanced as tolerated. Physical and occupational therapy evaluations completed 10/27/2017 with recommendations of physical medicine rehabilitation consult. Patient was admitted for a comprehensive rehabilitation program      Significant initial  Findings: Abnormal Labs Reviewed - No data to display    Na 136 K 3.8 BUN up 64 from 18 yesterday.  Cr     Up from baseline see below Lab Results  Component Value Date   CREATININE 1.04 11/08/2017   CREATININE 0.81 11/07/2017   CREATININE 0.83 11/06/2017   GFR: Estimated Creatinine Clearance: 48.4 mL/min (by C-G formula based on SCr of 1.04 mg/dL).  WBC  14.2 up from prior 12.3  HG/HCT dropped from yesterday 11.5    Component Value Date/Time   HGB 8.4 (L) 11/08/2017 2251   HCT 24.4 (L) 11/08/2017 2251    Lactic acid:     UA   ordered    ECG:  Personally reviewed by me showing: HR : 93 Rhythm:  SR with first degree AV block   no evidence of ischemic changes [id number]     Hospitalist was called for admission for Likely upper Gi bleed  Regarding pertinent Chronic problems: HX of HTN on norvasc hx of ET OH heavy use/abuse.   Review of Systems:    Pertinent positives include: confusion, melena, hematemesis  Constitutional:  No weight loss, night sweats, Fevers, chills, fatigue, weight loss  HEENT:  No headaches, Difficulty swallowing,Tooth/dental problems,Sore throat,  No sneezing, itching, ear ache, nasal congestion, post nasal drip,  Cardio-vascular:  No chest pain, Orthopnea, PND, anasarca, dizziness, palpitations.no Bilateral lower extremity swelling  GI:  No heartburn, indigestion, abdominal pain, nausea, vomiting, diarrhea, change in bowel habits, loss of appetite, melena, blood in stool,  Resp:  no shortness of breath at rest. No dyspnea on exertion, No excess mucus, no productive cough, No non-productive cough, No coughing  up of blood.No change in color of mucus.No wheezing. Skin:  no rash or lesions. No jaundice GU:  no dysuria, change in color of urine, no urgency or frequency. No straining to urinate.  No flank pain.  Musculoskeletal:  No joint pain or no joint swelling. No decreased range of motion. No back pain.  Psych:   No change in mood or affect. No depression or anxiety. No memory loss.  Neuro: no localizing neurological complaints, no tingling, no weakness, no double vision, no gait abnormality, no slurred speech, no confusion  As per HPI otherwise 10 point review of systems negative.   Past Medical History: There were no vitals taken for this visit.EDMEDS Past Medical History:  Diagnosis Date  . 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 Calculus of kidney 03/31/2013   Overview:  IMPRESSION: see ER note   . Hypertension   . Leukocytosis 10/26/2017  . Loss of weight 10/26/2017  . Multiple leg contusions, unspecified laterality, sequela 10/26/2017  . Scalp hematoma, subsequent encounter 10/26/2017  . Stroke [facility])      Social History:  Ambulatory with walker     No past surgical history on file.     reports that  has never smoked. he has never used smokeless tobacco. He reports that he drinks about 3.6 - 7.2 oz of alcohol per week. He reports that he does not use drugs.     Family History:  Family History  Problem Relation Age of Onset  . Brain cancer Mother   . Cancer Sister    Allergies:   No Known Allergies Prior to Admission medications   Medication Sig Start Date End Date Taking? Authorizing Provider  amLODipine (NORVASC) 10 MG tablet Take 10 mg by mouth daily.  01/31/16   [provider]  aspirin 81 MG chewable tablet Chew 1 tablet (81 mg total) by mouth daily. Start on 10/30/17 10/30/17   Caroline More, DO  atorvastatin (LIPITOR) 40 MG tablet Take 1 tablet (40 mg total) by mouth daily at 6 PM. 10/29/17   Caroline More, DO  clopidogrel (PLAVIX) 75 MG tablet Take 1 tablet (75 mg total) by mouth daily. Start 10/30/17 10/30/17   Caroline More, DO  levothyroxine (SYNTHROID, LEVOTHROID) 25 MCG tablet Take 1 tablet (25 mcg total) by mouth daily before breakfast. 10/30/17   Caroline More, DO  nitroGLYCERIN (NITROSTAT) 0.4 MG SL tablet Place 1 tablet (0.4 mg total) under the tongue  every 5 (five) minutes x 3 doses as needed for chest pain. 10/29/17   Caroline More, DO  polyethylene glycol powder (GLYCOLAX/MIRALAX) powder Take 17 g by mouth 2 (two) times daily as needed. Patient taking differently: Take 17 g by mouth 2 (two) times daily as needed for mild constipation.  02/01/16   Harrison Mons, PA-C    Physical Exam: Prior to Admission medications   Medication Sig Start Date End Date Taking? Authorizing Provider  amLODipine (NORVASC) 10 MG tablet Take 10 mg by mouth daily.  01/31/16   [provider]  aspirin 81 MG chewable tablet Chew 1 tablet (81 mg total) by mouth daily. Start on 10/30/17 10/30/17   Caroline More, DO  atorvastatin (LIPITOR) 40 MG tablet Take 1 tablet (40 mg total) by mouth daily at 6 PM. 10/29/17   Caroline More, DO  clopidogrel (PLAVIX) 75 MG tablet Take 1 tablet (75 mg total) by mouth daily. Start 10/30/17 10/30/17   Caroline More, DO  levothyroxine (SYNTHROID, LEVOTHROID) 25 MCG tablet Take 1 tablet (25  mcg total) by mouth daily before breakfast. 10/30/17   Caroline More, DO  nitroGLYCERIN (NITROSTAT) 0.4 MG SL tablet Place 1 tablet (0.4 mg total) under the tongue every 5 (five) minutes x 3 doses as needed for chest pain. 10/29/17   Caroline More, DO  polyethylene glycol powder (GLYCOLAX/MIRALAX) powder Take 17 g by mouth 2 (two) times daily as needed. Patient taking differently: Take 17 g by mouth 2 (two) times daily as needed for mild constipation.  02/01/16   Harrison Mons, PA-C    1. General:  in No Acute distress   Chronically ill  -appearing 2. Psychological: Alert and  Oriented to place not situation 3. Head/ENT:     Dry Mucous Membranes                          Head Non traumatic, neck supple                           Poor Dentition 4. SKIN:   decreased Skin turgor,  Skin clean Dry and intact no rash 5. Heart: Regular rate and rhythm no  Murmur, no Rub or gallop 6. Lungs:   no wheezes or crackles  distant 7. Abdomen: Soft,   non-tender, Non distended bowel sounds present 8. Lower extremities: no clubbing, cyanosis, or edema 9. Neurologically Grossly intact, moving all 4 extremities equally  10. MSK: Normal range of motion   No data found.BMIP Recent Labs  Lab 11/06/17 0521 11/07/17 0643 11/08/17 2251  WBC 13.3* 12.3* 14.2*  HGB 12.1* 11.5* 8.4*  HCT 35.4* 34.1* 24.4*  MCV 97.3 96.9 97.2  PLT 323 331 678   Basic Metabolic Panel: Recent Labs  Lab 11/06/17 0521 11/07/17 0643 11/08/17 2251  NA 135 135 136  K 3.2* 3.4* 3.8  CL 103 103 107  CO2 24 24 16*  GLUCOSE 94 94 127*  BUN 15 18 64*  CREATININE 0.83 0.81 1.04  CALCIUM 10.0 10.2 9.8  LABRCNTIP(wbc:5,neutroabs:5,hgb:5,hct:5,mcv:5,plt:5) Recent Labs  Lab 11/06/17 0521 11/07/17 0643 11/08/17 2251  NA 135 135 136  K 3.2* 3.4* 3.8  CL 103 103 107  CO2 24 24 16*  GLUCOSE 94 94 127*  BUN 15 18 64*  CREATININE 0.83 0.81 1.04  CALCIUM 10.0 10.2 9.8  LABRCNTIP(ast:5,ALT:5,alkphos:5,bilitot:5,prot:5,albumin:5)No results for input(s): LIPASE, AMYLASE in the last 168 hours. No results for input(s): AMMONIA in the last 168 hours.   Urine analysis:    Component Value Date/Time   COLORURINE AMBER (A) 10/25/2017 1513   APPEARANCEUR CLOUDY (A) 10/25/2017 1513   LABSPEC 1.024 10/25/2017 1513   PHURINE 5.0 10/25/2017 1513   GLUCOSEU NEGATIVE 10/25/2017 1513   HGBUR MODERATE (A) 10/25/2017 1513   BILIRUBINUR NEGATIVE 10/25/2017 1513   KETONESUR 5 (A) 10/25/2017 1513   PROTEINUR 100 (A) 10/25/2017 1513   UROBILINOGEN 1.0 01/25/2010 1355   NITRITE NEGATIVE 10/25/2017 1513   LEUKOCYTESUR NEGATIVE 10/25/2017 1513   Sepsis Labs:   Lab Results  Component Value Date   HGBA1C 4.8 10/25/2017    Estimated Creatinine Clearance: 48.4 mL/min (by C-G formula based on SCr of 1.04 mg/dL).    There were no vitals filed for this visit.   Cultures:    Component Value Date/Time   SDES BLOOD LEFT FOREARM 10/25/2017 1710   SPECREQUEST IN PEDIATRIC  BOTTLE Blood Culture adequate volume 10/25/2017 1710   CULT  10/25/2017 1710    NO GROWTH 5 DAYS Performed at  [facility], [place] [address], [place], [place] [address]    REPTSTATUS 10/30/2017 FINAL 10/25/2017 1710     Radiological Exams on Admission: No results found.  Chart has been reviewed    Assessment/Plan  84 y.o. male with medical history significant of hypertension and dementia.      Admitted for Hematemesis, melena likely upper Gi bleed  Present on Admission: . GI bleed due to NSAIDs -  - Glasgow Blatchford score BUN >18.2  , Hg <07E   , systolic BP <675  HR >449   , melena  ,   CHF   >1 Justifies admission and aggressive management      Modifying risk factors include:     NSAIDS use  anticoagulation,  alcohol abuse         -    AIMS 65 = Alb <3,    Mental status change, SBP <90 (0-4) TOTAL of 3                Worrisome       - hemodynamic instability present      -  Admit to stepdown given above    -   spoke to gastroenterology ( [facility],  ) they will see patient in a.m. appreciate their consult   - serial CBC.    - Monitor for any recurrence,  evidence of hemodynamic instability or significant blood loss  - Transfuse 2 units now and then as needed for hemoglobin below 7 or evidence of life-threatening bleeding  - Establish at least 2 PIV and fluid resuscitate   -   nothing by mouth post midnight,   -  administer Protonix  drip    . Benign essential HTN - soft BP hold norvasc . Cerebral infarction [facility]), right occipital lobe - hold aspirin and Plavix given acute Gi bleed . Dementia without behavioral disturbance - expect some degree of sundowning, moniotor  . Diastolic dysfunction - avoid fluid overload if able but for now given hypotension will rehydrate.    . Acute blood loss anemia - transfuse 2 units now and repeat CBG     hx of Et Oh use - no evidence of withdrawal at this point has been hospitalized  Other plan as per orders.  DVT prophylaxis:   SCD    Code Status:  FULL CODE   as per reccords  Family Communication:   Family not at  Bedside    Disposition Plan:     likely will need placement for rehabilitation                                                 Would benefit from PT/OT eval prior to DC                        Consults called: [facility] Oletta Lamas   Admission status:    inpatient      Level of care        SDU      I have spent a total of 120 min on this admission  extra time was spent to discuss case with consultants, as well as provide critical care at bed side total of 45 min of critical care was provided at bedside. Involving repeated reevaluation,   Shaely Gadberry 11/09/2017, 1:33 AM  [facility]  Pager [phone]   after 2 AM please page floor coverage PA If 7AM-7PM, please contact the day team taking care of the patient  [email or web address]  Password [email or web address]

## 2017-11-09 NOTE — Interval H&P Note (Signed)
History and Physical Interval Note:  11/09/2017 9:37 AM  Lianne Bushy  has presented today for surgery, with the diagnosis of GI bleed  The various methods of treatment have been discussed with the patient and family. After consideration of risks, benefits and other options for treatment, the patient has consented to  Procedure(s): ESOPHAGOGASTRODUODENOSCOPY (EGD) WITH PROPOFOL (N/A) as a surgical intervention .  The patient's history has been reviewed, patient examined, no change in status, stable for surgery.  I have reviewed the patient's chart and labs.  Questions were answered to the patient's satisfaction.     Nancy Fetter

## 2017-11-09 NOTE — Significant Event (Signed)
Rapid Response Event Note  Overview: Called by Rehab staff about patient having bloody emesis and rectal bleeding  Initial Focused Assessment: Upon arrival, patient was alert, agitated at times (baseline dementia and has been confused and agitated per staff).  Patient was pale, skin was cool to touch, + pulses, lung sounds were clear in the uppers and diminished in the lowers, at times breathing was labored, but not sustained. HR in the 100-110 range, SBP 90-100s/ 60-70s, MAPs > 60, initially when I arrived, oxygen saturation was 82%, patient was placed on NRB 15L, oxygen saturations improved, and patient's breathing improved, oxygen was weaned off and sats maintained > 98% on RA. Not in acute distress.  Interventions: -- RN had already spoken to Rehab MD, received orders for STAT CBC and BMP, MIVF 100cc/hr, IV Protonix, and EKG -- Unable to obtain to EKG due to lack of cooperation from patient -- 2nd PIV started -- H/H resulted to 8.4/24.4 down from 11.5/34.1  Plan of Care (if not transferred): -- [facility] MD consulted, I was called away to a medical emergency, as I was leaving [place] MD arrived, plan to move patient to 4N ICU as SDU overflow.  Event Summary:   at    Call Time 2310 Arrival Time 2312 End Time 0012  Ian [name] R

## 2017-11-10 ENCOUNTER — Inpatient Hospital Stay (HOSPITAL_COMMUNITY): Payer: Medicare Other | Admitting: Occupational Therapy

## 2017-11-10 ENCOUNTER — Inpatient Hospital Stay (HOSPITAL_COMMUNITY): Payer: Medicare Other | Admitting: Physical Therapy

## 2017-11-10 ENCOUNTER — Inpatient Hospital Stay (HOSPITAL_COMMUNITY): Payer: Medicare Other | Admitting: Speech Pathology

## 2017-11-10 LAB — TYPE AND SCREEN
ABO/RH(D): O POS
Antibody Screen: NEGATIVE
Unit division: 0
Unit division: 0

## 2017-11-10 LAB — BPAM RBC
Blood Product Expiration Date: 201904092359
Blood Product Expiration Date: 201904092359
ISSUE DATE / TIME: 201903170438
ISSUE DATE / TIME: 201903170652
Unit Type and Rh: 5100
Unit Type and Rh: 5100

## 2017-11-10 LAB — CBC
HCT: 26.3 % — ABNORMAL LOW (ref 39.0–52.0)
Hemoglobin: 9.2 g/dL — ABNORMAL LOW (ref 13.0–17.0)
MCH: 32.3 pg (ref 26.0–34.0)
MCHC: 35 g/dL (ref 30.0–36.0)
MCV: 92.3 fL (ref 78.0–100.0)
Platelets: 220 10*3/uL (ref 150–400)
RBC: 2.85 MIL/uL — ABNORMAL LOW (ref 4.22–5.81)
RDW: 16.1 % — ABNORMAL HIGH (ref 11.5–15.5)
WBC: 15.8 10*3/uL — ABNORMAL HIGH (ref 4.0–10.5)

## 2017-11-10 LAB — GLUCOSE, CAPILLARY: Glucose-Capillary: 97 mg/dL (ref 65–99)

## 2017-11-10 LAB — SAVE SMEAR

## 2017-11-10 MED ORDER — MAGNESIUM SULFATE 2 GM/50ML IV SOLN
2.0000 g | Freq: Once | INTRAVENOUS | Status: AC
  Administered 2017-11-10: 2 g via INTRAVENOUS
  Filled 2017-11-10: qty 50

## 2017-11-10 MED ORDER — CLOPIDOGREL BISULFATE 75 MG PO TABS
75.0000 mg | ORAL_TABLET | Freq: Every day | ORAL | Status: DC
  Administered 2017-11-10 – 2017-11-11 (×2): 75 mg via ORAL
  Filled 2017-11-10 (×2): qty 1

## 2017-11-10 MED ORDER — ATORVASTATIN CALCIUM 40 MG PO TABS
40.0000 mg | ORAL_TABLET | Freq: Every day | ORAL | Status: DC
  Administered 2017-11-10: 40 mg via ORAL
  Filled 2017-11-10: qty 1

## 2017-11-10 MED ORDER — ASPIRIN 81 MG PO CHEW
81.0000 mg | CHEWABLE_TABLET | Freq: Every day | ORAL | Status: DC
  Administered 2017-11-10 – 2017-11-11 (×2): 81 mg via ORAL
  Filled 2017-11-10 (×2): qty 1

## 2017-11-10 MED ORDER — PANTOPRAZOLE SODIUM 40 MG PO TBEC
40.0000 mg | DELAYED_RELEASE_TABLET | Freq: Two times a day (BID) | ORAL | Status: DC
  Administered 2017-11-10 – 2017-11-11 (×2): 40 mg via ORAL
  Filled 2017-11-10 (×2): qty 1

## 2017-11-10 MED ORDER — LEVOTHYROXINE SODIUM 50 MCG PO TABS
50.0000 ug | ORAL_TABLET | Freq: Every day | ORAL | Status: DC
  Administered 2017-11-10 – 2017-11-11 (×2): 50 ug via ORAL
  Filled 2017-11-10 (×2): qty 1

## 2017-11-10 NOTE — Discharge Summary (Signed)
Discharge summary job # [id number]

## 2017-11-10 NOTE — Discharge Summary (Deleted)
  The note originally documented on this encounter has been moved the the encounter in which it belongs.  

## 2017-11-10 NOTE — Progress Notes (Addendum)
Family Medicine Teaching Service Daily Progress Note Intern Pager: [phone]  Patient name: Ian [name] Medical record number: [id number] Date of birth: 1926-04-29 Age: 84 y.o. Gender: male  Primary Care Provider: Bernerd Limbo, MD Consultants: Gastroenterology Code Status: Full  Pt Overview and Major Events to Date:  03/17: Admitted  03/17: EGD performed  Assessment and Plan: Ian [name] is a 84 yo male who presented with melena. Past medical history is significant for a recent CVA, HTN,dementia and cognitive impairment, anemia, PUD, alcohol abuse, hypothyroidism  Melena 2/2 duodenal ulcers: Patient with history of PUD. CBC on admission 8.4 from baseline of 11-12 and transfused 2 units.  Post-transfusion Hgb is stable at 10.7. - EGD saw 2 non-bleeding duodenal ulcers with no stigmata of bleeding were found in the duodenal bulb. Biopsies were taken with a cold forceps for histology. - Gastroenterology following, appreciate recommendations: restart ASA, plavix and no planned colonoscopy, patient ok for regular diet, indefinite PPI  Recent CVA: transferred from CIR. - Recent R embolic infarction  HTN: Home meds include norvasc. Currently 105/65 - will plan to introduce HTN medications as needed  Dementia: Chronic. Stable. - Monitor  Hypothyroidism: TSH 10.491 - On levothyroxine 25 mcg daily at home> increase to 34mcg  Hx of Alcohol abuse: Patient recently in CIR and to SNF  - Monitor mental status   Hypomagnesemia: 1.0 on 3/17 - Will give mag 2g   Leukocytosis: Appears to be chronic. 18.2 today. - monitor on daily CBC's  - monitor for signs of infection  FEN/GI: Regular diet PPx: SCD's  Disposition: continued inpatient care  Subjective:  Patient reports no dark stools or vomiting. He is upset because he wants some water this morning. He denies any pain or trouble swallowing.   Objective: Temp:  [96 F (35.6 C)-98.3 F (36.8 C)] 97.5 F (36.4 C) (03/18 0520) Pulse  Rate:  [48-106] 48 (03/18 0520) Resp:  [15-32] 15 (03/18 0520) BP: (85-133)/(48-94) 105/65 (03/18 0520) SpO2:  [93 %-100 %] 93 % (03/18 0520) Physical Exam: General: NAD ENTM: Moist mucous membranes Cardiovascular: RRR, no m/r/g, no LE edema Respiratory: CTA BL, normal work of breathing Gastrointestinal: soft, nontender, nondistended, normoactive BS MSK: moves 4 extremities equally Derm: no rashes appreciated Neuro: CN II-XII grossly intact  Laboratory: Recent Labs  Lab 11/08/17 2251 11/09/17 1043 11/09/17 1555  WBC 14.2* 13.8* 18.2*  HGB 8.4* 10.7* 10.4*  HCT 24.4* 29.9* 28.9*  PLT 336 250 259   Recent Labs  Lab 11/07/17 0643 11/08/17 2251 11/09/17 0159  NA 135 136 137  K 3.4* 3.8 4.0  CL 103 107 107  CO2 24 16* 18*  BUN 18 64* 70*  CREATININE 0.81 1.04 1.09  CALCIUM 10.2 9.8 9.9  PROT  --  5.5* 5.6*  BILITOT  --  1.4* 1.3*  ALKPHOS  --  53 52  ALT  --  18 17  AST  --  24 24  GLUCOSE 94 127* 118*  Troponins neg x3 Vit B12 186 TSH 10.491 Fecal Occult Blood positive  Imaging/Diagnostic Tests: EGD: Findings: The esophagus was normal. The entire examined stomach was normal. Biopsies were taken with a cold forceps for histology. Two non-bleeding superficial duodenal ulcers with no stigmata of bleeding were found in the duodenal bulb. The largest lesion was 15 mm in largest dimension. Biopsies were taken with a cold forceps for histology.  Ian [name], [place], DO 11/10/2017, 6:54 AM PGY-1, [place] Intern pager: [phone], text pages welcome

## 2017-11-10 NOTE — Progress Notes (Signed)
Pt refusing heart monitor, On  Call MD aware.

## 2017-11-10 NOTE — Discharge Summary (Signed)
[facility] Discharge Summary  Patient name: Ian [name] Medical record number: [id number] Date of birth: [birth year] Age: 84 y.o. Gender: male Date of Admission: 11/09/2017  Date of Discharge: 11/11/17 Admitting Physician: Toy Baker, MD  Primary Care Provider: Bernerd Limbo, MD Consultants: Gastroenterology  Indication for Hospitalization: melena  Discharge Diagnoses/Problem List:  Melena 2/2 duodenal ulcers Recent CVA HTN Dementia Hypothyroidism Hx of Alcohol abuse Hypomagnesemia Hypokalemia Leukocytosis  Disposition: SNF  Discharge Condition: stable  Discharge Exam: (From AM resident exam) General: NAD, pleasant Eyes: PERRL, EOMI, no conjunctival pallor or injection ENTM: Moist mucous membranes Cardiovascular: RRR, no m/r/g, no LE edema Respiratory: CTA BL, normal work of breathing Gastrointestinal: soft, nontender, nondistended, normoactive BS MSK: moves 4 extremities equally Derm: no rashes appreciated Neuro: CN II-XII grossly intact Psych: AOx3, appropriate affect  Brief Hospital Course:  This 84 yo male presented with coffee ground emesis today with melanotic stools, generalized fatigue. Patient was noted to have Hg dorp from 11.5 yesterday to 8.4 on admission. Have had at least 5 dark melanotic stools prior to admission and was noted to be hypotensive and tachycardic. Patient received 2 U of pRBC's and posttransfusion Hgb increased to 10.7. Of note, patient was recently started on aspirin and Plavix for recent CVA. Marland Kitchen  Patient was also found to have softer blood pressures in the setting of history of hypertension.  Patient's Norvasc was held during hospitalization due to this.  Gastroenterology was consulted and followed patient.  They performed an EGD which showed 2 nonbleeding duodenal ulcers with no stigmata of bleeding found in the duodenal bulb.  Prior to discharge patient was tolerating a regular diet and had no further  episodes of melena or coffee-ground emesis.  Prior to discharge patient's hemoglobin was stable at 9.2 and per GI recommendations patient was restarted on Plavix and aspirin along with Protonix 40 mg twice daily.  Issues for Follow Up:  1. Continue Protonix 40 mg twice a day, to be continued for the next 6 weeks, and after that, needs to be on Protonix 40 mg daily, which is to be continued indefinitely while patient is on aspirin and Plavix- will need to consider stop date for this given recent bleed. 2. Biopsies were taken with a cold forceps for histology during EGD. 3. Monitor patient potassium and magnesium, as these were low during admission requiring repletion. 4. Increased Levothyroxine to 3mcg daily from 25. 5. Patient needs thyroid US for complex thyroid nodule seen during previous hospital admission.  6. Patient will need CTA or MRA for ascending aortic aneurysm from previous admission.  7. Follow up with cardiology for loop recorder placement. 8. Follow up with CVTS for aortic aneurysm.  9. Patient's amlodipine 10 mg held during admission due to lower blood pressures in the setting of anemia.  Restart as needed.  Significant Procedures:  EGD: 2 non-bleeding duodenal ulcers with no stigmata of bleeding were found in the duodenal bulb.  Significant Labs and Imaging:  Recent Labs  Lab 11/09/17 1555 11/10/17 0815 11/11/17 0651  WBC 18.2* 15.8* 16.3*  HGB 10.4* 9.2* 9.2*  HCT 28.9* 26.3* 26.9*  PLT 259 220 233   Recent Labs  Lab 11/06/17 0521 11/07/17 0643 11/08/17 2251 11/09/17 0159 11/11/17 0651  NA 135 135 136 137 138  K 3.2* 3.4* 3.8 4.0 3.1*  CL 103 103 107 107 110  CO2 24 24 16* 18* 19*  GLUCOSE 94 94 127* 118* 113*  BUN 15 18 64* 70* 28*  CREATININE 0.83 0.81 1.04 1.09 0.82  CALCIUM 10.0 10.2 9.8 9.9 9.2  MG  --   --   --  1.0* 1.3*  PHOS  --   --   --  3.0  --   ALKPHOS  --   --  53 52  --   AST  --   --  24 24  --   ALT  --   --  18 17  --   ALBUMIN  --    --  2.6* 2.6*  --    Troponins neg x3 Vit B12 186 TSH 10.491 Fecal Occult Blood positive Mag 1.0>1.3   Results/Tests Pending at Time of Discharge:  Biopsies were taken with a cold forceps for histology during EGD.  Discharge Medications:  Allergies as of 11/11/2017   No Known Allergies     Medication List    STOP taking these medications   amLODipine 10 MG tablet Commonly known as:  NORVASC   hydrochlorothiazide 25 MG tablet Commonly known as:  HYDRODIURIL     TAKE these medications   aspirin 81 MG chewable tablet Chew 1 tablet (81 mg total) by mouth daily. Start on 10/30/17   atorvastatin 40 MG tablet Commonly known as:  LIPITOR Take 1 tablet (40 mg total) by mouth daily at 6 PM.   clopidogrel 75 MG tablet Commonly known as:  PLAVIX Take 1 tablet (75 mg total) by mouth daily. Start 10/30/17   levothyroxine 50 MCG tablet Commonly known as:  SYNTHROID, LEVOTHROID Take 1 tablet (50 mcg total) by mouth daily before breakfast. What changed:    medication strength  how much to take   multivitamin with minerals Tabs tablet Take 1 tablet by mouth daily. Start taking on:  11/12/2017   nitroGLYCERIN 0.4 MG SL tablet Commonly known as:  NITROSTAT Place 1 tablet (0.4 mg total) under the tongue every 5 (five) minutes x 3 doses as needed for chest pain.   pantoprazole 40 MG tablet Commonly known as:  PROTONIX Take 1 tablet (40 mg total) by mouth 2 (two) times daily before a meal. After 6 weeks, patient to take once daily.   thiamine 100 MG tablet Take 1 tablet (100 mg total) by mouth daily. Start taking on:  11/12/2017            Durable Medical Equipment  (From admission, onward)        Start     Ordered   11/10/17 1802  For home use only DME 3 n 1  Once     11/10/17 1801   11/10/17 1800  For home use only DME Walker rolling  Once    Question:  Patient needs a walker to treat with the following condition  Answer:  Balance problem   11/10/17 1801       Discharge Instructions: Please refer to Patient Instructions section of EMR for full details.  Patient was counseled important signs and symptoms that should prompt return to medical care, changes in medications, dietary instructions, activity restrictions, and follow up appointments.   Follow-Up Appointments: Contact information for after-discharge care    [facility] .   Service:  Skilled Nursing Contact information: [address] [place], [place], DO 11/11/2017, 12:48 PM PGY-1, [place]

## 2017-11-10 NOTE — Social Work (Addendum)
CSW aware that pt was recently discharged from [facility], unable to see offers from [facility] LCSW. Have sent pt clinicals to SNF's through hub.   4:23pm- CSW left packet with pt at bedside, pt very irritated by CSW visit. CSW also called wife and left message on cell phone answering machine requesting call back to discuss SNFs.   CSW continuing to follow.  [place], [place] [facility] [phone]

## 2017-11-10 NOTE — Discharge Summary (Signed)
NAMEMarland [name]  NASEER, HEARN                ACCOUNT NO.:  [id number]  MEDICAL RECORD NO.:  [id number]  LOCATION:                                 FACILITY:  PHYSICIAN:  Meredith Staggers, M.D.DATE OF BIRTH:  05/24/1926  DATE OF ADMISSION:  10/29/2017 DATE OF DISCHARGE:  11/09/2017                              DISCHARGE SUMMARY   DISCHARGE DIAGNOSES: 1. Right occipital embolic infarction as well as rhabdomyolysis. 2. SCDs for deep venous thrombosis prophylaxis. 3. Gastrointestinal bleed. 4. Dementia. 5. Hypertension. 6. Hypothyroidism. 7. Dysphagia. 8. Diastolic congestive heart failure.  HISTORY OF PRESENT ILLNESS:  This is an 84 year old right-handed male with history of hypertension, dementia, who lives with his wife, reported to be independent prior to admission with recent memory loss over the past months.  Presented on October 25, 2017, with altered mental status.  Reported fall, syncopal event.  He was down for approximately 16 hours before being found.  CT MRI showed right occipital infarction. 12 mm focus of reduced diffusion in the right occipital lobe.  Acute early subacute infarction.  No hemorrhage or mass effect.  X-rays of hip and shoulder negative.  Mildly elevated troponin at 0.19, felt to be related to demand ischemia.  CT angiogram of head and neck with no large vessel occlusion or stenosis.  Carotid Dopplers with no ICA stenosis. BUN 24, creatinine 1.32, CK elevated 5905, lactic acid 5.64. Echocardiogram with ejection fraction of 84%, grade 1 diastolic dysfunction.  EEG negative.  Neurology consulted.  Maintained on aspirin and Plavix for CVA prophylaxis.  Plan was for a 30-day event monitor. CK improved to 377.  The patient was admitted for comprehensive rehab program.  PAST MEDICAL HISTORY:  See discharge diagnoses.  SOCIAL HISTORY:  Lives with spouse.  Reported to be independent prior to admission with general decline over the past few months.  FUNCTIONAL STATUS  UPON ADMISSION TO REHAB SERVICES:  Minimal assist sit to stand, minimal guard lateral scoot transfers, min-to-mod assist activities of daily living.  PHYSICAL EXAMINATION:  VITAL SIGNS:  Blood pressure 152/93, pulse 86, temperature 98, respirations 18. GENERAL:  Alert male, somewhat agitated. HEENT:  EOMs intact. NECK:  Supple.  Nontender.  No JVD. CARDIAC:  Rate controlled. ABDOMEN:  Soft, nontender.  Good bowel sounds. LUNGS:  Clear to auscultation without wheeze. NEUROLOGIC:  He did follow simple commands.  Right upper extremity 3+/5 proximal to distal.  Right lower extremity 2/5 knee extension, 0/5 ankle dorsi and plantar flexion.  REHABILITATION HOSPITAL COURSE:  The patient was admitted to Inpatient Rehab Services.  Therapies initiated on a 3-hour daily basis, consisting of physical therapy, occupational therapy, speech therapy, and rehabilitation nursing.  The following issues were addressed during the patient's rehab stay.  Pertaining to Mr. Becvar' right occipital embolic infarct, remained stable, maintained on aspirin and Plavix therapy with plan for a 30-day event monitor.  Renal function continued to greatly improve.  SCDs for DVT prophylaxis.  Blood pressures monitored with Norvasc.  He exhibited no other signs of fluid overload.  In regard to the patient's dementia, wife felt the patient close to baseline.  He was needed some encouragement to participate with his therapies.  He was on a mechanical soft diet.  Renal function greatly improved with latest creatinine 1.04.  The patient was receiving weekly collaborative interdisciplinary team conferences.  Again, needing some encouragement at times to participate with his therapies.  Ambulating 50 feet, handheld assistance.  Monitoring for safety awareness.  He could put on his shoes with supervision complete bed to wheelchair.  His stand pivot transfers supervision.  Again, bouts of agitation, restlessness, yelling out at  times.  On the evening of November 08, 2017, noted coffee-ground emesis.  Specimen sent for occult stool that was positive.  Hemoglobin decreased from 11.5 to 8.4.  The patient generally less responsive. Ordered given to transfuse 2 units of packed red blood cells.  Placed on IV fluids.  Plavix and aspirin were held.  Contacts made to [facility] in regard to transfer the patient to more intensive monitoring as well as Gastroenterology consulted.  He was discharged in guarded condition.  All medication changes made as per [facility].     Lauraine Rinne, P.A.   ______________________________ Meredith Staggers, M.D.    DA/MEDQ  D:  11/10/2017  T:  11/10/2017  Job:  [id number]

## 2017-11-10 NOTE — Evaluation (Signed)
Occupational Therapy Evaluation Patient Details Name: Ian [name] MRN: [id number] DOB: 03-10-1926 Today's Date: 11/10/2017    History of Present Illness Pt admitted from CIR with melena secondary to duodenal ulcers. Underwent EGD 11/09/2017. Pt has been at CIR s/p R occipital embolic infarction. PMH significant for calculus of kidney, hypertension, leukocytosis, dementia, hypothyroidism, and diastolic congestive heart failure.   Clinical Impression   Pt has been participating in rehabilitation at [facility] level prior to current admission and per chart making good functional gains. He was independent prior to initial admission for CVA. Pt currently requires min assist for LB ADL, min assist for UB ADL, and mod assist for stand-pivot toilet transfer with RW this session. Pt presents with decreased attention and awareness but was able to follow multi-step commands inconsistently this session. Pt would benefit from continued OT services while admitted to improve independence and safety with ADL and functional mobility. Per chart, pt's family prefers D/C to SNF level rehabilitation rather than return to CIR. Recommend short-term SNF rehabilitation post-acute D/C to maximize return to independence. OT will continue to follow while admitted.     Follow Up Recommendations  SNF;Supervision/Assistance - 24 hour    Equipment Recommendations  Other (comment)(defer to next venue of care)    Recommendations for Other Services       Precautions / Restrictions Precautions Precautions: Fall Restrictions Weight Bearing Restrictions: No      Mobility Bed Mobility Overal bed mobility: Needs Assistance Bed Mobility: Supine to Sit     Supine to sit: Min guard     General bed mobility comments: Min guard assist for safety.   Transfers Overall transfer level: Needs assistance Equipment used: Rolling walker (2 wheeled) Transfers: Stand Pivot Transfers;Sit to/from Stand Sit to Stand: Min assist Stand  pivot transfers: Mod assist       General transfer comment: Min assist to power up to standing position. Mod assist for pivot to chair due to pt attempting to sit unsafely.     Balance Overall balance assessment: Needs assistance Sitting-balance support: No upper extremity supported;Feet supported Sitting balance-Leahy Scale: Good     Standing balance support: During functional activity;Bilateral upper extremity supported Standing balance-Leahy Scale: Poor Standing balance comment: Relies on external assistance                           ADL either performed or assessed with clinical judgement   ADL Overall ADL's : Needs assistance/impaired Eating/Feeding: Set up;Sitting   Grooming: Set up;Sitting   Upper Body Bathing: Minimal assistance;Sitting   Lower Body Bathing: Minimal assistance;Sit to/from stand   Upper Body Dressing : Minimal assistance;Sitting   Lower Body Dressing: Minimal assistance;Sit to/from stand   Toilet Transfer: Moderate assistance;Stand-pivot;RW Toilet Transfer Details (indicate cue type and reason): Stood with min assist but attempting to sit unsafely requiring mod assist to guide safely to chair. Toileting- Clothing Manipulation and Hygiene: Moderate assistance;Sit to/from stand       Functional mobility during ADLs: Minimal assistance;Rolling walker General ADL Comments: Pt with fluctuating affect throughout session.      Vision Baseline Vision/History: Wears glasses Wears Glasses: Reading only Patient Visual Report: No change from baseline Vision Assessment?: Vision impaired- to be further tested in functional context;Yes Eye Alignment: Within Functional Limits Ocular Range of Motion: Within Functional Limits Alignment/Gaze Preference: Within Defined Limits Tracking/Visual Pursuits: Decreased smoothness of horizontal tracking;Decreased smoothness of vertical tracking Depth Perception: Overshoots;Undershoots Additional Comments:  Poor ability to track in all  directions and decreased visual attention throughout testing. Unable to sustain gaze out of midline for extended period of time.      Perception     Praxis      Pertinent Vitals/Pain Pain Assessment: No/denies pain Pain Location: "I'm in the hospital, I'm in pain just not the physical kind."     Hand Dominance Right   Extremity/Trunk Assessment Upper Extremity Assessment Upper Extremity Assessment: Generalized weakness   Lower Extremity Assessment Lower Extremity Assessment: Defer to PT evaluation       Communication Communication Communication: HOH   Cognition Arousal/Alertness: Awake/alert Behavior During Therapy: Agitated Overall Cognitive Status: Impaired/Different from baseline Area of Impairment: Attention;Memory;Following commands;Safety/judgement;Awareness;Problem solving                   Current Attention Level: Selective Memory: Decreased short-term memory Following Commands: Follows multi-step commands inconsistently Safety/Judgement: Decreased awareness of safety;Decreased awareness of deficits Awareness: Emergent Problem Solving: Slow processing;Difficulty sequencing;Requires verbal cues;Requires tactile cues General Comments: Pt agitated with nursing staff on my arrival. Becoming agitated with OT at times as well but did follow commands well. Requires multimodal cues for following commands.    General Comments       Exercises     Shoulder Instructions      Home Living Family/patient expects to be discharged to:: Skilled nursing facility Living Arrangements: Spouse/significant other                                      Prior Functioning/Environment Level of Independence: Independent;Needs assistance  Gait / Transfers Assistance Needed: ambulating with min assist at CIR ADL's / Homemaking Assistance Needed: minimal assistance from OT at rehab   Comments: Information from notes at CIR         OT Problem List: Decreased strength;Decreased range of motion;Decreased activity tolerance;Impaired balance (sitting and/or standing);Decreased safety awareness;Decreased knowledge of use of DME or AE;Decreased knowledge of precautions;Pain;Impaired vision/perception      OT Treatment/Interventions: Self-care/ADL training;Therapeutic exercise;Energy conservation;DME and/or AE instruction;Therapeutic activities;Patient/family education;Balance training    OT Goals(Current goals can be found in the care plan section) Acute Rehab OT Goals Patient Stated Goal: to get out of here OT Goal Formulation: With patient Time For Goal Achievement: 11/24/17 Potential to Achieve Goals: Good ADL Goals Pt Will Perform Grooming: with min assist;standing Pt Will Perform Lower Body Dressing: with min guard assist;sit to/from stand Pt Will Transfer to Toilet: with min guard assist;bedside commode;stand pivot transfer Pt Will Perform Toileting - Clothing Manipulation and hygiene: with min guard assist;sit to/from stand Additional ADL Goal #1: Pt will demonstrate alternating attention during seated grooming tasks in a minimally distracting environment. Additional ADL Goal #2: Pt will demonstrate follow 3-step commands during morning ADL routine with no verbal cues.  OT Frequency: Min 2X/week   Barriers to D/C:            Co-evaluation              AM-PAC PT "6 Clicks" Daily Activity     Outcome Measure Help from another person eating meals?: A Little Help from another person taking care of personal grooming?: A Little Help from another person toileting, which includes using toliet, bedpan, or urinal?: A Lot Help from another person bathing (including washing, rinsing, drying)?: A Little Help from another person to put on and taking off regular upper body clothing?: A Little Help from another person to put  on and taking off regular lower body clothing?: A Little 6 Click Score: 17   End of Session  Equipment Utilized During Treatment: Gait belt;Rolling walker Nurse Communication: Mobility status  Activity Tolerance: Patient tolerated treatment well Patient left: in chair;with call bell/phone within reach;with chair alarm set  OT Visit Diagnosis: Unsteadiness on feet (R26.81);Other abnormalities of gait and mobility (R26.89);Muscle weakness (generalized) (M62.81);Other symptoms and signs involving cognitive function                Time: 1035-1053 OT Time Calculation (min): 18 min Charges:  OT General Charges $OT Visit: 1 Visit OT Evaluation $OT Eval Moderate Complexity: 1 Mod G-Codes:     Norman Herrlich, MS OTR/L  Pager: R[place] A Avie Checo 11/10/2017, 2:00 PM

## 2017-11-10 NOTE — Progress Notes (Signed)
Subjective: The patient was seen and examined at bedside. He denies nausea, vomiting or abdominal pain and once his diet to be advanced. He had an endoscopy yesterday which showed duodenal ulcers. No further hematemesis or melena since procedure.  Objective: Vital signs in last 24 hours: Temp:  [97.5 F (36.4 C)-98.3 F (36.8 C)] 97.5 F (36.4 C) (03/18 0520) Pulse Rate:  [48-106] 48 (03/18 0520) Resp:  [15-23] 15 (03/18 0520) BP: (105-129)/(63-88) 105/65 (03/18 0520) SpO2:  [93 %-100 %] 93 % (03/18 0520) Weight:  [71.2 kg (156 lb 15.5 oz)] 71.2 kg (156 lb 15.5 oz) (03/18 1038) Weight change:  Last BM Date: 11/08/17  PE:not in acute distress GENERAL:mild pallor ABDOMEN:soft, nondistended, nontender, normoactive bowel sounds EXTREMITIES:no edema  Lab Results: Results for orders placed or performed during the hospital encounter of 11/09/17 (from the past 48 hour(s))  MRSA PCR Screening     Status: None   Collection Time: 11/09/17  1:12 AM  Result Value Ref Range   MRSA by PCR NEGATIVE NEGATIVE    Comment:        The GeneXpert MRSA Assay (FDA approved for NASAL specimens only), is one component of a comprehensive MRSA colonization surveillance program. It is not intended to diagnose MRSA infection nor to guide or monitor treatment for MRSA infections. Performed at [facility], [place] [address], [place], [place] [address]   ABO/Rh     Status: None   Collection Time: 11/09/17  1:45 AM  Result Value Ref Range   ABO/RH(D)      O POS Performed at [facility] [address], [place], [place] [address]   Magnesium     Status: Abnormal   Collection Time: 11/09/17  1:59 AM  Result Value Ref Range   Magnesium 1.0 (L) 1.7 - 2.4 mg/dL    Comment: Performed at [facility] [address] [place], [place] [address]  Phosphorus     Status: None   Collection Time: 11/09/17  1:59 AM  Result Value Ref Range   Phosphorus 3.0 2.5 - 4.6 mg/dL    Comment:  Performed at [facility] [address] [place], [place] [address]  TSH     Status: Abnormal   Collection Time: 11/09/17  1:59 AM  Result Value Ref Range   TSH 10.491 (H) 0.350 - 4.500 uIU/mL    Comment: Performed by a 3rd Generation assay with a functional sensitivity of <=0.01 uIU/mL. Performed at [facility], [place] [address], [place],  [address]   Comprehensive metabolic panel     Status: Abnormal   Collection Time: 11/09/17  1:59 AM  Result Value Ref Range   Sodium 137 135 - 145 mmol/L   Potassium 4.0 3.5 - 5.1 mmol/L   Chloride 107 101 - 111 mmol/L   CO2 18 (L) 22 - 32 mmol/L   Glucose, Bld 118 (H) 65 - 99 mg/dL   BUN 70 (H) 6 - 20 mg/dL   Creatinine, Ser 1.09 0.61 - 1.24 mg/dL   Calcium 9.9 8.9 - 10.3 mg/dL   Total Protein 5.6 (L) 6.5 - 8.1 g/dL   Albumin 2.6 (L) 3.5 - 5.0 g/dL   AST 24 15 - 41 U/L   ALT 17 17 - 63 U/L   Alkaline Phosphatase 52 38 - 126 U/L   Total Bilirubin 1.3 (H) 0.3 - 1.2 mg/dL   GFR calc non Af Amer 58 (L) >60 mL/min   GFR calc  Af Amer >60 >60 mL/min    Comment: (NOTE) The eGFR has been calculated using the CKD EPI equation. This calculation has not been validated in all clinical situations. eGFR's persistently <60 mL/min signify possible Chronic Kidney Disease.    Anion gap 12 5 - 15    Comment: Performed at [facility] [address] [place], [place] [address]  Troponin I     Status: None   Collection Time: 11/09/17  1:59 AM  Result Value Ref Range   Troponin I <0.03 <0.03 ng/mL    Comment: Performed at [facility] [address] [place], [place] [address]  Protime-INR     Status: Abnormal   Collection Time: 11/09/17  1:59 AM  Result Value Ref Range   Prothrombin Time 15.6 (H) 11.4 - 15.2 seconds   INR 1.26     Comment: Performed at [facility] [address] [place], [place] [address]  Glucose, capillary     Status: None   Collection Time: 11/09/17  8:37 AM  Result Value Ref Range    Glucose-Capillary 97 65 - 99 mg/dL  CBC     Status: Abnormal   Collection Time: 11/09/17 10:43 AM  Result Value Ref Range   WBC 13.8 (H) 4.0 - 10.5 K/uL   RBC 3.30 (L) 4.22 - 5.81 MIL/uL   Hemoglobin 10.7 (L) 13.0 - 17.0 g/dL    Comment: REPEATED TO VERIFY POST TRANSFUSION SPECIMEN    HCT 29.9 (L) 39.0 - 52.0 %   MCV 90.6 78.0 - 100.0 fL    Comment: POST TRANSFUSION SPECIMEN   MCH 32.4 26.0 - 34.0 pg   MCHC 35.8 30.0 - 36.0 g/dL   RDW 15.1 11.5 - 15.5 %   Platelets 250 150 - 400 K/uL    Comment: Performed at [facility], [place] [address] [place], [place] [address]  Troponin I     Status: None   Collection Time: 11/09/17 10:43 AM  Result Value Ref Range   Troponin I <0.03 <0.03 ng/mL    Comment: Performed at [facility] [address], [place],  [address]  Urinalysis, Routine w reflex microscopic     Status: Abnormal   Collection Time: 11/09/17 10:51 AM  Result Value Ref Range   Color, Urine YELLOW YELLOW   APPearance CLEAR CLEAR   Specific Gravity, Urine 1.019 1.005 - 1.030   pH 5.0 5.0 - 8.0   Glucose, UA NEGATIVE NEGATIVE mg/dL   Hgb urine dipstick SMALL (A) NEGATIVE   Bilirubin Urine NEGATIVE NEGATIVE   Ketones, ur NEGATIVE NEGATIVE mg/dL   Protein, ur NEGATIVE NEGATIVE mg/dL   Nitrite NEGATIVE NEGATIVE   Leukocytes, UA NEGATIVE NEGATIVE   RBC / HPF NONE SEEN 0 - 5 RBC/hpf   WBC, UA NONE SEEN 0 - 5 WBC/hpf   Bacteria, UA NONE SEEN NONE SEEN   Squamous Epithelial / LPF 0-5 (A) NONE SEEN   Mucus PRESENT    Hyaline Casts, UA PRESENT     Comment: Performed at [facility], [place] [address], [place] [address]  CBC     Status: Abnormal   Collection Time: 11/09/17  3:55 PM  Result Value Ref Range   WBC 18.2 (H) 4.0 - 10.5 K/uL   RBC 3.19 (L) 4.22 - 5.81 MIL/uL   Hemoglobin 10.4 (L) 13.0 - 17.0 g/dL   HCT 28.9 (L) 39.0 - 52.0 %   MCV 90.6 78.0 - 100.0  fL   MCH 32.6 26.0 - 34.0 pg   MCHC 36.0 30.0 - 36.0 g/dL   RDW 15.6 (H) 11.5 - 15.5  %   Platelets 259 150 - 400 K/uL    Comment: Performed at [facility] [address] [place], [place] [address]  Troponin I     Status: None   Collection Time: 11/09/17  3:55 PM  Result Value Ref Range   Troponin I <0.03 <0.03 ng/mL    Comment: Performed at [facility] [address], [place], [place] [address]  CBC     Status: Abnormal   Collection Time: 11/10/17  8:15 AM  Result Value Ref Range   WBC 15.8 (H) 4.0 - 10.5 K/uL   RBC 2.85 (L) 4.22 - 5.81 MIL/uL   Hemoglobin 9.2 (L) 13.0 - 17.0 g/dL   HCT 26.3 (L) 39.0 - 52.0 %   MCV 92.3 78.0 - 100.0 fL   MCH 32.3 26.0 - 34.0 pg   MCHC 35.0 30.0 - 36.0 g/dL   RDW 16.1 (H) 11.5 - 15.5 %   Platelets 220 150 - 400 K/uL    Comment: Performed at [facility], [place] [address] [place], [place] [address]    Studies/Results: No results found.  Medications: I have reviewed the patient's current medications.  Assessment: 1.Melena and hematemesis with duodenal ulcers noted on endoscopy. Antral biopsies pending. Patient remains hemodynamically stable. Hemoglobin yesterday was 10.4 and is 9.2 today without further bleeding. No plans for colonoscopy.  2.Hypothyroidism, TSH 10.491 3.Recent CVA 4.Dementia  Plan: 1.Advance diet to regular. 2.Stop proton IV trip and start Protonix 40 mg twice a day, to be continued for the next 6 weeks,after that, needs to be on Protonix 40 mg daily- to be continued indefinitely while patient is on aspirin and Plavix. 3. Okay to resume aspirin and Plavix from GI standpoint.  We will sign off, please recall GI if needed,   Ronnette Juniper 11/10/2017, 10:57 AM   Pager [phone] If no answer or after 5 PM call [phone]

## 2017-11-10 NOTE — Progress Notes (Signed)
Pt transferred back to acute over the weekend.  At time of transfer from [facility], wife had made the decision to pursue SNF as she was unable to provide 24/7 assistance.  I had sent out FL2 to area facilities and several facilities have made offers.    Adaleen Hulgan, LCSW

## 2017-11-10 NOTE — Evaluation (Signed)
Physical Therapy Evaluation Patient Details Name: Ian [name] MRN: [id number] DOB: October 17, 1925 Today's Date: 11/10/2017   History of Present Illness  Pt admitted from CIR with melena secondary to duodenal ulcers. Underwent EGD 11/09/2017. Pt has been at CIR s/p R occipital embolic infarction. PMH significant for calculus of kidney, hypertension, leukocytosis, dementia, hypothyroidism, and diastolic congestive heart failure.  Clinical Impression   Pt admitted with above diagnosis. Pt currently with functional limitations due to the deficits listed below (see PT Problem List). Presents with functional dependencies, R sided LE weakness, significant fall risk; Agree with SNF for further rehab;  Pt will benefit from skilled PT to increase their independence and safety with mobility to allow discharge to the venue listed below.       Follow Up Recommendations SNF    Equipment Recommendations  Rolling walker with 5" wheels;3in1 (PT)    Recommendations for Other Services       Precautions / Restrictions Precautions Precautions: Fall      Mobility  Bed Mobility                  Transfers Overall transfer level: Needs assistance Equipment used: Rolling walker (2 wheeled) Transfers: Sit to/from Stand Sit to Stand: Mod assist         General transfer comment: Mod assist to power up; cues for hand placement  Ambulation/Gait Ambulation/Gait assistance: Mod assist;+2 physical assistance Ambulation Distance (Feet): 15 Feet Assistive device: Rolling walker (2 wheeled) Gait Pattern/deviations: Decreased stance time - right;Decreased step length - left;Step-to pattern;Drifts right/left;Trunk flexed Gait velocity: decreased   General Gait Details: Gait deviations as noted; generally unsteady with decr R stance stability; fatigues quickly  Stairs            Wheelchair Mobility    Modified Rankin (Stroke Patients Only) Modified Rankin (Stroke Patients Only) Pre-Morbid  Rankin Score: No significant disability Modified Rankin: Moderately severe disability     Balance     Sitting balance-Leahy Scale: Good       Standing balance-Leahy Scale: Poor Standing balance comment: Relies on external assistance                             Pertinent Vitals/Pain Pain Assessment: No/denies pain    Home Living Family/patient expects to be discharged to:: Skilled nursing facility Living Arrangements: Spouse/significant other               Additional Comments: Noted family is interested in dc to SNF    Prior Function Level of Independence: Independent;Needs assistance   Gait / Transfers Assistance Needed: ambulating with min assist at CIR  ADL's / Homemaking Assistance Needed: minimal assistance from OT at rehab  Comments: Information from notes at [facility]: Right    Extremity/Trunk Assessment   Upper Extremity Assessment Upper Extremity Assessment: Generalized weakness;Defer to OT evaluation    Lower Extremity Assessment Lower Extremity Assessment: Generalized weakness;RLE deficits/detail;Difficult to assess due to impaired cognition(decr command following) RLE Deficits / Details: Noting functional weakness in R single limb stance, as well as instability in R ankle and decr dorsiflexion       Communication   Communication: HOH  Cognition Arousal/Alertness: Awake/alert Behavior During Therapy: Agitated Overall Cognitive Status: Impaired/Different from baseline Area of Impairment: Attention;Memory;Following commands;Safety/judgement;Awareness;Problem solving                   Current Attention Level: Selective Memory:  Decreased short-term memory Following Commands: Follows multi-step commands inconsistently Safety/Judgement: Decreased awareness of safety;Decreased awareness of deficits Awareness: Emergent Problem Solving: Slow processing;Difficulty sequencing;Requires verbal  cues;Requires tactile cues        General Comments      Exercises     Assessment/Plan    PT Assessment Patient needs continued PT services  PT Problem List Decreased strength;Decreased mobility;Decreased balance;Decreased cognition;Decreased safety awareness;Decreased knowledge of precautions;Pain       PT Treatment Interventions Gait training;Functional mobility training;Therapeutic activities;Therapeutic exercise;Balance training;Neuromuscular re-education;Manual techniques;Patient/family education;Stair training;DME instruction    PT Goals (Current goals can be found in the Care Plan section)  Acute Rehab PT Goals Patient Stated Goal: to get out of here PT Goal Formulation: Patient unable to participate in goal setting Time For Goal Achievement: 11/24/17 Potential to Achieve Goals: Fair    Frequency Min 2X/week   Barriers to discharge        Co-evaluation               AM-PAC PT "6 Clicks" Daily Activity  Outcome Measure Difficulty turning over in bed (including adjusting bedclothes, sheets and blankets)?: A Lot Difficulty moving from lying on back to sitting on the side of the bed? : Unable Difficulty sitting down on and standing up from a chair with arms (e.g., wheelchair, bedside commode, etc,.)?: Unable Help needed moving to and from a bed to chair (including a wheelchair)?: A Lot Help needed walking in hospital room?: A Lot Help needed climbing 3-5 steps with a railing? : Total 6 Click Score: 9    End of Session Equipment Utilized During Treatment: Gait belt Activity Tolerance: Treatment limited secondary to agitation Patient left: in chair;with call bell/phone within reach;with chair alarm set Nurse Communication: Mobility status PT Visit Diagnosis: Unsteadiness on feet (R26.81);Other abnormalities of gait and mobility (R26.89);History of falling (Z91.81);Muscle weakness (generalized) (M62.81);Difficulty in walking, not elsewhere classified (R26.2)     Time: 2355-7322 PT Time Calculation (min) (ACUTE ONLY): 14 min   Charges:   PT Evaluation $PT Eval Moderate Complexity: 1 Mod     PT G Codes:        Roney Marion, PT  Acute Rehabilitation Services Pager [phone] Office [phone]   Colletta [place] 11/10/2017, 4:43 PM

## 2017-11-11 LAB — CBC
HCT: 26.9 % — ABNORMAL LOW (ref 39.0–52.0)
Hemoglobin: 9.2 g/dL — ABNORMAL LOW (ref 13.0–17.0)
MCH: 31.9 pg (ref 26.0–34.0)
MCHC: 34.2 g/dL (ref 30.0–36.0)
MCV: 93.4 fL (ref 78.0–100.0)
Platelets: 233 10*3/uL (ref 150–400)
RBC: 2.88 MIL/uL — ABNORMAL LOW (ref 4.22–5.81)
RDW: 15.6 % — ABNORMAL HIGH (ref 11.5–15.5)
WBC: 16.3 10*3/uL — ABNORMAL HIGH (ref 4.0–10.5)

## 2017-11-11 LAB — BASIC METABOLIC PANEL
Anion gap: 9 (ref 5–15)
BUN: 28 mg/dL — ABNORMAL HIGH (ref 6–20)
CO2: 19 mmol/L — ABNORMAL LOW (ref 22–32)
Calcium: 9.2 mg/dL (ref 8.9–10.3)
Chloride: 110 mmol/L (ref 101–111)
Creatinine, Ser: 0.82 mg/dL (ref 0.61–1.24)
GFR calc Af Amer: >60 mL/min (ref >60)
GFR calc non Af Amer: >60 mL/min (ref >60)
Glucose, Bld: 113 mg/dL — ABNORMAL HIGH (ref 65–99)
Potassium: 3.1 mmol/L — ABNORMAL LOW (ref 3.5–5.1)
Sodium: 138 mmol/L (ref 135–145)

## 2017-11-11 LAB — MAGNESIUM: Magnesium: 1.3 mg/dL — ABNORMAL LOW (ref 1.7–2.4)

## 2017-11-11 LAB — PATHOLOGIST SMEAR REVIEW

## 2017-11-11 MED ORDER — THIAMINE HCL 100 MG PO TABS: 100.0000 mg | ORAL_TABLET | Freq: Every day | ORAL | 0 refills | Status: DC

## 2017-11-11 MED ORDER — ADULT MULTIVITAMIN W/MINERALS CH: 1.0000 | ORAL_TABLET | Freq: Every day | ORAL | 0 refills | Status: DC

## 2017-11-11 MED ORDER — VITAMIN B-1 100 MG PO TABS
100.0000 mg | ORAL_TABLET | Freq: Every day | ORAL | Status: DC
  Administered 2017-11-11: 100 mg via ORAL
  Filled 2017-11-11: qty 1

## 2017-11-11 MED ORDER — LEVOTHYROXINE SODIUM 50 MCG PO TABS: 50.0000 ug | ORAL_TABLET | Freq: Every day | ORAL | Status: AC

## 2017-11-11 MED ORDER — MAGNESIUM SULFATE 2 GM/50ML IV SOLN
2.0000 g | Freq: Once | INTRAVENOUS | Status: AC
  Administered 2017-11-11: 2 g via INTRAVENOUS
  Filled 2017-11-11: qty 50

## 2017-11-11 MED ORDER — PANTOPRAZOLE SODIUM 40 MG PO TBEC: 40.0000 mg | DELAYED_RELEASE_TABLET | Freq: Two times a day (BID) | ORAL | 0 refills | Status: DC

## 2017-11-11 MED ORDER — WHITE PETROLATUM EX OINT
TOPICAL_OINTMENT | CUTANEOUS | Status: AC
  Administered 2017-11-11: 06:00:00
  Filled 2017-11-11: qty 28.35

## 2017-11-11 MED ORDER — ADULT MULTIVITAMIN W/MINERALS CH
1.0000 | ORAL_TABLET | Freq: Every day | ORAL | Status: DC
  Administered 2017-11-11: 1 via ORAL
  Filled 2017-11-11: qty 1

## 2017-11-11 MED ORDER — POTASSIUM CHLORIDE CRYS ER 20 MEQ PO TBCR
40.0000 meq | EXTENDED_RELEASE_TABLET | Freq: Once | ORAL | Status: AC
  Administered 2017-11-11: 40 meq via ORAL
  Filled 2017-11-11: qty 2

## 2017-11-11 NOTE — Clinical Social Work Placement (Signed)
   CLINICAL SOCIAL WORK PLACEMENT  NOTE [facility] Room 102a RN to call report to [phone]  Date:  11/11/2017  Patient Details  Name: Ian [name] MRN: [id number] Date of Birth: 02-05-1926  Clinical Social Work is seeking post-discharge placement for this patient at the [place] level of care (*CSW will initial, date and re-position this form in  chart as items are completed):  Yes   Patient/family provided with [facility] Work Department's list of facilities offering this level of care within the geographic area requested by the patient (or if unable, by the patient's family).  Yes   Patient/family informed of their freedom to choose among providers that offer the needed level of care, that participate in Medicare, Medicaid or managed care program needed by the patient, have an available bed and are willing to accept the patient.  Yes   Patient/family informed of [facility]'s ownership interest in [facility] and [facility], as well as of the fact that they are under no obligation to receive care at these facilities.  PASRR submitted to EDS on       PASRR number received on       Existing PASRR number confirmed on 11/05/17     FL2 transmitted to all facilities in geographic area requested by pt/family on 11/05/17     FL2 transmitted to all facilities within larger geographic area on 11/10/17     Patient informed that his/her managed care company has contracts with or will negotiate with certain facilities, including the following:        Yes   Patient/family informed of bed offers received.  Patient chooses bed at Other - please specify in the comment section below:([facility]     Physician recommends and patient chooses bed at      Patient to be transferred to Other - please specify in the comment section below:([facility] on 11/11/17.  Patient to be  transferred to facility by [facility]     Patient family notified on 11/11/17 of transfer.  Name of family member notified:  [name], wife     PHYSICIAN       Additional Comment:    _______________________________________________ Alexander Mt, LCSWA 11/11/2017, 2:17 PM

## 2017-11-11 NOTE — Social Work (Addendum)
CSW spoke with pt at bedside, pt states he doesn't know where the SNF packet was and doesn't have any answers for me.   CSW called and spoke with pt wife, she was out walking family dog and will call CSW back with SNF choices when back from walk. Pt wife aware that pt is ready for discharge and tells CSW that she has selected 3 potential facilities. We will need to get insurance authorization from facility prior to discharge.   11:00am- CSW received return call from pt wife: choices are as follows-[facility], [facility], [facility]. CSW f/u with liaison from [facility] They have begun insurance authorization. Pt wife will complete paperwork for pt with admissions liaison.   12:20pm- Pt has received insurance authorization, pt PASSR sent to facility, paperwork will be completed with wife. CSW will page MD for discharge summary.   CSW continuing to follow.  Alexander Mt, [place] Work [phone]

## 2017-11-11 NOTE — Social Work (Signed)
Clinical Social Worker facilitated patient discharge including contacting patient family and facility to confirm patient discharge plans.  Clinical information faxed to facility and family agreeable with plan.  CSW arranged ambulance transport via [facility] to [facility] Room 120a.  RN to call [phone] with report prior to discharge.  Clinical Social Worker will sign off for now as social work intervention is no longer needed. Please consult [place] again if new need arises.  Alexander Mt, [place] Social Worker

## 2017-11-11 NOTE — Progress Notes (Signed)
Family Medicine Teaching Service Daily Progress Note Intern Pager: [phone]  Patient name: Ian [name] Medical record number: [id number] Date of birth: Sep 10, 1925 Age: 84 y.o. Gender: male  Primary Care Provider: Bernerd Limbo, MD Consultants: Gastroenterology Code Status: Full  Pt Overview and Major Events to Date:  03/17: Admitted  03/17: EGD performed  Assessment and Plan: Alvey Brockel is a 84 yo male who presented with melena. Past medical history is significant for a recent CVA, HTN,dementia and cognitive impairment, anemia, PUD, alcohol abuse, hypothyroidism  Melena 2/2 duodenal ulcers: Patient with history of PUD. CBC baseline of 11-12. Hgb stable at 9.2.  - EGD Biopsies were taken with a cold forceps for histology. - Gastroenterology has signed off  Recent CVA: transferred from [facility]. - Recent R embolic infarction  HTN: Home meds include norvasc. Currently 112/67 - will plan to introduce HTN medications as needed  Dementia: Chronic. Stable. - Monitor  Hypothyroidism: TSH 10.491 - Continue levothyroxine 27mcg  Hx of Alcohol abuse: Patient recently in CIR and to SNF  - Monitor mental status  - Thiamine and multivitamin  Hypomagnesemia: 1.3 on 3/19 - Give mag 2g  Hypokalemia: K 3.1 - Will give K-dur 40 meq  Leukocytosis: Appears to be chronic. 16.3 today. - monitor on daily CBC's  - monitor for signs of infection  FEN/GI: Regular diet PPx: SCD's  Disposition: Medically stable for discharge  Subjective:  Patient ready to get out of hospital. Wife picking out SNF's this am.   Objective: Temp:  [97.5 F (36.4 C)-97.7 F (36.5 C)] 97.7 F (36.5 C) (03/19 0529) Pulse Rate:  [90-95] 95 (03/19 0529) Resp:  [18-20] 18 (03/19 0529) BP: (99-112)/(67-80) 112/67 (03/19 0529) SpO2:  [99 %-100 %] 100 % (03/19 0529) Weight:  [156 lb 15.5 oz (71.2 kg)] 156 lb 15.5 oz (71.2 kg) (03/18 1038) Physical Exam: General: NAD, pleasant Eyes: PERRL, EOMI, no conjunctival  pallor or injection ENTM: Moist mucous membranes Cardiovascular: RRR, no m/r/g, no LE edema Respiratory: CTA BL, normal work of breathing Gastrointestinal: soft, nontender, nondistended, normoactive BS MSK: moves 4 extremities equally Derm: no rashes appreciated Neuro: CN II-XII grossly intact Psych: AOx3, appropriate affect  Laboratory: Recent Labs  Lab 11/09/17 1555 11/10/17 0815 11/11/17 0651  WBC 18.2* 15.8* 16.3*  HGB 10.4* 9.2* 9.2*  HCT 28.9* 26.3* 26.9*  PLT 259 220 233   Recent Labs  Lab 11/08/17 2251 11/09/17 0159 11/11/17 0651  NA 136 137 138  K 3.8 4.0 3.1*  CL 107 107 110  CO2 16* 18* 19*  BUN 64* 70* 28*  CREATININE 1.04 1.09 0.82  CALCIUM 9.8 9.9 9.2  PROT 5.5* 5.6*  --   BILITOT 1.4* 1.3*  --   ALKPHOS 53 52  --   ALT 18 17  --   AST 24 24  --   GLUCOSE 127* 118* 113*  Troponins neg x3 Vit B12 186 TSH 10.491 Fecal Occult Blood positive Mag 1.0>1.3  Imaging/Diagnostic Tests:  EGD: Findings: The esophagus was normal. The entire examined stomach was normal. Biopsies were taken with a cold forceps for histology. Two non-bleeding superficial duodenal ulcers with no stigmata of bleeding were found in the duodenal bulb. The largest lesion was 15 mm in largest dimension. Biopsies were taken with a cold forceps for histology.  Jamere Stidham, [place], DO 11/11/2017, 9:38 AM PGY-1, [facility] Intern pager: [phone], text pages welcome

## 2017-11-11 NOTE — Progress Notes (Signed)
Pt sat in the chair today. Refused lunch. Pt is aware of discharge to SNF. Wife will see him in AM at the facility. A copy of discharge instructions was given to the pt.  1750 Discharged pt to [facility] via [facility]. Report was given to Advice worker at [facility].

## 2017-11-12 ENCOUNTER — Encounter: Payer: Self-pay | Admitting: Adult Health

## 2017-11-12 ENCOUNTER — Non-Acute Institutional Stay (SKILLED_NURSING_FACILITY): Payer: Medicare Other | Admitting: Adult Health

## 2017-11-12 DIAGNOSIS — E785 Hyperlipidemia, unspecified: Secondary | ICD-10-CM | POA: Diagnosis not present

## 2017-11-12 DIAGNOSIS — K922 Gastrointestinal hemorrhage, unspecified: Secondary | ICD-10-CM | POA: Diagnosis not present

## 2017-11-12 DIAGNOSIS — T39395A Adverse effect of other nonsteroidal anti-inflammatory drugs [NSAID], initial encounter: Secondary | ICD-10-CM | POA: Diagnosis not present

## 2017-11-12 DIAGNOSIS — F0390 Unspecified dementia without behavioral disturbance: Secondary | ICD-10-CM

## 2017-11-12 DIAGNOSIS — I63431 Cerebral infarction due to embolism of right posterior cerebral artery: Secondary | ICD-10-CM | POA: Diagnosis not present

## 2017-11-12 DIAGNOSIS — I69391 Dysphagia following cerebral infarction: Secondary | ICD-10-CM

## 2017-11-12 DIAGNOSIS — E890 Postprocedural hypothyroidism: Secondary | ICD-10-CM

## 2017-11-12 DIAGNOSIS — D62 Acute posthemorrhagic anemia: Secondary | ICD-10-CM

## 2017-11-12 NOTE — Progress Notes (Signed)
Location:   [facility] Room Number: 120 A Place of Service:  SNF (31)   CODE STATUS: Full Code  No Known Allergies  Chief Complaint  Patient presents with  . Hospitalization Follow-up    Hospital follow up    HPI:  He is an 84 year old man who has been hospitalized for GI bleed with coffee ground emesis and melanotic stools. He had a drop of hgb from 11.5 to 8.4. He did require 2 units prbcs. He had an EGD which did demonstrate 2 nonbleeding duodenal ulcerations. He was restarted on his plavix and asa per GI recommendations. He has been started on protonix as well. He will need to be seen by endocrinology and CVTS for thyroid nodule and ascending aortic aneurysm. He will also need to follow up with cardiology for loop recorder placement. He is here for short term rehab with his goal to return back home. There are no reports of chest pain; no reports of vomiting; no reports of insomnia present. He is unable to fully participate in the hpi or ros. There are no nursing concerns at this time.    Past Medical History:  Diagnosis Date  . 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 Calculus of kidney 03/31/2013   Overview:  IMPRESSION: see ER note   . Hypertension   . Leukocytosis 10/26/2017  . Loss of weight 10/26/2017  . Multiple leg contusions, unspecified laterality, sequela 10/26/2017  . Scalp hematoma, subsequent encounter 10/26/2017  . Stroke [facility])     Past Surgical History:  Procedure Laterality Date  . ESOPHAGOGASTRODUODENOSCOPY (EGD) WITH PROPOFOL N/A 11/09/2017   Procedure: ESOPHAGOGASTRODUODENOSCOPY (EGD) WITH PROPOFOL;  Surgeon: Laurence Spates, MD;  Location: [facility];  Service: Endoscopy;  Laterality: N/A;    Social History   Socioeconomic History  . Marital status: Married    Spouse name: Not on file  . Number of children: Not on file  . Years of education: Not on file  . Highest education level: Not on file  Social Needs  . Financial resource  strain: Not on file  . Food insecurity - worry: Not on file  . Food insecurity - inability: Not on file  . Transportation needs - medical: Not on file  . Transportation needs - non-medical: Not on file  Occupational History  . Not on file  Tobacco Use  . Smoking status: Never Smoker  . Smokeless tobacco: Never Used  Substance and Sexual Activity  . Alcohol use: Yes    Alcohol/week: 3.6 - 7.2 oz    Types: 6 - 12 Cans of beer per week    Comment: 3-6 alcoholic drinks per day  . Drug use: No  . Sexual activity: Not on file  Other Topics Concern  . Not on file  Social History Narrative  . Not on file   Family History  Problem Relation Age of Onset  . Brain cancer Mother   . Cancer Sister       VITAL SIGNS BP 127/72   Pulse 94   Temp 98.1 F (36.7 C)   Resp 18   Ht 5\' 11"  (1.803 m)   Wt 154 lb 9 oz (70.1 kg)   SpO2 96%   BMI 21.56 kg/m   Outpatient Encounter Medications as of 11/12/2017  Medication Sig  . aspirin 81 MG chewable tablet Chew 1 tablet (81 mg total) by mouth daily. Start on 10/30/17  . atorvastatin (LIPITOR) 40 MG tablet Take 1 tablet (40 mg  total) by mouth daily at 6 PM.  . clopidogrel (PLAVIX) 75 MG tablet Take 1 tablet (75 mg total) by mouth daily. Start 10/30/17  . levothyroxine (SYNTHROID, LEVOTHROID) 50 MCG tablet Take 1 tablet (50 mcg total) by mouth daily before breakfast.  . Multiple Vitamin (MULTIVITAMIN WITH MINERALS) TABS tablet Take 1 tablet by mouth daily.  . nitroGLYCERIN (NITROSTAT) 0.4 MG SL tablet Place 1 tablet (0.4 mg total) under the tongue every 5 (five) minutes x 3 doses as needed for chest pain.  . pantoprazole (PROTONIX) 40 MG tablet Take 1 tablet (40 mg total) by mouth 2 (two) times daily before a meal. After 6 weeks, patient to take once daily.  Marland Kitchen thiamine 100 MG tablet Take 1 tablet (100 mg total) by mouth daily.   No facility-administered encounter medications on file as of 11/12/2017.      SIGNIFICANT DIAGNOSTIC EXAMS  TODAY:     10-27-17: ct angio of head and neck: CT HEAD:   1. Minimal bifrontal subarachnoid hemorrhage, possible tiny subdural hematomas. 2. Moderate to severe chronic small vessel ischemic disease. Old small LEFT cerebellar infarct.   CTA NECK:  1. No hemodynamically significant stenosis or acute vascular process in the neck. 2. Status post probable RIGHT thyroidectomy with complex 19 mm RIGHT thyroid nodule. Recommend thyroid sonogram on nonemergent basis. 3. **An incidental finding of potential clinical significance has been found. 4.3 cm aneurysmal ascending aorta. Recommend annual imaging followup by CTA or MRA.  4. Multilevel severe neural foraminal narrowing and moderate craniocervical canal stenosis. CTA HEAD: 1. No emergent large vessel occlusion. Severe stenosis RIGHT P3 segment. 2. Mild stenosis bilateral supraclinoid internal carotid arteries.  10-01-17: ct of head: 1. Interval resolution of previously seen minimal bifrontal subarachnoid and subdural hemorrhage. No intracranial hemorrhage now identified. 2. No other new acute intracranial process. 3. Stable atrophy with advanced chronic microvascular ischemic Disease.  11-09-17: EGD: normal esophagus. Normal stomach; biopsied, multiple non-bleeding duodenal ulcer no stigmata of bleeding. Biopsied   TODAY:    10-25-17: chol 162; ldl 63; trig 66; hdl 86; hgb a1c 4.8 10-28-17: vit B 12: 210 11-08-17: wbc 14.2; hgb 8.4; hct 24.4; mcv 97.2; plt 336; glucose 127; bun 64; creat 1.04; k+ 3.8; na++ 136; ca 9.8; total bili 1.4; albumin 2.6; vit B 12: 186; iron 147; tibc 239; folate 7.7; ferritin 617 11-09-17: wbc 13.8; hgb 10.7; hct 29.9; mcv 90.6; plt 250; glucose 118; bun 70; creat 1.09; k+ 4.0; na++ 137; ca 9.9; total bili 1.3; albumin 2.6; PT 15.6; INR 1.26;  11-10-17: wbc 15.8; hgb 9.2; hct 26.3; mcv 92.3; plt 220;  11-11-17: wbc 16.7; hgb 9.7; hct 26.9; mcv 93.4 plt 233; glucose 113; bun 28; creat 0.82; k+ 3.1; na++ 138; ca 9.2; mag 1.3     Review of Systems  Unable to perform ROS: Dementia (confused )    Physical Exam  Constitutional: He appears well-developed and well-nourished. No distress.  Thin   Neck: No thyromegaly present.  Cardiovascular: Normal rate, regular rhythm, normal heart sounds and intact distal pulses.  Pedal pulses faint   Pulmonary/Chest: Effort normal and breath sounds normal. No respiratory distress.  Abdominal: Soft. Bowel sounds are normal. He exhibits no distension. There is no tenderness.  Musculoskeletal: He exhibits no edema.  Is able to move all extremities Has right foot drop Has right side weakness present   Lymphadenopathy:    He has no cervical adenopathy.  Neurological: He is alert.  Skin: Skin is warm and dry. He  is not diaphoretic.  Lower legs discolored Has scratches on bilateral lower legs  Psychiatric: He has a normal mood and affect.    ASSESSMENT/ PLAN:  TODAY:   1. Benign essential hypertension: stable b/p 127/72: is currently not on medications; will monitor his status.   2. Embolic cerebral infarction: is neurologically stable: will continue asa 81 mg daily and plavix 75 mg daily   3. Dyslipidemia: stable ldl 63 will continue lipitor 40 mg daily   4. Acute blood loss anemia: stable  hgb 9.7 will monitor   5. Post operative hypothyroidism:status post right thyroidectomy  stable will continue synthroid 50 mcg daily will need endocrinology consult for his thyroid nodule a complex 19 mm RIGHT thyroid nodule.  6. Dysphagia post stroke: is stable no signs of aspiration present will monitor  7.  GI bleed due to NSAIDS: stable no signs of further bleeding: will continue protonix 40 mg twice daily for 6 weeks then 40 mg daily   8.  Dementia without behavioral disturbance: is stable weight is 154 pounds; will not make changes will monitor   9.  Alcoholism: stable was drinking 2-3 drinks per day; will continue thiamine 100 mg daily   10. Aortic aneurysm: stable is  4.3 cm will follow up with CVTS   Will check cbc; cmp and mag level  Will setup for CVTS and endocrinology consults and cardiology for loop recorder.     MD is aware of resident's narcotic use and is in agreement with current plan of care. We will attempt to wean resident as apropriate   Ok Edwards NP [facility]  Contact [phone] Monday through Friday 8am- 5pm  After hours call [phone]

## 2017-11-13 ENCOUNTER — Non-Acute Institutional Stay (SKILLED_NURSING_FACILITY): Payer: Medicare Other | Admitting: Internal Medicine

## 2017-11-13 ENCOUNTER — Encounter: Payer: Self-pay | Admitting: Internal Medicine

## 2017-11-13 DIAGNOSIS — R197 Diarrhea, unspecified: Secondary | ICD-10-CM

## 2017-11-13 DIAGNOSIS — K269 Duodenal ulcer, unspecified as acute or chronic, without hemorrhage or perforation: Secondary | ICD-10-CM | POA: Diagnosis not present

## 2017-11-13 DIAGNOSIS — D649 Anemia, unspecified: Secondary | ICD-10-CM | POA: Diagnosis not present

## 2017-11-13 DIAGNOSIS — T796XXS Traumatic ischemia of muscle, sequela: Secondary | ICD-10-CM | POA: Diagnosis not present

## 2017-11-13 DIAGNOSIS — F0390 Unspecified dementia without behavioral disturbance: Secondary | ICD-10-CM | POA: Diagnosis not present

## 2017-11-13 DIAGNOSIS — E890 Postprocedural hypothyroidism: Secondary | ICD-10-CM | POA: Diagnosis not present

## 2017-11-13 DIAGNOSIS — I10 Essential (primary) hypertension: Secondary | ICD-10-CM | POA: Diagnosis not present

## 2017-11-13 DIAGNOSIS — Z8673 Personal history of transient ischemic attack (TIA), and cerebral infarction without residual deficits: Secondary | ICD-10-CM | POA: Diagnosis not present

## 2017-11-13 DIAGNOSIS — F1010 Alcohol abuse, uncomplicated: Secondary | ICD-10-CM

## 2017-11-13 DIAGNOSIS — M21371 Foot drop, right foot: Secondary | ICD-10-CM | POA: Diagnosis not present

## 2017-11-13 NOTE — Progress Notes (Signed)
Patient ID: Ian [name], male   DOB: 1926-06-21, 84 y.o.   MRN: [id number]  Provider:  DR Arletha Grippe Location:  [place] Room Number: 120 A Place of Service:  SNF (31)  PCP: Bernerd Limbo, MD Patient Care Team: Bernerd Limbo, MD as PCP - General (Family Medicine) Andria Frames Jamal Collin, MD as Attending Physician (Family Medicine)  Extended Emergency Contact Information Primary Emergency Contact: Renella Cunas Address: [address]          [place], [place] [address] Johnnette Litter of [place] Phone: [phone] Mobile Phone: [phone] Relation: Spouse  Code Status: Full Code Goals of Care: Advanced Directive information Advanced Directives 11/13/2017  Does Patient Have a Medical Advance Directive? No  Would patient like information on creating a medical advance directive? No - Patient declined      Chief Complaint  Patient presents with  . New Admit To SNF    Admission    HPI: Patient is a 84 y.o. male seen today for admission to SNF following hospital stay for right occipital lobe CVA, bifrontal SAH/tiny subdural, rhabdomyolysis, AKI, change in MS, elevated troponin, transaminitis, syncope, HTN, hypothyroidism with thyroid nodule. CTA neck showed incidental ascending aortic aneurysm 4.3 cm. 2D echo revealed EF 65-70% with grade 1 DD. EEG neg for sz activity. EGD at rehab showed 2 nonbleeding duodenal ulcers with no stigmata of bleeding in the duodenal bulb. CK 5905-->377. He presents to SNF for short term rehab.  Today he reports decreased appetite due to food not appealing. He is on mechanical soft diet. He has gait issues due to weakness in RLE - especially foot. No syncopal episodes. No falls. He is a poor historian due to dementia. Hx obtained from chart and therapy staff.  HTN - diet controlled.  Dyslipidemia - stable on lipitor 40 mg daily . LDL 63  Post operative hypothyroidism - s/p right thyroidectomy; stable on synthroid 50 mcg  daily; endocrinology eval pending for complex 19 mm RIGHT thyroid nodule.  Dementia - unchanged. He does not take any medications  Etoh abuse - he was drinking 2-3 drinks per day. he was started on thiamine 100 mg daily in the hospital  Past Medical History:  Diagnosis Date  . 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 Calculus of kidney 03/31/2013   Overview:  IMPRESSION: see ER note   . Hypertension   . Leukocytosis 10/26/2017  . Loss of weight 10/26/2017  . Multiple leg contusions, unspecified laterality, sequela 10/26/2017  . Scalp hematoma, subsequent encounter 10/26/2017  . Stroke [facility])    Past Surgical History:  Procedure Laterality Date  . ESOPHAGOGASTRODUODENOSCOPY (EGD) WITH PROPOFOL N/A 11/09/2017   Procedure: ESOPHAGOGASTRODUODENOSCOPY (EGD) WITH PROPOFOL;  Surgeon: Laurence Spates, MD;  Location: [place];  Service: Endoscopy;  Laterality: N/A;    reports that he has never smoked. He has never used smokeless tobacco. He reports that he drinks about 3.6 - 7.2 oz of alcohol per week. He reports that he does not use drugs. Social History   Socioeconomic History  . Marital status: Married    Spouse name: Not on file  . Number of children: Not on file  . Years of education: Not on file  . Highest education level: Not on file  Occupational History  . Not on file  Social Needs  . Financial resource strain: Not on file  . Food insecurity:    Worry: Not on file    Inability: Not on file  . Transportation  needs:    Medical: Not on file    Non-medical: Not on file  Tobacco Use  . Smoking status: Never Smoker  . Smokeless tobacco: Never Used  Substance and Sexual Activity  . Alcohol use: Yes    Alcohol/week: 3.6 - 7.2 oz    Types: 6 - 12 Cans of beer per week    Comment: 3-6 alcoholic drinks per day  . Drug use: No  . Sexual activity: Not on file  Lifestyle  . Physical activity:    Days per week: Not on file    Minutes per session: Not on file  . Stress: Not on  file  Relationships  . Social connections:    Talks on phone: Not on file    Gets together: Not on file    Attends religious service: Not on file    Active member of club or organization: Not on file    Attends meetings of clubs or organizations: Not on file    Relationship status: Not on file  . Intimate partner violence:    Fear of current or ex partner: Not on file    Emotionally abused: Not on file    Physically abused: Not on file    Forced sexual activity: Not on file  Other Topics Concern  . Not on file  Social History Narrative  . Not on file    Functional Status Survey:    Family History  Problem Relation Age of Onset  . Brain cancer Mother   . Cancer Sister     Health Maintenance  Topic Date Due  . INFLUENZA VACCINE  11/13/2018 (Originally 03/26/2017)  . TETANUS/TDAP  11/13/2018 (Originally 03/04/1945)  . PNA vac Low Risk Adult (1 of 2 - PCV13) 11/13/2018 (Originally 03/03/1993)    No Known Allergies  Outpatient Encounter Medications as of 11/13/2017  Medication Sig  . aspirin 81 MG chewable tablet Chew 1 tablet (81 mg total) by mouth daily. Start on 10/30/17  . atorvastatin (LIPITOR) 40 MG tablet Take 1 tablet (40 mg total) by mouth daily at 6 PM.  . clopidogrel (PLAVIX) 75 MG tablet Take 1 tablet (75 mg total) by mouth daily. Start 10/30/17  . levothyroxine (SYNTHROID, LEVOTHROID) 50 MCG tablet Take 1 tablet (50 mcg total) by mouth daily before breakfast.  . Multiple Vitamin (MULTIVITAMIN WITH MINERALS) TABS tablet Take 1 tablet by mouth daily.  . nitroGLYCERIN (NITROSTAT) 0.4 MG SL tablet Place 1 tablet (0.4 mg total) under the tongue every 5 (five) minutes x 3 doses as needed for chest pain.  . Nutritional Supplements (NUTRITIONAL SUPPLEMENT PO) NAS (no salt added) diet -  Mechanical soft texture, thin liquids consistency  . pantoprazole (PROTONIX) 40 MG tablet Take 1 tablet (40 mg total) by mouth 2 (two) times daily before a meal. After 6 weeks, patient to take once  daily.  Marland Kitchen thiamine 100 MG tablet Take 1 tablet (100 mg total) by mouth daily.   No facility-administered encounter medications on file as of 11/13/2017.     Review of Systems  Unable to perform ROS: Dementia    Vitals:   11/13/17 0922  BP: 127/72  Pulse: 94  Resp: 18  Temp: 98.1 F (36.7 C)  SpO2: 96%  Weight: 153 lb 6.4 oz (69.6 kg)  Height: 5\' 11"  (1.803 m)   Body mass index is 21.39 kg/m. Physical Exam  Constitutional: He appears well-developed and well-nourished.  sitting up in bed in NAD, wife at bedside  HENT:  Mouth/Throat: Oropharynx  is clear and moist.  MMM; no oral thrush  Eyes: Pupils are equal, round, and reactive to light. No scleral icterus.  Neck: Neck supple. Carotid bruit is not present. No thyromegaly present.  Cardiovascular: Normal rate, regular rhythm and intact distal pulses. Exam reveals no gallop and no friction rub.  Murmur (1/6 sem) heard. No distal LE edema. No calf TTP  Pulmonary/Chest: Effort normal and breath sounds normal. He has no wheezes. He has no rales. He exhibits no tenderness.  Abdominal: Soft. Normal appearance and bowel sounds are normal. He exhibits no distension, no abdominal bruit, no pulsatile midline mass and no mass. There is no hepatomegaly. There is no tenderness. There is no rigidity, no rebound and no guarding. No hernia.  Musculoskeletal: He exhibits edema (small and large joint) and deformity (small and large joint).       Right ankle: He exhibits decreased range of motion and swelling.  Lymphadenopathy:    He has no cervical adenopathy.  Neurological: He is alert. He exhibits abnormal muscle tone (increase right LE). Gait abnormal.  (+) right foot drop; RLE internally rotated with flexion of knee; grip strength intact b/l; min active ROM of toes on right  Skin: Skin is warm and dry. No rash noted.  Psychiatric: He has a normal mood and affect. His behavior is normal. Thought content normal.    Labs reviewed: Basic  Metabolic Panel: Recent Labs    10/27/17 1146  11/08/17 2251 11/09/17 0159 11/11/17 0651  NA 138   < > 136 137 138  K 3.2*   < > 3.8 4.0 3.1*  CL 104   < > 107 107 110  CO2 23   < > 16* 18* 19*  GLUCOSE 116*   < > 127* 118* 113*  BUN 14   < > 64* 70* 28*  CREATININE 0.86   < > 1.04 1.09 0.82  CALCIUM 10.2   < > 9.8 9.9 9.2  MG 1.2*  --   --  1.0* 1.3*  PHOS  --   --   --  3.0  --    < > = values in this interval not displayed.   Liver Function Tests: Recent Labs    10/30/17 0631 11/08/17 2251 11/09/17 0159  AST 45* 24 24  ALT 34 18 17  ALKPHOS 65 53 52  BILITOT 2.0* 1.4* 1.3*  PROT 5.7* 5.5* 5.6*  ALBUMIN 2.5* 2.6* 2.6*   No results for input(s): LIPASE, AMYLASE in the last 8760 hours. No results for input(s): AMMONIA in the last 8760 hours. CBC: Recent Labs    10/25/17 1503  10/30/17 0631  11/09/17 1555 11/10/17 0815 11/11/17 0651  WBC 15.6*   < > 9.9   < > 18.2* 15.8* 16.3*  NEUTROABS 13.8*  --  7.2  --   --   --   --   HGB 16.7   < > 12.6*   < > 10.4* 9.2* 9.2*  HCT 46.8   < > 36.5*   < > 28.9* 26.3* 26.9*  MCV 97.1   < > 98.1   < > 90.6 92.3 93.4  PLT 215   < > 211   < > 259 220 233   < > = values in this interval not displayed.   Cardiac Enzymes: Recent Labs    10/26/17 0801 10/27/17 1146 10/28/17 0929 11/09/17 0159 11/09/17 1043 11/09/17 1555  CKTOTAL 2,067* 825* 377  --   --   --  TROPONINI 0.17*  --   --  <0.03 <0.03 <0.03   BNP: Invalid input(s): POCBNP Lab Results  Component Value Date   HGBA1C 4.8 10/25/2017   Lab Results  Component Value Date   TSH 10.491 (H) 11/09/2017   Lab Results  Component Value Date   VITAMINB12 186 11/09/2017   Lab Results  Component Value Date   FOLATE 7.7 11/09/2017   Lab Results  Component Value Date   IRON 147 11/09/2017   TIBC 239 (L) 11/09/2017   FERRITIN 617 (H) 11/09/2017    Imaging and Procedures obtained prior to SNF admission: No results found.  Assessment/Plan   ICD-10-CM   1.  Diarrhea, unspecified type R19.7    r/o C diff  2. Anemia, unspecified type D64.9    s/p 2 units PRBCs  3. Duodenal ulcer K26.9   4. History of recent stroke Z86.73    right occipital lobe; bifrontal SAH and subdural  5. Traumatic rhabdomyolysis, sequela T79.6XXS   6. ETOH abuse F10.10   7. Essential hypertension I10   8. Postoperative hypothyroidism E89.0   9. Right foot drop M21.371    2/2 #4  10. Dementia without behavioral disturbance, unspecified dementia type F03.90     ASA/Plavix x 3 weeks then plavix alone (STOP DATE 11/20/17)  START THIAMINE 100mg  daily due to Etoh abuse  Cont other meds as ordered  PT/OT/ST as ordered - he is currently on mechanical soft diet  Therapy to complete home visit next week  F/u with specialists as scheduled. He will need to see CT sx for ascending aortic aneurysm  Etoh use cessation discussed and highly urged  GOAL: short term rehab and d/c home when medically appropriate. Communicated with pt and nursing.  Will follow  Family/ staff Communication: 48 hr Care plan meeting completed today. Code status briefly discussed - he is FULL CODE  Labs/tests ordered: stool for C diff toxin  Darica Goren S. Perlie Gold  [facility] [address] [place], [place] [address] [phone] Cell (Monday-Friday 8 AM - 5 PM) [phone] After 5 PM and follow prompts

## 2017-11-16 DIAGNOSIS — E785 Hyperlipidemia, unspecified: Secondary | ICD-10-CM | POA: Insufficient documentation

## 2017-11-20 ENCOUNTER — Encounter: Payer: Self-pay | Admitting: Adult Health

## 2017-11-20 NOTE — Progress Notes (Signed)
Location:   [facility] Room Number: 120 A Place of Service:  SNF (31)   CODE STATUS: Full Code  No Known Allergies  Chief Complaint  Patient presents with  . Acute Visit    Depression    HPI:    Past Medical History:  Diagnosis Date  . 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 Calculus of kidney 03/31/2013   Overview:  IMPRESSION: see ER note   . Hypertension   . Leukocytosis 10/26/2017  . Loss of weight 10/26/2017  . Multiple leg contusions, unspecified laterality, sequela 10/26/2017  . Scalp hematoma, subsequent encounter 10/26/2017  . Stroke [facility])     Past Surgical History:  Procedure Laterality Date  . ESOPHAGOGASTRODUODENOSCOPY (EGD) WITH PROPOFOL N/A 11/09/2017   Procedure: ESOPHAGOGASTRODUODENOSCOPY (EGD) WITH PROPOFOL;  Surgeon: Laurence Spates, MD;  Location: [place];  Service: Endoscopy;  Laterality: N/A;    Social History   Socioeconomic History  . Marital status: Married    Spouse name: Not on file  . Number of children: Not on file  . Years of education: Not on file  . Highest education level: Not on file  Occupational History  . Not on file  Social Needs  . Financial resource strain: Not on file  . Food insecurity:    Worry: Not on file    Inability: Not on file  . Transportation needs:    Medical: Not on file    Non-medical: Not on file  Tobacco Use  . Smoking status: Never Smoker  . Smokeless tobacco: Never Used  Substance and Sexual Activity  . Alcohol use: Yes    Alcohol/week: 3.6 - 7.2 oz    Types: 6 - 12 Cans of beer per week    Comment: 3-6 alcoholic drinks per day  . Drug use: No  . Sexual activity: Not on file  Lifestyle  . Physical activity:    Days per week: Not on file    Minutes per session: Not on file  . Stress: Not on file  Relationships  . Social connections:    Talks on phone: Not on file    Gets together: Not on file    Attends religious service: Not on file    Active member of club or  organization: Not on file    Attends meetings of clubs or organizations: Not on file    Relationship status: Not on file  . Intimate partner violence:    Fear of current or ex partner: Not on file    Emotionally abused: Not on file    Physically abused: Not on file    Forced sexual activity: Not on file  Other Topics Concern  . Not on file  Social History Narrative  . Not on file   Family History  Problem Relation Age of Onset  . Brain cancer Mother   . Cancer Sister       VITAL SIGNS BP 110/68   Pulse 78   Temp 98 F (36.7 C)   Resp 18   Ht 5\' 11"  (1.803 m)   Wt 153 lb 6.4 oz (69.6 kg)   SpO2 96%   BMI 21.39 kg/m   Outpatient Encounter Medications as of 11/20/2017  Medication Sig  . atorvastatin (LIPITOR) 40 MG tablet Take 1 tablet (40 mg total) by mouth daily at 6 PM.  . clopidogrel (PLAVIX) 75 MG tablet Take 1 tablet (75 mg total) by mouth daily. Start 10/30/17  . levothyroxine (SYNTHROID, LEVOTHROID) 50 MCG  tablet Take 1 tablet (50 mcg total) by mouth daily before breakfast.  . Multiple Vitamin (MULTIVITAMIN WITH MINERALS) TABS tablet Take 1 tablet by mouth daily.  . nitroGLYCERIN (NITROSTAT) 0.4 MG SL tablet Place 1 tablet (0.4 mg total) under the tongue every 5 (five) minutes x 3 doses as needed for chest pain.  . Nutritional Supplements (NUTRITIONAL SUPPLEMENT PO) NAS (no salt added) diet -  Mechanical soft texture, thin liquids consistency  . pantoprazole (PROTONIX) 40 MG tablet Take 1 tablet (40 mg total) by mouth 2 (two) times daily before a meal. After 6 weeks, patient to take once daily.  Marland Kitchen thiamine 100 MG tablet Take 1 tablet (100 mg total) by mouth daily.  . [DISCONTINUED] aspirin 81 MG chewable tablet Chew 1 tablet (81 mg total) by mouth daily. Start on 10/30/17 (Patient not taking: Reported on 11/20/2017)   No facility-administered encounter medications on file as of 11/20/2017.      SIGNIFICANT DIAGNOSTIC EXAMS  TODAY:   10-27-17: ct angio of head and  neck: CT HEAD:   1. Minimal bifrontal subarachnoid hemorrhage, possible tiny subdural hematomas. 2. Moderate to severe chronic small vessel ischemic disease. Old small LEFT cerebellar infarct.   CTA NECK:  1. No hemodynamically significant stenosis or acute vascular process in the neck. 2. Status post probable RIGHT thyroidectomy with complex 19 mm RIGHT thyroid nodule. Recommend thyroid sonogram on nonemergent basis. 3. **An incidental finding of potential clinical significance has been found. 4.3 cm aneurysmal ascending aorta. Recommend annual imaging followup by CTA or MRA.  4. Multilevel severe neural foraminal narrowing and moderate craniocervical canal stenosis. CTA HEAD: 1. No emergent large vessel occlusion. Severe stenosis RIGHT P3 segment. 2. Mild stenosis bilateral supraclinoid internal carotid arteries.  10-01-17: ct of head: 1. Interval resolution of previously seen minimal bifrontal subarachnoid and subdural hemorrhage. No intracranial hemorrhage now identified. 2. No other new acute intracranial process. 3. Stable atrophy with advanced chronic microvascular ischemic Disease.  11-09-17: EGD: normal esophagus. Normal stomach; biopsied, multiple non-bleeding duodenal ulcer no stigmata of bleeding. Biopsied   TODAY:    10-25-17: chol 162; ldl 63; trig 66; hdl 86; hgb a1c 4.8 10-28-17: vit B 12: 210 11-08-17: wbc 14.2; hgb 8.4; hct 24.4; mcv 97.2; plt 336; glucose 127; bun 64; creat 1.04; k+ 3.8; na++ 136; ca 9.8; total bili 1.4; albumin 2.6; vit B 12: 186; iron 147; tibc 239; folate 7.7; ferritin 617 11-09-17: wbc 13.8; hgb 10.7; hct 29.9; mcv 90.6; plt 250; glucose 118; bun 70; creat 1.09; k+ 4.0; na++ 137; ca 9.9; total bili 1.3; albumin 2.6; PT 15.6; INR 1.26;  11-10-17: wbc 15.8; hgb 9.2; hct 26.3; mcv 92.3; plt 220;  11-11-17: wbc 16.7; hgb 9.7; hct 26.9; mcv 93.4 plt 233; glucose 113; bun 28; creat 0.82; k+ 3.1; na++ 138; ca 9.2; mag 1.3    Review of Systems  Unable to  perform ROS: Dementia (confused )    Physical Exam  Constitutional: He appears well-developed and well-nourished. No distress.  Thin   Neck: No thyromegaly present.  Cardiovascular: Normal rate, regular rhythm, normal heart sounds and intact distal pulses.  Pedal pulses faint   Pulmonary/Chest: Effort normal and breath sounds normal. No respiratory distress.  Abdominal: Soft. Bowel sounds are normal. He exhibits no distension. There is no tenderness.  Musculoskeletal: He exhibits no edema.  Is able to move all extremities Has right foot drop Has right side weakness present   Lymphadenopathy:    He has no cervical adenopathy.  Neurological: He is alert.  Skin: Skin is warm and dry. He is not diaphoretic.  Lower legs discolored Has scratches on bilateral lower legs  Psychiatric: He has a normal mood and affect.    ASSESSMENT/ PLAN:  TODAY:   1. Benign essential hypertension: stable b/p 127/72: is currently not on medications; will monitor his status.   2. Embolic cerebral infarction: is neurologically stable: will continue asa 81 mg daily and plavix 75 mg daily   3. Dyslipidemia: stable ldl 63 will continue lipitor 40 mg daily   4. Acute blood loss anemia: stable  hgb 9.7 will monitor   5. Post operative hypothyroidism:status post right thyroidectomy  stable will continue synthroid 50 mcg daily will need endocrinology consult for his thyroid nodule a complex 19 mm RIGHT thyroid nodule.  6. Dysphagia post stroke: is stable no signs of aspiration present will monitor  7.  GI bleed due to NSAIDS: stable no signs of further bleeding: will continue protonix 40 mg twice daily for 6 weeks then 40 mg daily   8.  Dementia without behavioral disturbance: is stable weight is 154 pounds; will not make changes will monitor   9.  Alcoholism: stable was drinking 2-3 drinks per day; will continue thiamine 100 mg daily   10. Aortic aneurysm: stable is 4.3 cm will follow up with  CVTS   Will check cbc; cmp and mag level  Will setup for CVTS and endocrinology consults and cardiology for loop recorder.    MD is aware of resident's narcotic use and is in agreement with current plan of care. We will attempt to wean resident as apropriate    Ok Edwards NP [facility]  Contact [phone] Monday through Friday 8am- 5pm  After hours call [phone]

## 2017-11-23 NOTE — Progress Notes (Signed)
This encounter was created in error - please disregard.

## 2017-11-24 ENCOUNTER — Encounter: Payer: Self-pay | Admitting: Adult Health

## 2017-11-24 ENCOUNTER — Non-Acute Institutional Stay (SKILLED_NURSING_FACILITY): Payer: Medicare Other | Admitting: Adult Health

## 2017-11-24 DIAGNOSIS — I6300 Cerebral infarction due to thrombosis of unspecified precerebral artery: Secondary | ICD-10-CM | POA: Diagnosis not present

## 2017-11-24 DIAGNOSIS — K922 Gastrointestinal hemorrhage, unspecified: Secondary | ICD-10-CM

## 2017-11-24 DIAGNOSIS — F0390 Unspecified dementia without behavioral disturbance: Secondary | ICD-10-CM | POA: Diagnosis not present

## 2017-11-24 NOTE — Progress Notes (Addendum)
Location:   [facility] Room Number: 120 A Place of Service:  SNF (31)    CODE STATUS: Full Code  No Known Allergies  Chief Complaint  Patient presents with  . Discharge Note    Discharging to home on 11/26/17    HPI:  He is being discharged to home with home health for pt/ot/rn. He will need 3:1 commode front wheel walker; right afo, shower bench. He will need his prescriptions written and will need to follow up with his medical provider. He will be going home with family.  He had been hospitalized for GI bleed. He was admitted to this facility being seen for short term rehab and is now ready for discharge.    Past Medical History:  Diagnosis Date  . 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 Calculus of kidney 03/31/2013   Overview:  IMPRESSION: see ER note   . Hypertension   . Leukocytosis 10/26/2017  . Loss of weight 10/26/2017  . Multiple leg contusions, unspecified laterality, sequela 10/26/2017  . Scalp hematoma, subsequent encounter 10/26/2017  . Stroke [facility])     Past Surgical History:  Procedure Laterality Date  . ESOPHAGOGASTRODUODENOSCOPY (EGD) WITH PROPOFOL N/A 11/09/2017   Procedure: ESOPHAGOGASTRODUODENOSCOPY (EGD) WITH PROPOFOL;  Surgeon: Laurence Spates, MD;  Location: [place];  Service: Endoscopy;  Laterality: N/A;    Social History   Socioeconomic History  . Marital status: Married    Spouse name: Not on file  . Number of children: Not on file  . Years of education: Not on file  . Highest education level: Not on file  Occupational History  . Not on file  Social Needs  . Financial resource strain: Not on file  . Food insecurity:    Worry: Not on file    Inability: Not on file  . Transportation needs:    Medical: Not on file    Non-medical: Not on file  Tobacco Use  . Smoking status: Never Smoker  . Smokeless tobacco: Never Used  Substance and Sexual Activity  . Alcohol use: Yes    Alcohol/week: 3.6 - 7.2 oz    Types: 6 -  12 Cans of beer per week    Comment: 3-6 alcoholic drinks per day  . Drug use: No  . Sexual activity: Not on file  Lifestyle  . Physical activity:    Days per week: Not on file    Minutes per session: Not on file  . Stress: Not on file  Relationships  . Social connections:    Talks on phone: Not on file    Gets together: Not on file    Attends religious service: Not on file    Active member of club or organization: Not on file    Attends meetings of clubs or organizations: Not on file    Relationship status: Not on file  . Intimate partner violence:    Fear of current or ex partner: Not on file    Emotionally abused: Not on file    Physically abused: Not on file    Forced sexual activity: Not on file  Other Topics Concern  . Not on file  Social History Narrative  . Not on file   Family History  Problem Relation Age of Onset  . Brain cancer Mother   . Cancer Sister     VITAL SIGNS BP 130/80   Pulse 68   Temp 98.3 F (36.8 C)   Resp 20   Ht 5'  11" (1.803 m)   Wt 149 lb 4.8 oz (67.7 kg)   SpO2 96%   BMI 20.82 kg/m   Patient's Medications  New Prescriptions   No medications on file  Previous Medications   ATORVASTATIN (LIPITOR) 40 MG TABLET    Take 1 tablet (40 mg total) by mouth daily at 6 PM.   CLOPIDOGREL (PLAVIX) 75 MG TABLET    Take 1 tablet (75 mg total) by mouth daily. Start 10/30/17   LEVOTHYROXINE (SYNTHROID, LEVOTHROID) 50 MCG TABLET    Take 1 tablet (50 mcg total) by mouth daily before breakfast.   MULTIPLE VITAMIN (MULTIVITAMIN WITH MINERALS) TABS TABLET    Take 1 tablet by mouth daily.   NITROGLYCERIN (NITROSTAT) 0.4 MG SL TABLET    Place 1 tablet (0.4 mg total) under the tongue every 5 (five) minutes x 3 doses as needed for chest pain.   NUTRITIONAL SUPPLEMENTS (NUTRITIONAL SUPPLEMENT PO)    NAS (no salt added) diet -  Mechanical soft texture, thin liquids consistency   PANTOPRAZOLE (PROTONIX) 40 MG TABLET    Take 1 tablet (40 mg total) by mouth 2 (two)  times daily before a meal. After 6 weeks, patient to take once daily.   THIAMINE 100 MG TABLET    Take 1 tablet (100 mg total) by mouth daily.  Modified Medications   No medications on file  Discontinued Medications   No medications on file     SIGNIFICANT DIAGNOSTIC EXAMS  PREVIOUS:   10-27-17: ct angio of head and neck: CT HEAD:   1. Minimal bifrontal subarachnoid hemorrhage, possible tiny subdural hematomas. 2. Moderate to severe chronic small vessel ischemic disease. Old small LEFT cerebellar infarct.   CTA NECK:  1. No hemodynamically significant stenosis or acute vascular process in the neck. 2. Status post probable RIGHT thyroidectomy with complex 19 mm RIGHT thyroid nodule. Recommend thyroid sonogram on nonemergent basis. 3. **An incidental finding of potential clinical significance has been found. 4.3 cm aneurysmal ascending aorta. Recommend annual imaging followup by CTA or MRA.  4. Multilevel severe neural foraminal narrowing and moderate craniocervical canal stenosis. CTA HEAD: 1. No emergent large vessel occlusion. Severe stenosis RIGHT P3 segment. 2. Mild stenosis bilateral supraclinoid internal carotid arteries.  10-01-17: ct of head: 1. Interval resolution of previously seen minimal bifrontal subarachnoid and subdural hemorrhage. No intracranial hemorrhage now identified. 2. No other new acute intracranial process. 3. Stable atrophy with advanced chronic microvascular ischemic Disease.  11-09-17: EGD: normal esophagus. Normal stomach; biopsied, multiple non-bleeding duodenal ulcer no stigmata of bleeding. Biopsied  NO NEW EXAMS    LABS REVIEWED: PREVIOUS:    10-25-17: chol 162; ldl 63; trig 66; hdl 86; hgb a1c 4.8 10-28-17: vit B 12: 210 11-08-17: wbc 14.2; hgb 8.4; hct 24.4; mcv 97.2; plt 336; glucose 127; bun 64; creat 1.04; k+ 3.8; na++ 136; ca 9.8; total bili 1.4; albumin 2.6; vit B 12: 186; iron 147; tibc 239; folate 7.7; ferritin 617 11-09-17: wbc 13.8; hgb  10.7; hct 29.9; mcv 90.6; plt 250; glucose 118; bun 70; creat 1.09; k+ 4.0; na++ 137; ca 9.9; total bili 1.3; albumin 2.6; PT 15.6; INR 1.26;  11-10-17: wbc 15.8; hgb 9.2; hct 26.3; mcv 92.3; plt 220;  11-11-17: wbc 16.7; hgb 9.7; hct 26.9; mcv 93.4 plt 233; glucose 113; bun 28; creat 0.82; k+ 3.1; na++ 138; ca 9.2; mag 1.3    NO NEW LABS    Review of Systems  Reason unable to perform ROS: poor historian  Constitutional: Negative for malaise/fatigue.  Respiratory: Negative for cough and shortness of breath.   Cardiovascular: Negative for chest pain.  Gastrointestinal: Negative for abdominal pain and constipation.  Musculoskeletal: Negative for back pain.  Skin: Negative.   Psychiatric/Behavioral: The patient is not nervous/anxious.     Physical Exam  Constitutional: He appears well-developed and well-nourished. No distress.  Thin   Neck: No thyromegaly present.  Cardiovascular: Normal rate, regular rhythm, normal heart sounds and intact distal pulses.  Pedal pulses   Pulmonary/Chest: Effort normal and breath sounds normal. No respiratory distress.  Abdominal: Soft. Bowel sounds are normal. He exhibits no distension. There is no tenderness.  Musculoskeletal: He exhibits no edema.  Is able to move all extremities Has right foot drop Has right side weakness present   Lymphadenopathy:    He has no cervical adenopathy.  Neurological: He is alert.  Skin: Skin is warm and dry. He is not diaphoretic.  Lower extremities discolored   Psychiatric: He has a normal mood and affect.    ASSESSMENT/ PLAN:  Patient is being discharged with the following home health services:  Pt/ot/rn: to evaluate and treat as indicated for gait balance strength adl training and medication management. ri  Patient is being discharged with the following durable medical equipment:  Right afo; 3:1 commode, shower bench front wheel walker in order for him to maintain his current level of independence with his  adls.    Patient has been advised to f/u with their PCP in 1-2 weeks to bring them up to date on their rehab stay.  Social services at facility was responsible for arranging this appointment.  Pt was provided with a 30 day supply of prescriptions for medications and refills must be obtained from their PCP.  For controlled substances, a more limited supply may be provided adequate until PCP appointment only.  A 30 day supply of prescription medications have been per list as above.   Time spent with patient 35 minutes: discussed medications; home health needs dme. Verbalized understanding.     Ok Edwards NP [facility]  Contact [phone] Monday through Friday 8am- 5pm  After hours call [phone]

## 2017-12-10 ENCOUNTER — Encounter: Payer: Medicare Other | Admitting: Cardiothoracic Surgery

## 2017-12-23 ENCOUNTER — Other Ambulatory Visit: Payer: Self-pay | Admitting: Adult Health

## 2017-12-26 ENCOUNTER — Other Ambulatory Visit: Payer: Self-pay | Admitting: Adult Health

## 2017-12-29 ENCOUNTER — Encounter: Payer: Self-pay | Admitting: Adult Health

## 2017-12-29 ENCOUNTER — Ambulatory Visit: Payer: Medicare Other | Admitting: Adult Health

## 2017-12-29 VITALS — BP 112/71 | HR 88 | Ht 71.0 in

## 2017-12-29 DIAGNOSIS — I10 Essential (primary) hypertension: Secondary | ICD-10-CM | POA: Diagnosis not present

## 2017-12-29 DIAGNOSIS — I63431 Cerebral infarction due to embolism of right posterior cerebral artery: Secondary | ICD-10-CM | POA: Diagnosis not present

## 2017-12-29 DIAGNOSIS — E785 Hyperlipidemia, unspecified: Secondary | ICD-10-CM

## 2017-12-29 NOTE — Patient Instructions (Signed)
Continue clopidogrel 75 mg daily  and lipitor  for secondary stroke prevention  Continue PT/OT - if you need additional orders for outpatient therapies let us know  Continue to follow up with PCP regarding cholesterol and blood pressure management  Continue to monitor blood pressure at home  Repeat CT angio in 6 months for aneurysm follow up   Maintain strict control of hypertension with blood pressure goal below 130/90, diabetes with hemoglobin A1c goal below 6.5% and cholesterol with LDL cholesterol (bad cholesterol) goal below 70 mg/dL. I also advised the patient to eat a healthy diet with plenty of whole grains, cereals, fruits and vegetables, exercise regularly and maintain ideal body weight.  Followup in the future with me in 4 months or call earlier if needed

## 2017-12-29 NOTE — Progress Notes (Signed)
[facility] [address] [place]. [place] [address] [phone]       OFFICE FOLLOW UP NOTE  [name] Date of Birth:  [birth year] Medical Record Number:  [id number]   Reason for Referral:  hospital stroke follow up  CHIEF COMPLAINT:  Chief Complaint  Patient presents with  . Follow-up    CV follow up from hospital in March 2019, Pt is getting therapy at home, Pt,Ot home    HPI: Ian [name] is being seen today for initial visit in the office for stroke on 10/25/17. History obtained from patient, wife and chart review. Reviewed all radiology images and labs personally.  Ian [name] is an 84 year old male with PMH of HTN who was admitted after reported fall from possible syncopal event at home, then laying multiple hours (approximately 16 hours) lying on the floor.  According to hospital notes, when patient initially fell in the bath of the wife attempted to help him up but the patient refused, she then went to bed allow him to lay on the floor until the following day.  Excess alcohol intake and family dynamics may have played a role in this unusual explanation.  Per wife, patient has a baseline of dementia for the past few years but has never had any formal neurological evaluation for this.  Wife states that he drinks 3-4 times per day with his meals and in the evenings.  He presented to the hospital with rhabdo, leukocytosis and multiple electrolyte and thyroid abnormalities.  Also was experiencing right-sided weakness and dizziness.  CT head showed no acute stroke but did show small vessel disease and multifocal mucosal thickening throughout the paranasal sinuses with the left greater than right possibly suggesting chronic sinusitis.Marland Kitchen  MRI of brain reveals an acute right occipital lobe infarct. Medium please. While patient was undergoing CTA of head and neck 2 days later (10/27/2017), a minimal bifrontal subarachnoid hemorrhage was found.  CTA of neck showed no  hemodynamically significant stenosis or acute vascular process but did show an incidental finding of a 4.3 cm aneurysmal ascending aorta.  CTA of head showed no emergent large vessel occlusion but did show severe stenosis of right P3 segment along with mild stenosis of bilateral supraclinoid internal carotid arteries.  Repeat CT scan done on 10/29/2017 showed resolution of previously seen bifrontal subarachnoid and subdural hemorrhage.  Carotid Doppler negative for ICA stenosis.  2D echo showed EF of 65 to 70% without cardiac source of embolus.  EEG showed diffuse slowing of the waking background but negative for epileptic activity.  LDL 63 and was started on Lipitor 40 mg.  A1c satisfactory at 4.8.  Recommended starting Plavix with evidence of resolution of bleed.  It was determined that right-sided weakness was not result of his right occipital stroke but due to lying on the floor for 16 hours.  Recommended 30-day cardiac monitor as there is a strong suspicion for A. fib.  Patient discharged to CIR in stable condition.  Patient was discharged to SNF facility where he received PT and OT.  Patient was discharged from here to home on 11/24/2017 with home PT.  Since this time, patient continues to improve.  He continues to receive home PT and will be receiving home OT next week.  He is able to ambulate with rolling walker short distances but does use wheelchair for long distance.  He states that his right foot feels "like a brick" and concerned that it is not improving with PT or OT.  Patient continues Plavix with mild bruising but no bleeding.  Continues Lipitor without side effects of myalgias.  Blood pressure today satisfactory 112/71.  Wife is unsure at this time if his memory is worse than it was prior to the stroke as he would wax/wane previously and continues to do so.  It was recommended for patient to undergo 30-day cardiac monitor to rule out A. fib but patient refusing monitor at this time.  Denies new or  worsening stroke/TIA symptoms.  Obtain majority of this information by wife as patient was frustrated by having to be present at this appointment and not wanting to answer all questions asked.  ROS:   14 system review of systems performed and negative with exception of cold intolerance, rectal pain,activity change, fatigue, unexpected weight change, cough, swelling, daytime sleepiness, snoring, incontinence of bladder, frequency of urination, walking difficulty, wounds, rash, itching, bruising easily, weakness, agitation and confusion  PMH:  Past Medical History:  Diagnosis Date  . 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 Calculus of kidney 03/31/2013   Overview:  IMPRESSION: see ER note   . Hypertension   . Leukocytosis 10/26/2017  . Loss of weight 10/26/2017  . Multiple leg contusions, unspecified laterality, sequela 10/26/2017  . Scalp hematoma, subsequent encounter 10/26/2017  . Stroke [facility])     PSH:  Past Surgical History:  Procedure Laterality Date  . ESOPHAGOGASTRODUODENOSCOPY (EGD) WITH PROPOFOL N/A 11/09/2017   Procedure: ESOPHAGOGASTRODUODENOSCOPY (EGD) WITH PROPOFOL;  Surgeon: Laurence Spates, MD;  Location: [facility];  Service: Endoscopy;  Laterality: N/A;    Social History:  Social History   Socioeconomic History  . Marital status: Married    Spouse name: Not on file  . Number of children: Not on file  . Years of education: Not on file  . Highest education level: Not on file  Occupational History  . Not on file  Social Needs  . Financial resource strain: Not on file  . Food insecurity:    Worry: Not on file    Inability: Not on file  . Transportation needs:    Medical: Not on file    Non-medical: Not on file  Tobacco Use  . Smoking status: Never Smoker  . Smokeless tobacco: Never Used  Substance and Sexual Activity  . Alcohol use: Yes    Alcohol/week: 3.6 - 7.2 oz    Types: 6 - 12 Cans of beer per week    Comment: 3-6 alcoholic drinks per day  . Drug  use: No  . Sexual activity: Not on file  Lifestyle  . Physical activity:    Days per week: Not on file    Minutes per session: Not on file  . Stress: Not on file  Relationships  . Social connections:    Talks on phone: Not on file    Gets together: Not on file    Attends religious service: Not on file    Active member of club or organization: Not on file    Attends meetings of clubs or organizations: Not on file    Relationship status: Not on file  . Intimate partner violence:    Fear of current or ex partner: Not on file    Emotionally abused: Not on file    Physically abused: Not on file    Forced sexual activity: Not on file  Other Topics Concern  . Not on file  Social History Narrative  . Not on file    Family History:  Family History  Problem Relation Age of Onset  . Brain cancer Mother   . Cancer Sister     Medications:   Current Outpatient Medications on File Prior to Visit  Medication Sig Dispense Refill  . atorvastatin (LIPITOR) 40 MG tablet Take 1 tablet (40 mg total) by mouth daily at 6 PM. 30 tablet 0  . clopidogrel (PLAVIX) 75 MG tablet Take 1 tablet (75 mg total) by mouth daily. Start 10/30/17 30 tablet 0  . levothyroxine (SYNTHROID, LEVOTHROID) 50 MCG tablet Take 1 tablet (50 mcg total) by mouth daily before breakfast.    . Multiple Vitamin (MULTIVITAMIN WITH MINERALS) TABS tablet Take 1 tablet by mouth daily. 30 tablet 0  . nitroGLYCERIN (NITROSTAT) 0.4 MG SL tablet Place 1 tablet (0.4 mg total) under the tongue every 5 (five) minutes x 3 doses as needed for chest pain. 10 tablet 0  . Nutritional Supplements (NUTRITIONAL SUPPLEMENT PO) NAS (no salt added) diet -  Mechanical soft texture, thin liquids consistency    . pantoprazole (PROTONIX) 40 MG tablet Take 1 tablet (40 mg total) by mouth 2 (two) times daily before a meal. After 6 weeks, patient to take once daily. 84 tablet 0  . thiamine 100 MG tablet Take 1 tablet (100 mg total) by mouth daily. 30 tablet 0     No current facility-administered medications on file prior to visit.     Allergies:  No Known Allergies   Physical Exam  Vitals:   12/29/17 1125  BP: 112/71  Pulse: 88  Height: 5\' 11"  (1.803 m)   Body mass index is 20.82 kg/m. No exam data present  General: well developed, elderly Caucasian male, well nourished, seated, in no evident distress Head: head normocephalic and atraumatic.   Neck: supple with no carotid or supraclavicular bruits Cardiovascular: regular rate and rhythm, no murmurs; +2 pitting edema bilateral lower extremities Musculoskeletal: no deformity Skin:  no rash/petichiae Vascular:  Normal pulses all extremities  Neurologic Exam Mental Status: Awake and fully alert. Attention span, concentration and fund of knowledge appropriate. Mood and affect frustrated and upset.  Patient uncooperative with memory exam. Cranial Nerves: Fundoscopic exam reveals sharp disc margins. Pupils equal, briskly reactive to light. Extraocular movements full without nystagmus. Visual fields full to confrontation. Hearing intact. Facial sensation intact. Face, tongue, palate moves normally and symmetrically.  Motor: Normal bulk and tone. Normal strength in all tested extremity muscles.  Mild weakness present with right leg and right foot dorsiflexion  Sensory.: intact to touch , pinprick , position and vibratory sensation.  Coordination: Rapid alternating movements normal in all extremities. Finger-to-nose and heel-to-shin performed accurately on left side Gait and Station: Unable to test gait due to patient having difficult time ambulating due to increased fatigue from coming to appointment. Reflexes: 1+ and symmetric. Toes downgoing.    NIHSS  0 Modified Rankin  4   Diagnostic Data (Labs, Imaging, Testing)  CT HEAD WO CONTRAST 10/25/17 IMPRESSION: 1. Soft tissue swelling in the scalp near the vertex, presumably a hematoma. No acute intracranial abnormalities. 2. Moderate  cerebral and cerebellar atrophy with extensive chronic microvascular ischemic changes in the cerebral white matter.  MR BRAIN W WO CONTRAST 10/25/17 IMPRESSION: 1. 12 mm focus of reduced diffusion in right occipital lobe, probably acute/early subacute infarct. No hemorrhage or mass effect. 2. Thin smooth diffuse pachymeningeal thickening without nodularity. This can be seen in the setting of head trauma or meningitis. No additional findings for differential considerations of intracranial hypotension, mass infiltration, granulomatous disease, or  hypertrophic pachymeningitis. 3. Moderate chronic microvascular ischemic changes and moderate parenchymal volume loss of the brain. 4. Extensive paranasal sinus disease with fluid levels which may represent acute sinusitis.  CT ANGIO NECK W WO CONTRAST CT ANGIO HEAD W WO CONTRAST 10/25/17 IMPRESSION: CT HEAD: 1. Minimal bifrontal subarachnoid hemorrhage, possible tiny subdural hematomas. 2. Moderate to severe chronic small vessel ischemic disease. Old small LEFT cerebellar infarct. CTA NECK: 1. No hemodynamically significant stenosis or acute vascular process in the neck. 2. Status post probable RIGHT thyroidectomy with complex 19 mm RIGHT thyroid nodule. Recommend thyroid sonogram on nonemergent basis. This follows ACR consensus guidelines: Managing Incidental Thyroid Nodules Detected on Imaging: White Paper of the ACR Incidental Findings Committee. J Am Coll Radiol 2015; 12:143-150. 3. **An incidental finding of potential clinical significance has been found. 4.3 cm aneurysmal ascending aorta. Recommend annual imaging followup by CTA or MRA. This recommendation follows 2010 ACCF/AHA/AATS/ACR/ASA/SCA/SCAI/SIR/STS/SVM Guidelines for the Diagnosis and Management of Patients with Thoracic Aortic Disease. Circulation. 2010; 121: H086-V784** 4. Multilevel severe neural foraminal narrowing and moderate craniocervical canal stenosis. CTA  HEAD: 1. No emergent large vessel occlusion. Severe stenosis RIGHT P3 segment. 2. Mild stenosis bilateral supraclinoid internal carotid arteries.  ECHOCARDIOGRAM COMPLETE 10/26/17 Study Conclusions - Left ventricle: The cavity size was normal. There was mild   concentric hypertrophy. Systolic function was vigorous. The   estimated ejection fraction was in the range of 65% to 70%. Wall   motion was normal; there were no regional wall motion   abnormalities. Doppler parameters are consistent with abnormal   left ventricular relaxation (grade 1 diastolic dysfunction).   Doppler parameters are consistent with elevated ventricular   end-diastolic filling pressure. - Aortic valve: There was no regurgitation. - Mitral valve: Calcified annulus. Valve area by pressure   half-time: 2.27 cm^2. - Right ventricle: The cavity size was normal. Wall thickness was   normal. Systolic function was normal. - Right atrium: The atrium was normal in size. - Tricuspid valve: There was no regurgitation. - Pulmonary arteries: Systolic pressure could not be accurately   estimated. - Inferior vena cava: The vessel was normal in size. - Pericardium, extracardiac: There was no pericardial effusion.  EEG ADULT 10/27/17 Impression: This awake and drowsy EEG is abnormal due to diffuse slowing of the waking background.  US CAROTID DUPLEX BILATERAL 10/27/17 Final Interpretation: Right Carotid: There is no evidence of stenosis in the right ICA. Left Carotid: There is no evidence of stenosis in the left ICA. Vertebrals: Both vertebral arteries were patent with antegrade flow.  [place] LOWER EXTREMITY VENOUS BILAT 10/30/17 Final Interpretation: Right: There is no evidence of deep vein thrombosis in the lower extremity.There is no evidence of superficial venous thrombosis. No cystic structure found in the popliteal fossa. Left: There is no evidence of deep vein thrombosis in the lower extremity.There is no evidence of  superficial venous thrombosis. No cystic structure found in the popliteal fossa.    ASSESSMENT: Ian [name] is a 84 y.o. year old male here with right occipital stroke on 10/25/17 secondary to embolic from unknown source. Vascular risk factors include HTN and HLD.     PLAN: -Continue clopidogrel 75 mg daily  and lipitor  for secondary stroke prevention -F/u with PCP regarding your HTN and HLD management -consider 30 day monitor at next visit if patient willing -repeat CTA neck at next visit for f/u on aneurysmal ascending aorta -continue to monitor BP at home -continue home PT/OT - if additional orders needed for continuing or  outpatient therapies, wife advised to notify and we will place orders -Maintain strict control of hypertension with blood pressure goal below 130/90, diabetes with hemoglobin A1c goal below 6.5% and cholesterol with LDL cholesterol (bad cholesterol) goal below 70 mg/dL. I also advised the patient to eat a healthy diet with plenty of whole grains, cereals, fruits and vegetables, exercise regularly and maintain ideal body weight.  Follow up in 4 months or call earlier if needed   Greater than 50% time during this 25 minute consultation visit was spent on counseling and coordination of care about HLD, and HTN, discussion about risk benefit of anticoagulation and answering questions.     Venancio Poisson, AGNP-BC  [facility] [address] [place], [place] [address]  Phone [phone] Fax [phone]

## 2017-12-29 NOTE — Progress Notes (Signed)
I agree with the above plan 

## 2018-01-02 ENCOUNTER — Other Ambulatory Visit: Payer: Self-pay | Admitting: *Deleted

## 2018-01-02 DIAGNOSIS — I69359 Hemiplegia and hemiparesis following cerebral infarction affecting unspecified side: Secondary | ICD-10-CM

## 2018-01-08 ENCOUNTER — Ambulatory Visit (INDEPENDENT_AMBULATORY_CARE_PROVIDER_SITE_OTHER): Payer: Medicare Other | Admitting: Neurology

## 2018-01-08 DIAGNOSIS — G5701 Lesion of sciatic nerve, right lower limb: Secondary | ICD-10-CM

## 2018-01-08 DIAGNOSIS — I69359 Hemiplegia and hemiparesis following cerebral infarction affecting unspecified side: Secondary | ICD-10-CM | POA: Diagnosis not present

## 2018-01-08 DIAGNOSIS — G6289 Other specified polyneuropathies: Secondary | ICD-10-CM | POA: Diagnosis not present

## 2018-01-08 NOTE — Procedures (Signed)
[facility]  [address], [place]  [place], [place] [address] Tel: [phone] Fax:  [phone] Test Date:  01/08/2018  Patient: Ian [name] DOB: 02-21-1926 Physician: Narda Amber, DO  Sex: Male Height: 5\' 11"  Ref Phys: Rhina Brackett, MD  ID#: [id number] Temp: 33.1C Technician:    Patient Complaints: This is an 84 year old man with recent history of right occipital stroke in March 2019 referred for evaluation of right leg weakness. He was found on the bathroom floor where he had been laying on his right side for a prolonged period of time.  NCV & EMG Findings: Extensive electrodiagnostic testing of the right lower extremity and additional studies of the left shows:  1. Bilateral sural and superficial peroneal sensory responses are absent. 2. Bilateral tibial and right peroneal motor responses are absent. Left peroneal motor response shows severely reduced amplitude at the extensor digitorum brevis, and mildly reduced amplitude at the tibialis anterior. 3. Right tibial H reflex study is absent. 4. Despite maximal activation, no motor unit recruitment is seen in the tibialis anterior and flexor digitorum longus muscles. Rapid recruitment pattern is present in the gastrocnemius and biceps femoris short head, and semimembranosus muscles. Active fibrillation potentials are seen in all of these muscles. Motor unit configuration and recruitment pattern of the rectus femoris and gluteus medius muscles is within normal limits. There is no evidence of active denervation in the lumbar paraspinal muscles.   Impression: The electrophysiologic findings are consistent with an acute right sciatic neuropathy at or proximal to the takeoff to the semimembranosus muscle, which is very severe in degree electrically.  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  ___________________________ Narda Amber, DO    Nerve  Conduction Studies Anti Sensory Summary Table   Site NR Peak (ms) Norm Peak (ms) P-T Amp (V) Norm P-T Amp  Left Sup Peroneal Anti Sensory (Ant Lat Mall)  33.1C  12 cm NR  <4.6  >3  Right Sup Peroneal Anti Sensory (Ant Lat Mall)  33.1C    Lower extremity edema  12 cm NR  <4.6  >3  Left Sural Anti Sensory (Lat Mall)  33.1C  Calf NR  <4.6  >3  Right Sural Anti Sensory (Lat Mall)  33.1C  Calf NR  <4.6  >3   Motor Summary Table   Site NR Onset (ms) Norm Onset (ms) O-P Amp (mV) Norm O-P Amp Site1 Site2 Delta-0 (ms) Dist (cm) Vel (m/s) Norm Vel (m/s)  Left Peroneal Motor (Ext Dig Brev)  33.1C  Ankle    4.1 <6.0 0.6 >2.5 B Fib Ankle 8.2 34.0 41 >40  B Fib    12.3  0.4  Poplt B Fib 1.5 8.0 53 >40  Poplt    13.8  0.5         Right Peroneal Motor (Ext Dig Brev)  33.1C  Ankle NR  <6.0  >2.5 B Fib Ankle  0.0  >40  B Fib NR     Poplt B Fib  0.0  >40  Poplt NR            Left Peroneal TA Motor (Tib Ant)  33.1C  Fib Head    3.3 <4.5 2.7 >3 Poplit Fib Head 1.1 8.0 73 >40  Poplit    4.4  2.5         Right Peroneal TA Motor (Tib Ant)  33.1C  Fib Head NR  <4.5  >3 Poplit Fib Head  0.0  >  40  Poplit NR            Left Tibial Motor (Abd Hall Brev)  33.1C  Ankle NR  <6.0  >4 Knee Ankle  0.0  >40  Knee NR            Right Tibial Motor (Abd Hall Brev)  33.1C  Ankle NR  <6.0  >4 Knee Ankle  0.0  >40  Knee NR             H Reflex Studies   NR H-Lat (ms) Lat Norm (ms) L-R H-Lat (ms)  Right Tibial (Gastroc)  33.1C  NR  <35    EMG   Side Muscle Ins Act Fibs Psw Fasc Number Recrt Dur Dur. Amp Amp. Poly Poly. Comment  Right AntTibialis Nml 2+ Nml Nml None None - - - - - - ATR  Right Gastroc Nml 1+ Nml Nml [facility] Rapid Nml Nml Nml Nml Nml Nml N/A  Right Flex Dig Long Nml 1+ Nml Nml None None - - - - - - N/A  Right RectFemoris Nml Nml Nml Nml Nml Nml Nml Nml Nml Nml Nml Nml N/A  Right GluteusMed Nml Nml Nml Nml Nml Nml Nml Nml Nml Nml Nml Nml N/A  Right BicepsFemS Nml 1+ Nml Nml 3- Rapid Nml  Nml Nml Nml Nml Nml N/A  Right Semimembranosus Nml 1+ Nml Nml 2- Rapid Nml Nml Nml Nml Nml Nml N/A  Right LumbarPSP Nml 1+ Nml Nml NE - - - - - - - N/A     Waveforms:

## 2018-01-16 ENCOUNTER — Other Ambulatory Visit: Payer: Self-pay | Admitting: Family Medicine

## 2018-01-16 DIAGNOSIS — M25571 Pain in right ankle and joints of right foot: Secondary | ICD-10-CM

## 2018-01-20 ENCOUNTER — Ambulatory Visit
Admission: RE | Admit: 2018-01-20 | Discharge: 2018-01-20 | Disposition: A | Discharge status: Home / Self Care | Payer: Medicare Other | Source: Ambulatory Visit | Attending: Family Medicine | Admitting: Family Medicine

## 2018-01-20 DIAGNOSIS — M25571 Pain in right ankle and joints of right foot: Secondary | ICD-10-CM

## 2018-01-21 ENCOUNTER — Other Ambulatory Visit: Payer: Self-pay | Admitting: Family Medicine

## 2018-01-21 DIAGNOSIS — M545 Low back pain, unspecified: Secondary | ICD-10-CM

## 2018-01-30 ENCOUNTER — Ambulatory Visit
Admission: RE | Admit: 2018-01-30 | Discharge: 2018-01-30 | Disposition: A | Discharge status: Home / Self Care | Payer: Medicare Other | Source: Ambulatory Visit | Attending: Family Medicine | Admitting: Family Medicine

## 2018-01-30 DIAGNOSIS — M545 Low back pain, unspecified: Secondary | ICD-10-CM

## 2018-02-06 ENCOUNTER — Encounter: Payer: Self-pay | Admitting: Neurology

## 2018-02-12 DIAGNOSIS — K269 Duodenal ulcer, unspecified as acute or chronic, without hemorrhage or perforation: Secondary | ICD-10-CM | POA: Insufficient documentation

## 2018-02-12 DIAGNOSIS — F1010 Alcohol abuse, uncomplicated: Secondary | ICD-10-CM | POA: Insufficient documentation

## 2018-02-12 DIAGNOSIS — M21371 Foot drop, right foot: Secondary | ICD-10-CM | POA: Insufficient documentation

## 2018-02-12 DIAGNOSIS — Z8673 Personal history of transient ischemic attack (TIA), and cerebral infarction without residual deficits: Secondary | ICD-10-CM | POA: Insufficient documentation

## 2018-02-12 DIAGNOSIS — D649 Anemia, unspecified: Secondary | ICD-10-CM | POA: Insufficient documentation

## 2018-04-30 NOTE — Progress Notes (Deleted)
[facility] [address] [place]. [place] [address] [phone]       OFFICE FOLLOW UP NOTE  [name] Date of Birth:  Mar 12, 1926 Medical Record Number:  [id number]   Reason for Referral:  hospital stroke follow up  CHIEF COMPLAINT:  No chief complaint on file.   HPI: Ian [name] is being seen today for initial visit in the office for stroke on 10/25/17. History obtained from patient, wife and chart review. Reviewed all radiology images and labs personally.  Ian [name] is an 84 year old male with PMH of HTN who was admitted after reported fall from possible syncopal event at home, then laying multiple hours (approximately 16 hours) lying on the floor. Found to have right sided weakness, rhabdo, leukocytosis and multiple electrolyte abnormalities. CT head showed no acute stroke but did show small vessel disease and multifocal mucosal thickening throughout the paranasal sinuses with the left greater than right possibly suggesting chronic sinusitis.Marland Kitchen  MRI of brain reveals an acute right occipital lobe infarct. While patient was undergoing CTA of head and neck 2 days later (10/27/2017), a minimal bifrontal subarachnoid hemorrhage was found.  CTA of neck showed no hemodynamically significant stenosis or acute vascular process but did show an incidental finding of a 4.3 cm aneurysmal ascending aorta.  CTA of head showed no emergent large vessel occlusion but did show severe stenosis of right P3 segment along with mild stenosis of bilateral supraclinoid internal carotid arteries.  Repeat CT scan done on 10/29/2017 showed resolution of previously seen bifrontal subarachnoid and subdural hemorrhage.  Carotid Doppler negative for ICA stenosis.  2D echo showed EF of 65 to 70% without cardiac source of embolus.  EEG showed diffuse slowing of the waking background but negative for epileptic activity.  LDL 63 and was started on Lipitor 40 mg.  A1c satisfactory at 4.8.  Recommended starting  Plavix with evidence of resolution of bleed.  It was determined that right-sided weakness was not result of his right occipital stroke but due to lying on the floor for 16 hours.  Recommended 30-day cardiac monitor as there is a strong suspicion for A. fib.  Patient discharged to CIR in stable condition.  Patient was discharged to SNF facility where he received PT and OT.  Patient was discharged from here to home on 11/24/2017 with home PT.  12/29/17 visit: Since this time, patient continues to improve.  He continues to receive home PT and will be receiving home OT next week.  He is able to ambulate with rolling walker short distances but does use wheelchair for long distance.  He states that his right foot feels "like a brick" and concerned that it is not improving with PT or OT.  Patient continues Plavix with mild bruising but no bleeding.  Continues Lipitor without side effects of myalgias.  Blood pressure today satisfactory 112/71.  Wife is unsure at this time if his memory is worse than it was prior to the stroke as he would wax/wane previously and continues to do so.  It was recommended for patient to undergo 30-day cardiac monitor to rule out A. fib but patient refusing monitor at this time.  Denies new or worsening stroke/TIA symptoms.  Obtain majority of this information by wife as patient was frustrated by having to be present at this appointment and not wanting to answer all questions asked.  Interval history 04/30/18:    ROS:   14 system review of systems performed and negative with exception of cold intolerance,  rectal pain,activity change, fatigue, unexpected weight change, cough, swelling, daytime sleepiness, snoring, incontinence of bladder, frequency of urination, walking difficulty, wounds, rash, itching, bruising easily, weakness, agitation and confusion  PMH:  Past Medical History:  Diagnosis Date  . 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 Calculus of kidney 03/31/2013    Overview:  IMPRESSION: see ER note   . Hypertension   . Leukocytosis 10/26/2017  . Loss of weight 10/26/2017  . Multiple leg contusions, unspecified laterality, sequela 10/26/2017  . Scalp hematoma, subsequent encounter 10/26/2017  . [facility])     PSH:  Past Surgical History:  Procedure Laterality Date  . ESOPHAGOGASTRODUODENOSCOPY (EGD) WITH PROPOFOL N/A 11/09/2017   Procedure: ESOPHAGOGASTRODUODENOSCOPY (EGD) WITH PROPOFOL;  Surgeon: Laurence Spates, MD;  Location: [place];  Service: Endoscopy;  Laterality: N/A;    Social History:  Social History   Socioeconomic History  . Marital status: Married    Spouse name: Not on file  . Number of children: Not on file  . Years of education: Not on file  . Highest education level: Not on file  Occupational History  . Not on file  Social Needs  . Financial resource strain: Not on file  . Food insecurity:    Worry: Not on file    Inability: Not on file  . Transportation needs:    Medical: Not on file    Non-medical: Not on file  Tobacco Use  . Smoking status: Never Smoker  . Smokeless tobacco: Never Used  Substance and Sexual Activity  . Alcohol use: Yes    Alcohol/week: 6.0 - 12.0 standard drinks    Types: 6 - 12 Cans of beer per week    Comment: 3-6 alcoholic drinks per day  . Drug use: No  . Sexual activity: Not on file  Lifestyle  . Physical activity:    Days per week: Not on file    Minutes per session: Not on file  . Stress: Not on file  Relationships  . Social connections:    Talks on phone: Not on file    Gets together: Not on file    Attends religious service: Not on file    Active member of club or organization: Not on file    Attends meetings of clubs or organizations: Not on file    Relationship status: Not on file  . Intimate partner violence:    Fear of current or ex partner: Not on file    Emotionally abused: Not on file    Physically abused: Not on file    Forced sexual activity: Not on file  Other  Topics Concern  . Not on file  Social History Narrative  . Not on file    Family History:  Family History  Problem Relation Age of Onset  . Brain cancer Mother   . Cancer Sister     Medications:   Current Outpatient Medications on File Prior to Visit  Medication Sig Dispense Refill  . atorvastatin (LIPITOR) 40 MG tablet Take 1 tablet (40 mg total) by mouth daily at 6 PM. 30 tablet 0  . clopidogrel (PLAVIX) 75 MG tablet Take 1 tablet (75 mg total) by mouth daily. Start 10/30/17 30 tablet 0  . levothyroxine (SYNTHROID, LEVOTHROID) 50 MCG tablet Take 1 tablet (50 mcg total) by mouth daily before breakfast.    . Multiple Vitamin (MULTIVITAMIN WITH MINERALS) TABS tablet Take 1 tablet by mouth daily. 30 tablet 0  . nitroGLYCERIN (NITROSTAT) 0.4 MG SL tablet Place  1 tablet (0.4 mg total) under the tongue every 5 (five) minutes x 3 doses as needed for chest pain. 10 tablet 0  . Nutritional Supplements (NUTRITIONAL SUPPLEMENT PO) NAS (no salt added) diet -  Mechanical soft texture, thin liquids consistency    . pantoprazole (PROTONIX) 40 MG tablet Take 1 tablet (40 mg total) by mouth 2 (two) times daily before a meal. After 6 weeks, patient to take once daily. 84 tablet 0  . thiamine 100 MG tablet Take 1 tablet (100 mg total) by mouth daily. 30 tablet 0   No current facility-administered medications on file prior to visit.     Allergies:  No Known Allergies   Physical Exam  There were no vitals filed for this visit. There is no height or weight on file to calculate BMI. No exam data present  General: well developed, elderly Caucasian male, well nourished, seated, in no evident distress Head: head normocephalic and atraumatic.   Neck: supple with no carotid or supraclavicular bruits Cardiovascular: regular rate and rhythm, no murmurs; +2 pitting edema bilateral lower extremities Musculoskeletal: no deformity Skin:  no rash/petichiae Vascular:  Normal pulses all  extremities  Neurologic Exam Mental Status: Awake and fully alert. Attention span, concentration and fund of knowledge appropriate. Mood and affect frustrated and upset.  Patient uncooperative with memory exam. Cranial Nerves: Fundoscopic exam reveals sharp disc margins. Pupils equal, briskly reactive to light. Extraocular movements full without nystagmus. Visual fields full to confrontation. Hearing intact. Facial sensation intact. Face, tongue, palate moves normally and symmetrically.  Motor: Normal bulk and tone. Normal strength in all tested extremity muscles.  Mild weakness present with right leg and right foot dorsiflexion  Sensory.: intact to touch , pinprick , position and vibratory sensation.  Coordination: Rapid alternating movements normal in all extremities. Finger-to-nose and heel-to-shin performed accurately on left side Gait and Station: Unable to test gait due to patient having difficult time ambulating due to increased fatigue from coming to appointment. Reflexes: 1+ and symmetric. Toes downgoing.    NIHSS  0 Modified Rankin  4   Diagnostic Data (Labs, Imaging, Testing)  CT HEAD WO CONTRAST 10/25/17 IMPRESSION: 1. Soft tissue swelling in the scalp near the vertex, presumably a hematoma. No acute intracranial abnormalities. 2. Moderate cerebral and cerebellar atrophy with extensive chronic microvascular ischemic changes in the cerebral white matter.  MR BRAIN W WO CONTRAST 10/25/17 IMPRESSION: 1. 12 mm focus of reduced diffusion in right occipital lobe, probably acute/early subacute infarct. No hemorrhage or mass effect. 2. Thin smooth diffuse pachymeningeal thickening without nodularity. This can be seen in the setting of head trauma or meningitis. No additional findings for differential considerations of intracranial hypotension, mass infiltration, granulomatous disease, or hypertrophic pachymeningitis. 3. Moderate chronic microvascular ischemic changes and  moderate parenchymal volume loss of the brain. 4. Extensive paranasal sinus disease with fluid levels which may represent acute sinusitis.  CT ANGIO NECK W WO CONTRAST CT ANGIO HEAD W WO CONTRAST 10/25/17 IMPRESSION: CT HEAD: 1. Minimal bifrontal subarachnoid hemorrhage, possible tiny subdural hematomas. 2. Moderate to severe chronic small vessel ischemic disease. Old small LEFT cerebellar infarct. CTA NECK: 1. No hemodynamically significant stenosis or acute vascular process in the neck. 2. Status post probable RIGHT thyroidectomy with complex 19 mm RIGHT thyroid nodule. Recommend thyroid sonogram on nonemergent basis. This follows ACR consensus guidelines: Managing Incidental Thyroid Nodules Detected on Imaging: White Paper of the ACR Incidental Findings Committee. J Am Coll Radiol 2015; 12:143-150. 3. **An incidental finding of  potential clinical significance has been found. 4.3 cm aneurysmal ascending aorta. Recommend annual imaging followup by CTA or MRA. This recommendation follows 2010 ACCF/AHA/AATS/ACR/ASA/SCA/SCAI/SIR/STS/SVM Guidelines for the Diagnosis and Management of Patients with Thoracic Aortic Disease. Circulation. 2010; 121: Q734-L937** 4. Multilevel severe neural foraminal narrowing and moderate craniocervical canal stenosis. CTA HEAD: 1. No emergent large vessel occlusion. Severe stenosis RIGHT P3 segment. 2. Mild stenosis bilateral supraclinoid internal carotid arteries.  ECHOCARDIOGRAM COMPLETE 10/26/17 Study Conclusions - Left ventricle: The cavity size was normal. There was mild   concentric hypertrophy. Systolic function was vigorous. The   estimated ejection fraction was in the range of 65% to 70%. Wall   motion was normal; there were no regional wall motion   abnormalities. Doppler parameters are consistent with abnormal   left ventricular relaxation (grade 1 diastolic dysfunction).   Doppler parameters are consistent with elevated ventricular    end-diastolic filling pressure. - Aortic valve: There was no regurgitation. - Mitral valve: Calcified annulus. Valve area by pressure   half-time: 2.27 cm^2. - Right ventricle: The cavity size was normal. Wall thickness was   normal. Systolic function was normal. - Right atrium: The atrium was normal in size. - Tricuspid valve: There was no regurgitation. - Pulmonary arteries: Systolic pressure could not be accurately   estimated. - Inferior vena cava: The vessel was normal in size. - Pericardium, extracardiac: There was no pericardial effusion.  EEG ADULT 10/27/17 Impression: This awake and drowsy EEG is abnormal due to diffuse slowing of the waking background.  US CAROTID DUPLEX BILATERAL 10/27/17 Final Interpretation: Right Carotid: There is no evidence of stenosis in the right ICA. Left Carotid: There is no evidence of stenosis in the left ICA. Vertebrals: Both vertebral arteries were patent with antegrade flow.  [place] LOWER EXTREMITY VENOUS BILAT 10/30/17 Final Interpretation: Right: There is no evidence of deep vein thrombosis in the lower extremity.There is no evidence of superficial venous thrombosis. No cystic structure found in the popliteal fossa. Left: There is no evidence of deep vein thrombosis in the lower extremity.There is no evidence of superficial venous thrombosis. No cystic structure found in the popliteal fossa.    ASSESSMENT: Ian [name] is a 84 y.o. year old male here with right occipital stroke on 10/25/17 secondary to embolic from unknown source. Vascular risk factors include HTN and HLD.     PLAN: -Continue clopidogrel 75 mg daily  and lipitor  for secondary stroke prevention -F/u with PCP regarding your HTN and HLD management -consider 30 day monitor at next visit if patient willing -repeat CTA neck at next visit for f/u on aneurysmal ascending aorta -continue to monitor BP at home -continue home PT/OT - if additional orders needed for continuing or  outpatient therapies, wife advised to notify and we will place orders -Maintain strict control of hypertension with blood pressure goal below 130/90, diabetes with hemoglobin A1c goal below 6.5% and cholesterol with LDL cholesterol (bad cholesterol) goal below 70 mg/dL. I also advised the patient to eat a healthy diet with plenty of whole grains, cereals, fruits and vegetables, exercise regularly and maintain ideal body weight.  Follow up in 4 months or call earlier if needed   Greater than 50% time during this 25 minute consultation visit was spent on counseling and coordination of care about HLD, and HTN, discussion about risk benefit of anticoagulation and answering questions.     Venancio Poisson, AGNP-BC  [facility] [address] [place], [place] [address]  Phone [phone] Fax  [phone]      

## 2018-05-01 ENCOUNTER — Ambulatory Visit: Payer: Medicare Other | Admitting: Adult Health

## 2018-05-04 ENCOUNTER — Encounter: Payer: Self-pay | Admitting: Adult Health

## 2018-05-15 ENCOUNTER — Ambulatory Visit: Payer: Medicare Other | Admitting: Neurology

## 2018-05-15 ENCOUNTER — Encounter

## 2018-05-18 ENCOUNTER — Ambulatory Visit: Payer: Medicare Other | Admitting: Neurology

## 2019-07-24 IMAGING — DX DG HIP (WITH OR WITHOUT PELVIS) 3-4V BILAT
5 series · 5 of 5 positions shown · non-contrast
Comparison: None.

CLINICAL DATA: Pain after fall

EXAM:
DG HIP (WITH OR WITHOUT PELVIS) 3-4V BILAT

[hip ap (1 of 2)]
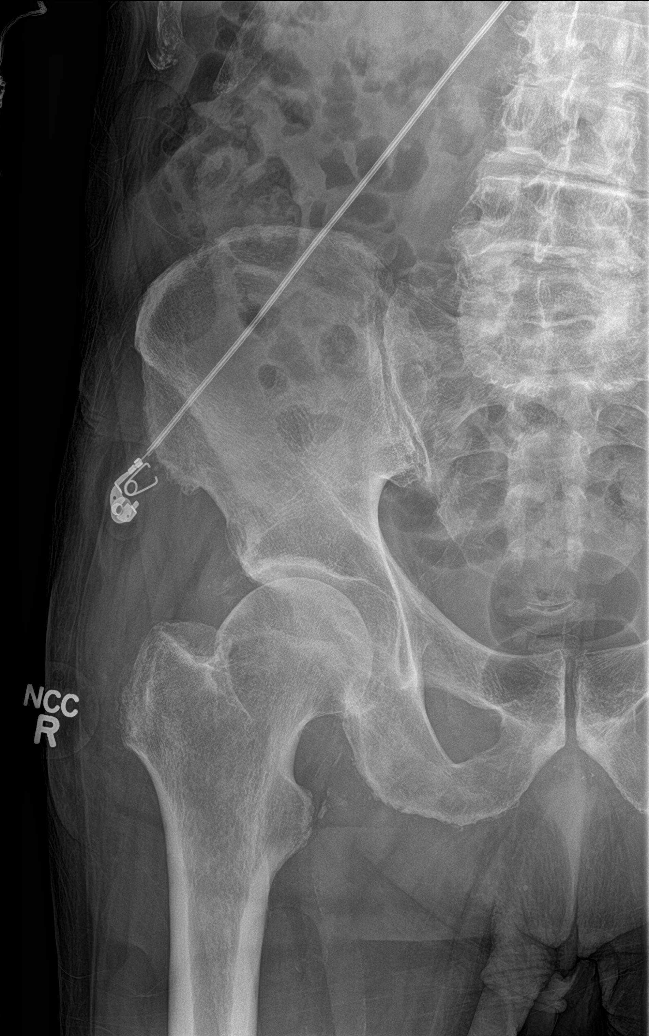

[hip lat (1 of 2)]
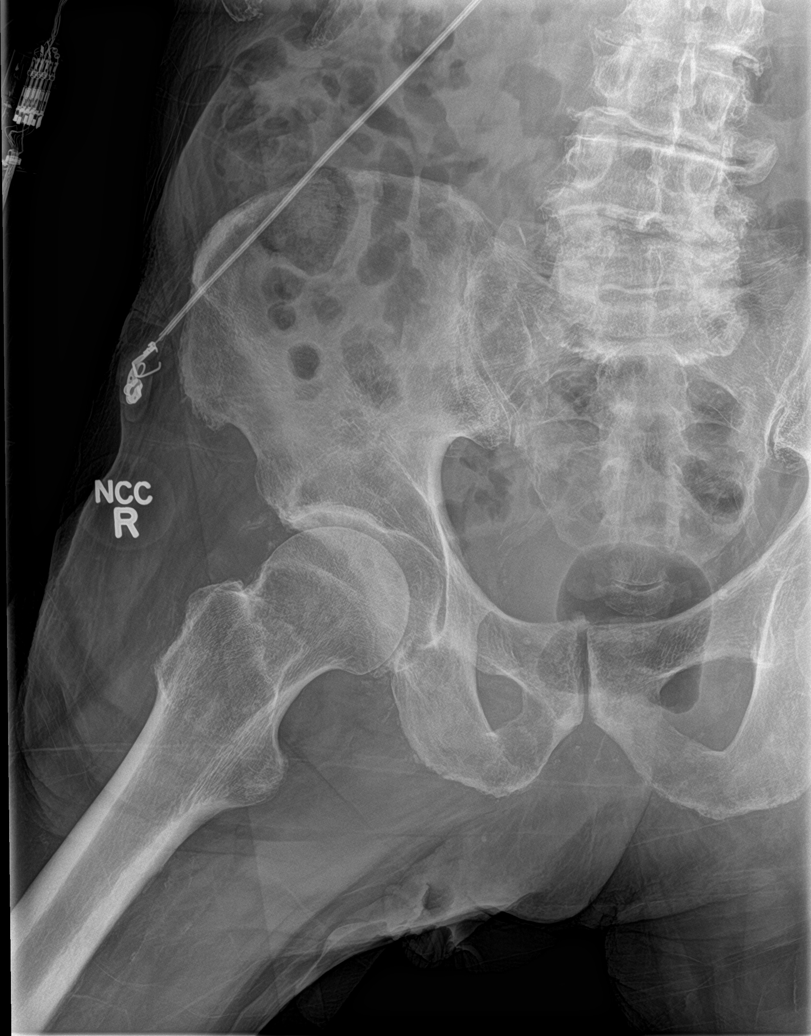

[hip ap (2 of 2)]
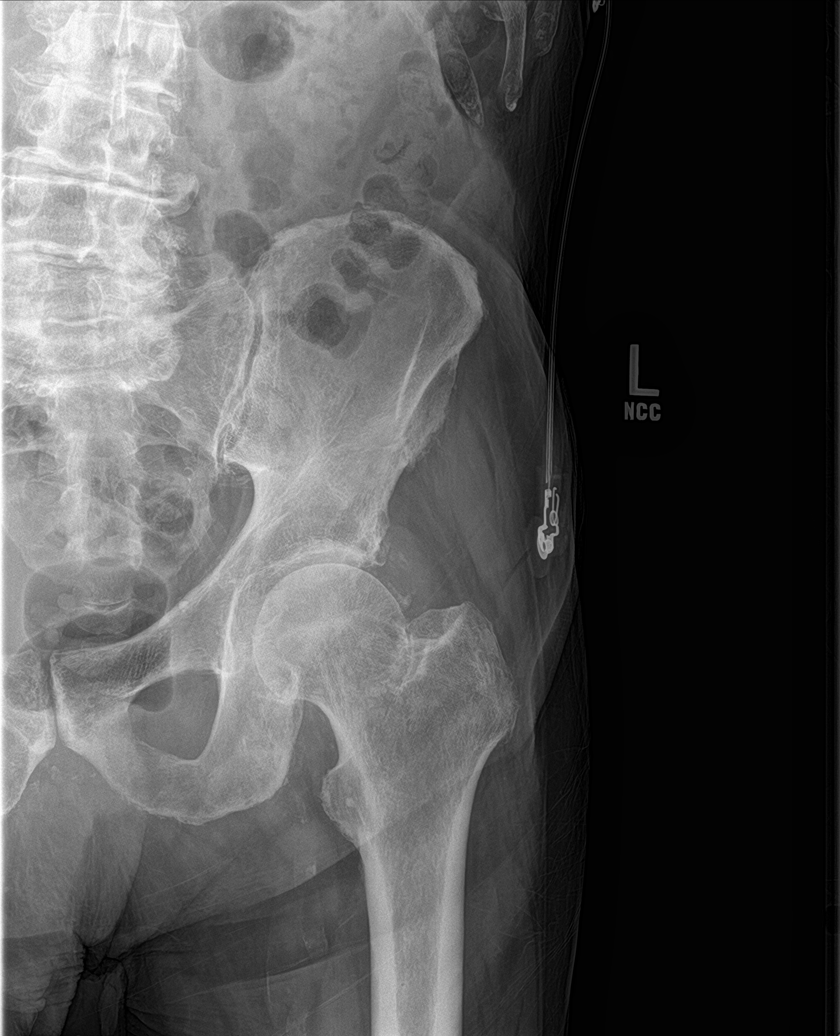

[hip lat (2 of 2)]
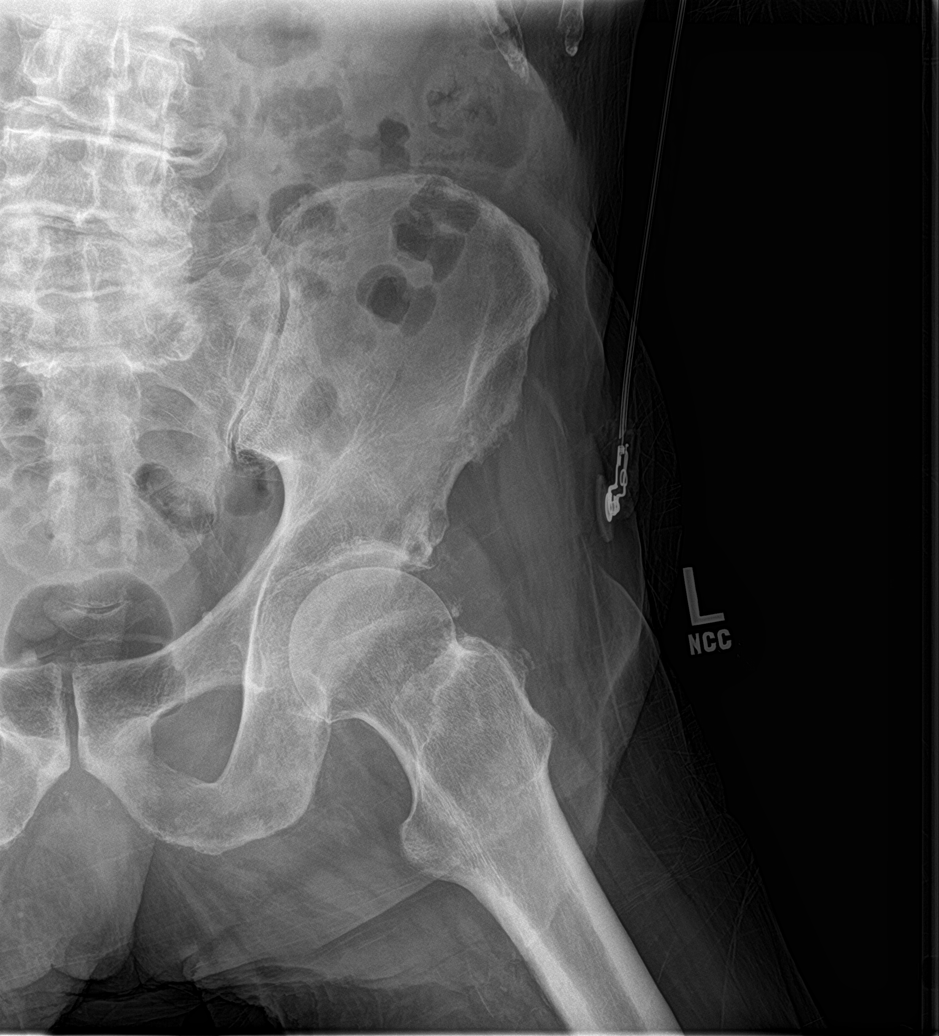

[pelvis ap]
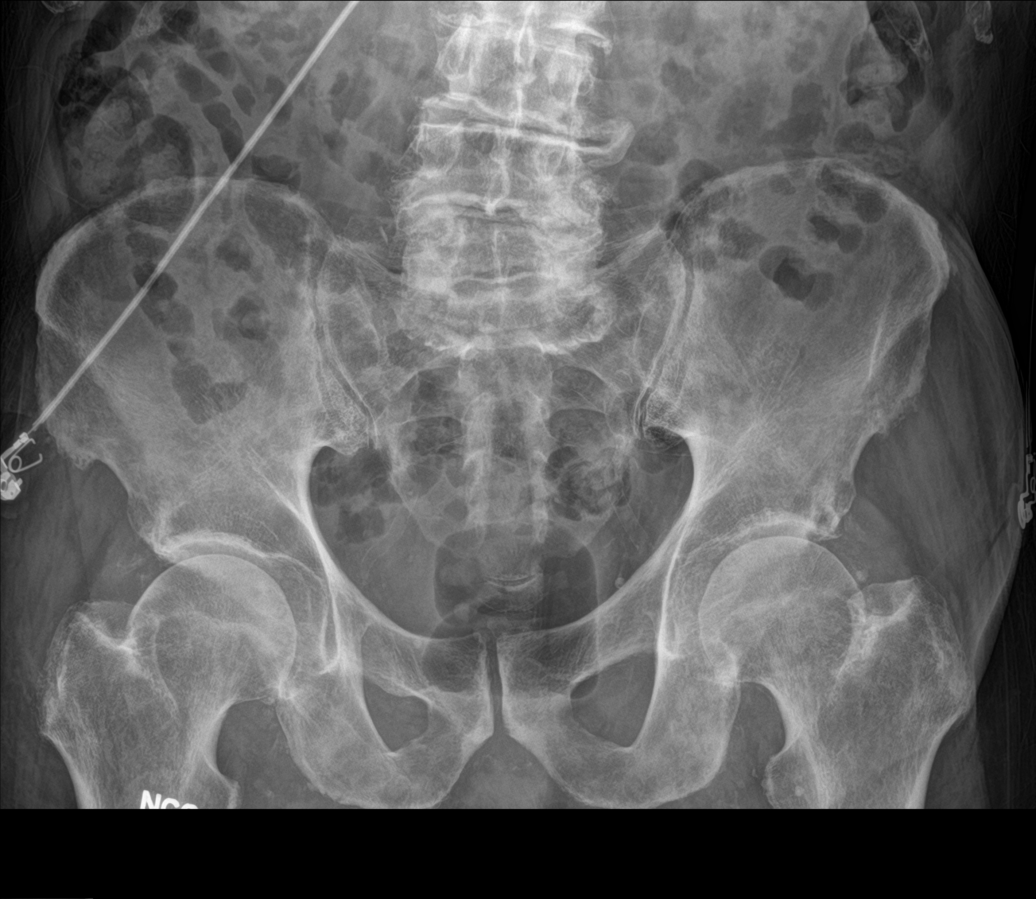

[5 of 5 positions shown; findings below may reference images not displayed]

FINDINGS: There is no evidence of hip fracture or dislocation. There is no
evidence of arthropathy or other focal bone abnormality.
IMPRESSION: Negative.

## 2019-07-24 IMAGING — DX DG SHOULDER 2+V*R*
3 series · 3 of 3 positions shown · non-contrast
Comparison: None.

CLINICAL DATA: Pain after fall.

EXAM:
RIGHT SHOULDER - 2+ VIEW

[shoulder grashey]
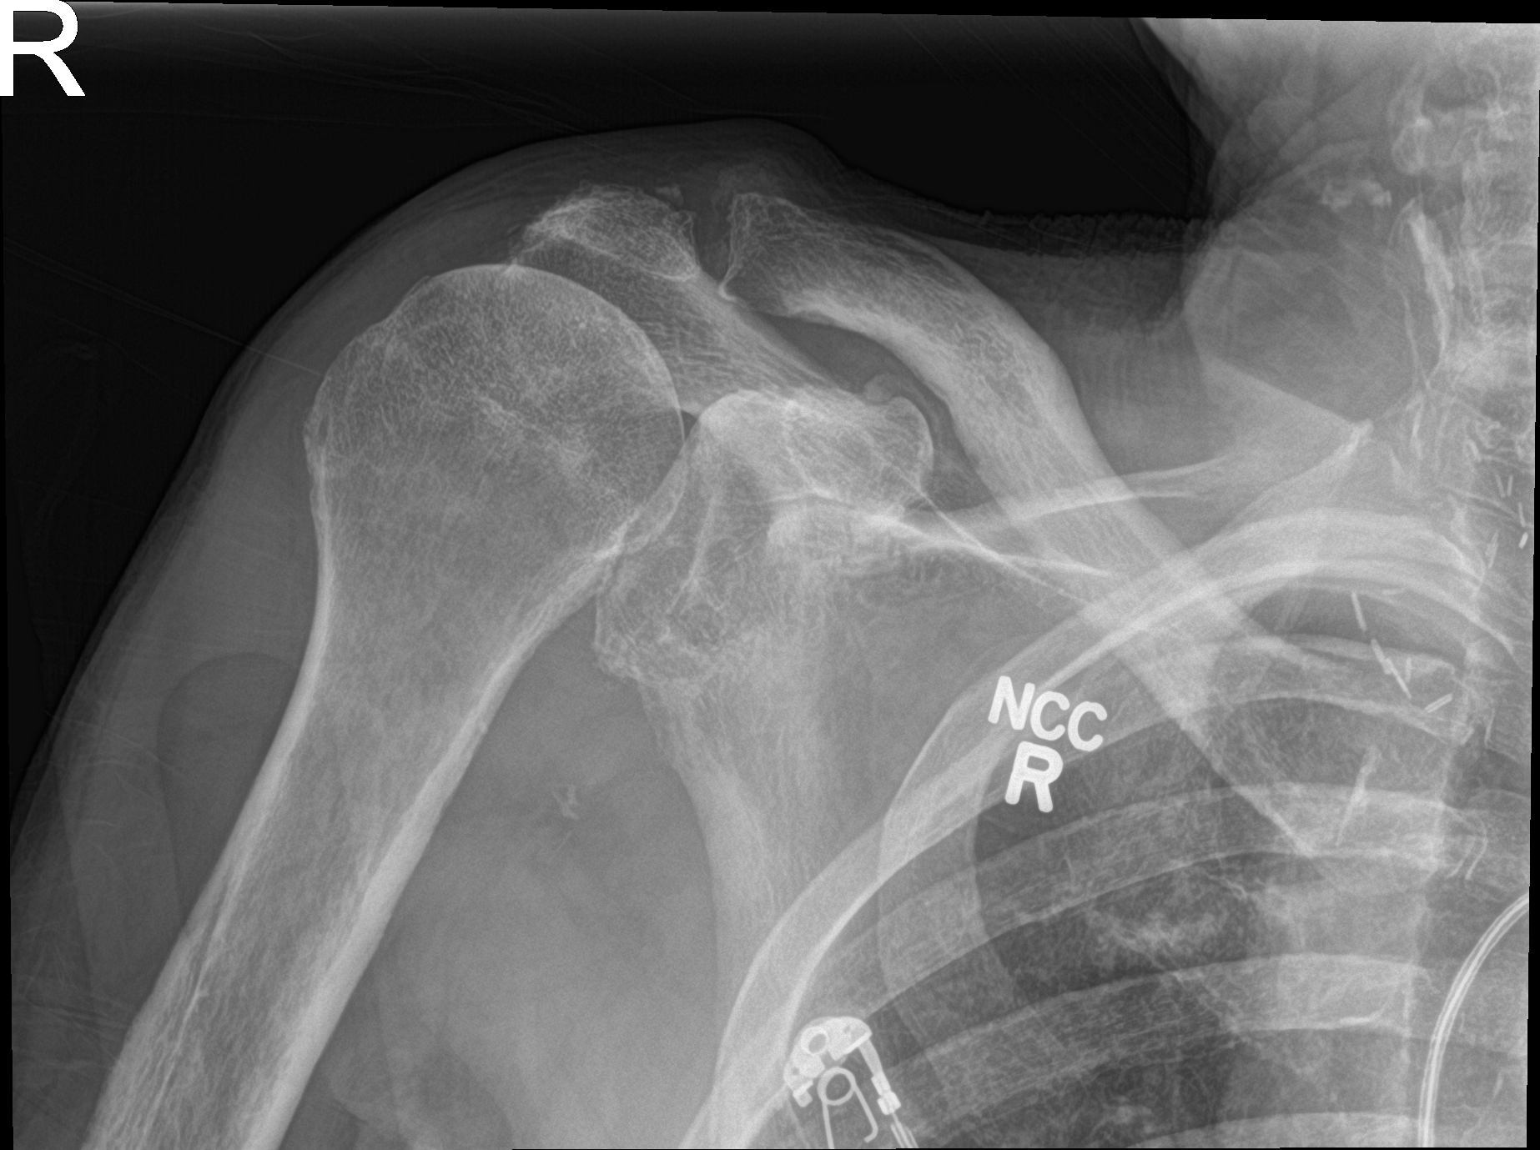

[shoulder ap neutral]
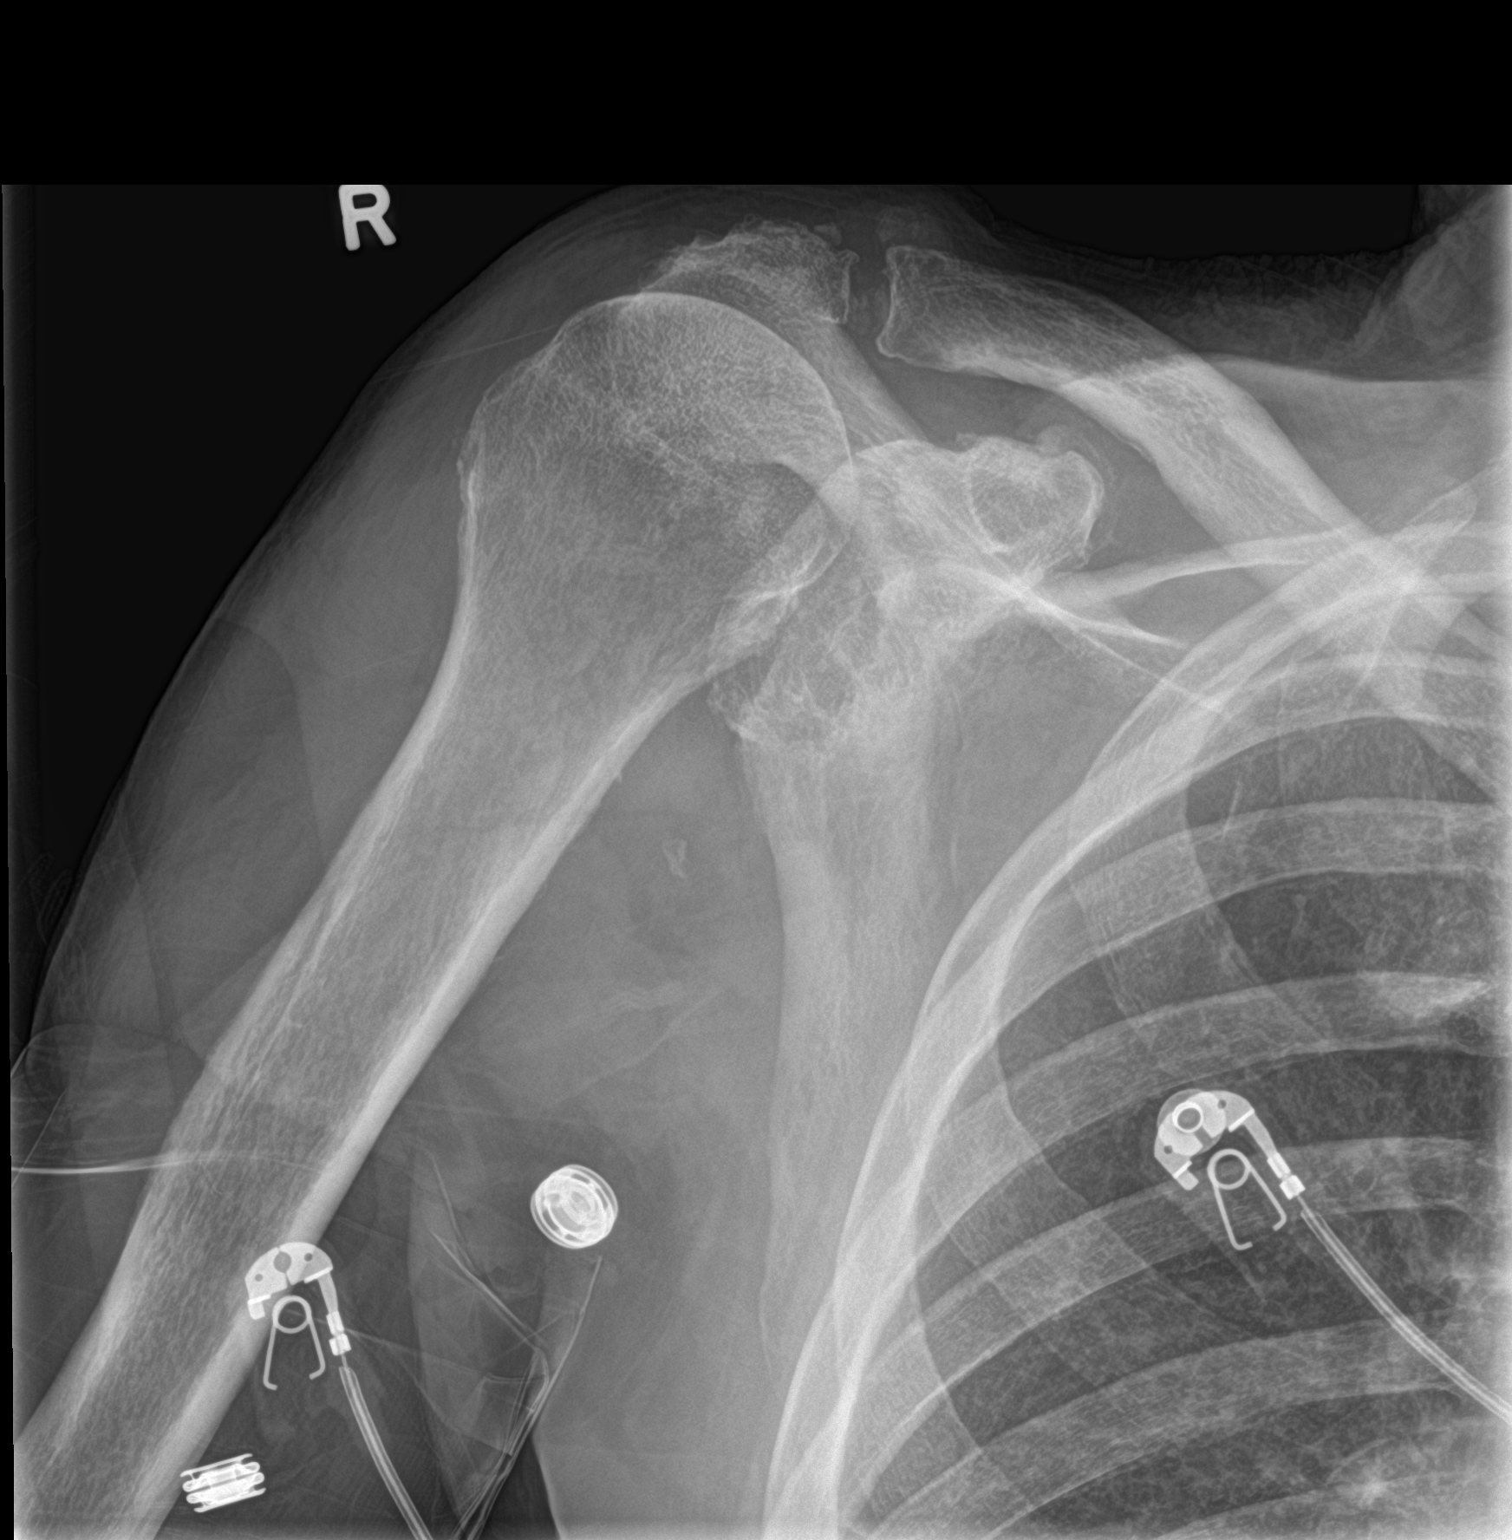

[shoulder y view]
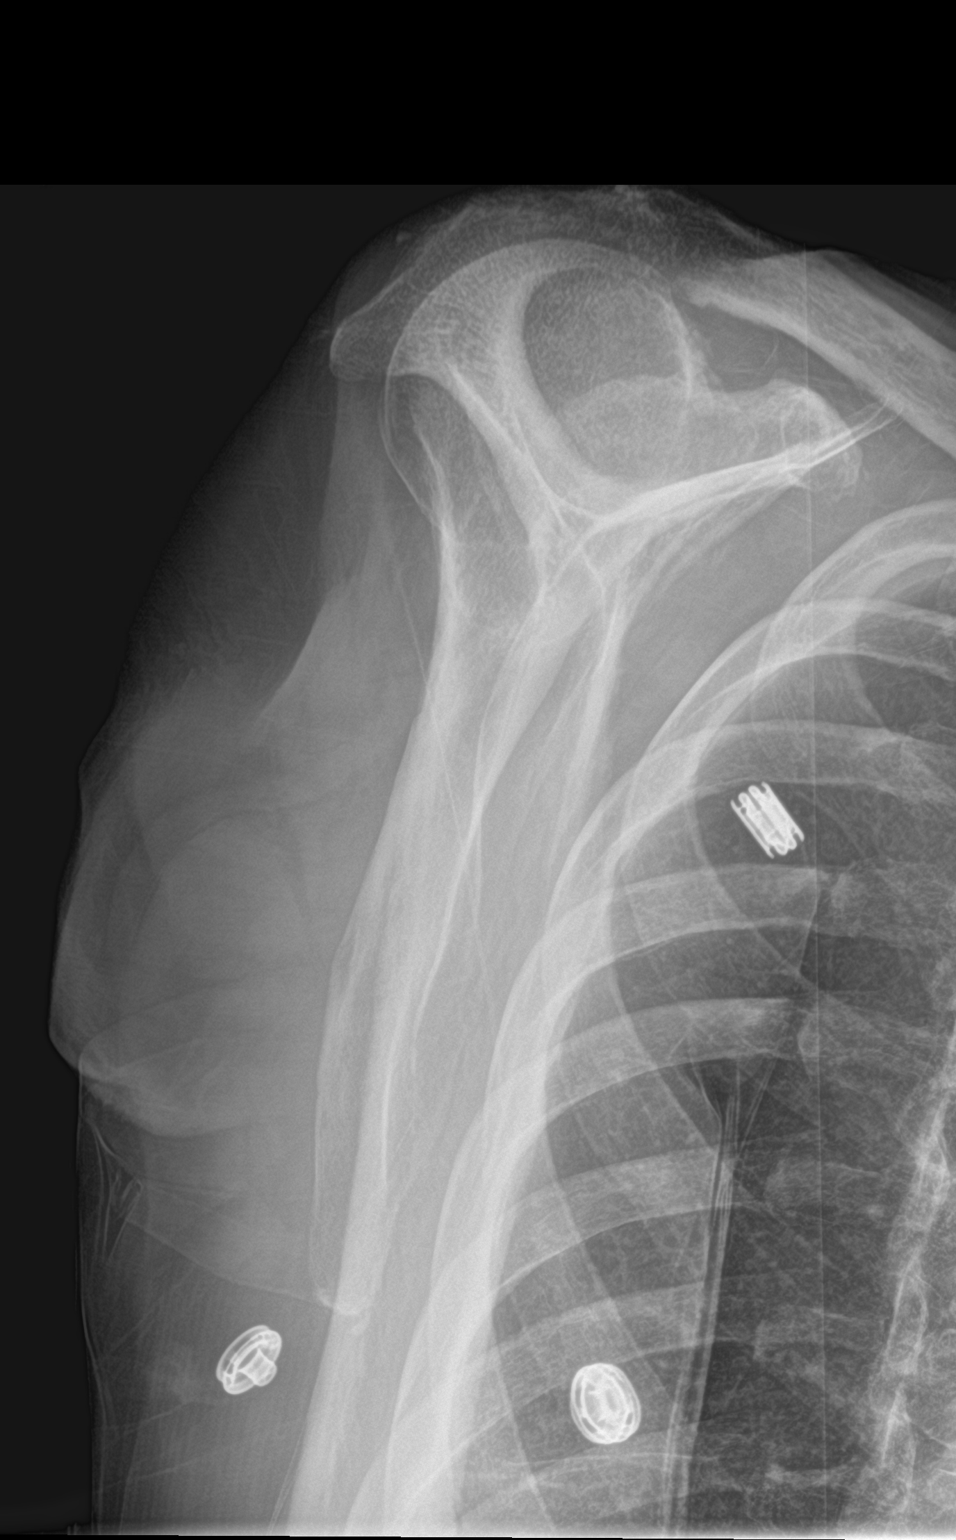

[3 of 3 positions shown; findings below may reference images not displayed]

FINDINGS: There is no evidence of fracture or dislocation. There is no
evidence of arthropathy or other focal bone abnormality. Soft
tissues are unremarkable.
IMPRESSION: Negative.

## 2020-10-15 ENCOUNTER — Other Ambulatory Visit: Payer: Self-pay

## 2020-10-15 ENCOUNTER — Emergency Department (HOSPITAL_COMMUNITY): Payer: Medicare Other

## 2020-10-15 ENCOUNTER — Inpatient Hospital Stay (HOSPITAL_COMMUNITY)
Admission: EM | Admit: 2020-10-15 | Discharge: 2020-10-24 | DRG: 871 | Disposition: A | Discharge status: Skilled Nursing Facility | Payer: Medicare Other | Attending: Internal Medicine | Admitting: Internal Medicine

## 2020-10-15 DIAGNOSIS — S12110A Anterior displaced Type II dens fracture, initial encounter for closed fracture: Secondary | ICD-10-CM | POA: Diagnosis present

## 2020-10-15 DIAGNOSIS — Z79890 Hormone replacement therapy (postmenopausal): Secondary | ICD-10-CM

## 2020-10-15 DIAGNOSIS — L03116 Cellulitis of left lower limb: Secondary | ICD-10-CM | POA: Diagnosis present

## 2020-10-15 DIAGNOSIS — G9341 Metabolic encephalopathy: Secondary | ICD-10-CM | POA: Diagnosis present

## 2020-10-15 DIAGNOSIS — E876 Hypokalemia: Secondary | ICD-10-CM | POA: Diagnosis present

## 2020-10-15 DIAGNOSIS — Z7902 Long term (current) use of antithrombotics/antiplatelets: Secondary | ICD-10-CM

## 2020-10-15 DIAGNOSIS — E785 Hyperlipidemia, unspecified: Secondary | ICD-10-CM | POA: Diagnosis present

## 2020-10-15 DIAGNOSIS — W19XXXA Unspecified fall, initial encounter: Secondary | ICD-10-CM | POA: Diagnosis present

## 2020-10-15 DIAGNOSIS — Z20822 Contact with and (suspected) exposure to covid-19: Secondary | ICD-10-CM | POA: Diagnosis present

## 2020-10-15 DIAGNOSIS — I4891 Unspecified atrial fibrillation: Secondary | ICD-10-CM | POA: Diagnosis present

## 2020-10-15 DIAGNOSIS — S81812A Laceration without foreign body, left lower leg, initial encounter: Secondary | ICD-10-CM | POA: Diagnosis present

## 2020-10-15 DIAGNOSIS — F0390 Unspecified dementia without behavioral disturbance: Secondary | ICD-10-CM | POA: Diagnosis present

## 2020-10-15 DIAGNOSIS — Z7401 Bed confinement status: Secondary | ICD-10-CM

## 2020-10-15 DIAGNOSIS — A419 Sepsis, unspecified organism: Secondary | ICD-10-CM | POA: Diagnosis present

## 2020-10-15 DIAGNOSIS — F1010 Alcohol abuse, uncomplicated: Secondary | ICD-10-CM | POA: Diagnosis present

## 2020-10-15 DIAGNOSIS — R7989 Other specified abnormal findings of blood chemistry: Secondary | ICD-10-CM | POA: Diagnosis present

## 2020-10-15 DIAGNOSIS — R609 Edema, unspecified: Secondary | ICD-10-CM | POA: Diagnosis not present

## 2020-10-15 DIAGNOSIS — Z79899 Other long term (current) drug therapy: Secondary | ICD-10-CM

## 2020-10-15 DIAGNOSIS — I4510 Unspecified right bundle-branch block: Secondary | ICD-10-CM | POA: Diagnosis present

## 2020-10-15 DIAGNOSIS — Z8719 Personal history of other diseases of the digestive system: Secondary | ICD-10-CM

## 2020-10-15 DIAGNOSIS — S12000A Unspecified displaced fracture of first cervical vertebra, initial encounter for closed fracture: Secondary | ICD-10-CM | POA: Diagnosis present

## 2020-10-15 DIAGNOSIS — Y92019 Unspecified place in single-family (private) house as the place of occurrence of the external cause: Secondary | ICD-10-CM | POA: Diagnosis not present

## 2020-10-15 DIAGNOSIS — Z66 Do not resuscitate: Secondary | ICD-10-CM | POA: Diagnosis not present

## 2020-10-15 DIAGNOSIS — Z7189 Other specified counseling: Secondary | ICD-10-CM | POA: Diagnosis not present

## 2020-10-15 DIAGNOSIS — Z8673 Personal history of transient ischemic attack (TIA), and cerebral infarction without residual deficits: Secondary | ICD-10-CM

## 2020-10-15 DIAGNOSIS — E872 Acidosis: Secondary | ICD-10-CM | POA: Diagnosis present

## 2020-10-15 DIAGNOSIS — I10 Essential (primary) hypertension: Secondary | ICD-10-CM | POA: Diagnosis present

## 2020-10-15 DIAGNOSIS — R8281 Pyuria: Secondary | ICD-10-CM | POA: Diagnosis present

## 2020-10-15 DIAGNOSIS — R32 Unspecified urinary incontinence: Secondary | ICD-10-CM | POA: Diagnosis present

## 2020-10-15 DIAGNOSIS — M532X2 Spinal instabilities, cervical region: Secondary | ICD-10-CM | POA: Diagnosis present

## 2020-10-15 DIAGNOSIS — E8809 Other disorders of plasma-protein metabolism, not elsewhere classified: Secondary | ICD-10-CM | POA: Diagnosis present

## 2020-10-15 DIAGNOSIS — R1312 Dysphagia, oropharyngeal phase: Secondary | ICD-10-CM | POA: Diagnosis present

## 2020-10-15 DIAGNOSIS — R0902 Hypoxemia: Secondary | ICD-10-CM

## 2020-10-15 DIAGNOSIS — G4710 Hypersomnia, unspecified: Secondary | ICD-10-CM | POA: Diagnosis present

## 2020-10-15 DIAGNOSIS — Z515 Encounter for palliative care: Secondary | ICD-10-CM

## 2020-10-15 DIAGNOSIS — M21371 Foot drop, right foot: Secondary | ICD-10-CM | POA: Diagnosis present

## 2020-10-15 DIAGNOSIS — H9190 Unspecified hearing loss, unspecified ear: Secondary | ICD-10-CM | POA: Diagnosis present

## 2020-10-15 DIAGNOSIS — D538 Other specified nutritional anemias: Secondary | ICD-10-CM | POA: Diagnosis present

## 2020-10-15 DIAGNOSIS — J9811 Atelectasis: Secondary | ICD-10-CM | POA: Diagnosis not present

## 2020-10-15 DIAGNOSIS — R296 Repeated falls: Secondary | ICD-10-CM | POA: Diagnosis present

## 2020-10-15 DIAGNOSIS — R6520 Severe sepsis without septic shock: Secondary | ICD-10-CM | POA: Diagnosis present

## 2020-10-15 DIAGNOSIS — E039 Hypothyroidism, unspecified: Secondary | ICD-10-CM | POA: Diagnosis present

## 2020-10-15 DIAGNOSIS — E8770 Fluid overload, unspecified: Secondary | ICD-10-CM | POA: Diagnosis present

## 2020-10-15 DIAGNOSIS — J189 Pneumonia, unspecified organism: Secondary | ICD-10-CM

## 2020-10-15 DIAGNOSIS — R159 Full incontinence of feces: Secondary | ICD-10-CM | POA: Diagnosis present

## 2020-10-15 DIAGNOSIS — I712 Thoracic aortic aneurysm, without rupture: Secondary | ICD-10-CM | POA: Diagnosis present

## 2020-10-15 DIAGNOSIS — Z808 Family history of malignant neoplasm of other organs or systems: Secondary | ICD-10-CM

## 2020-10-15 DIAGNOSIS — S12000B Unspecified displaced fracture of first cervical vertebra, initial encounter for open fracture: Secondary | ICD-10-CM | POA: Diagnosis not present

## 2020-10-15 DIAGNOSIS — S12090A Other displaced fracture of first cervical vertebra, initial encounter for closed fracture: Secondary | ICD-10-CM | POA: Diagnosis present

## 2020-10-15 DIAGNOSIS — I69398 Other sequelae of cerebral infarction: Secondary | ICD-10-CM

## 2020-10-15 LAB — RAPID URINE DRUG SCREEN, HOSP PERFORMED
Amphetamines: NOT DETECTED
Barbiturates: NOT DETECTED
Benzodiazepines: NOT DETECTED
Cocaine: NOT DETECTED
Opiates: NOT DETECTED
Tetrahydrocannabinol: NOT DETECTED

## 2020-10-15 LAB — CBC WITH DIFFERENTIAL/PLATELET
Abs Immature Granulocytes: 0.08 10*3/uL — ABNORMAL HIGH (ref 0.00–0.07)
Basophils Absolute: 0 10*3/uL (ref 0.0–0.1)
Basophils Relative: 0 %
Eosinophils Absolute: 0.1 10*3/uL (ref 0.0–0.5)
Eosinophils Relative: 0 %
HCT: 34 % — ABNORMAL LOW (ref 39.0–52.0)
Hemoglobin: 11.3 g/dL — ABNORMAL LOW (ref 13.0–17.0)
Immature Granulocytes: 1 %
Lymphocytes Relative: 6 %
Lymphs Abs: 0.9 10*3/uL (ref 0.7–4.0)
MCH: 33.1 pg (ref 26.0–34.0)
MCHC: 33.2 g/dL (ref 30.0–36.0)
MCV: 99.7 fL (ref 80.0–100.0)
Monocytes Absolute: 1.1 10*3/uL — ABNORMAL HIGH (ref 0.1–1.0)
Monocytes Relative: 7 %
Neutro Abs: 13.4 10*3/uL — ABNORMAL HIGH (ref 1.7–7.7)
Neutrophils Relative %: 86 %
Platelets: 316 10*3/uL (ref 150–400)
RBC: 3.41 MIL/uL — ABNORMAL LOW (ref 4.22–5.81)
RDW: 14.1 % (ref 11.5–15.5)
WBC: 15.6 10*3/uL — ABNORMAL HIGH (ref 4.0–10.5)
nRBC: 0 % (ref 0.0–0.2)

## 2020-10-15 LAB — LACTIC ACID, PLASMA
Lactic Acid, Venous: 3.5 mmol/L (ref 0.5–1.9)
Lactic Acid, Venous: 1.4 mmol/L (ref 0.5–1.9)

## 2020-10-15 LAB — COMPREHENSIVE METABOLIC PANEL
ALT: 26 U/L (ref 0–44)
AST: 52 U/L — ABNORMAL HIGH (ref 15–41)
Albumin: 2.4 g/dL — ABNORMAL LOW (ref 3.5–5.0)
Alkaline Phosphatase: 96 U/L (ref 38–126)
Anion gap: 10 (ref 5–15)
BUN: 23 mg/dL (ref 8–23)
CO2: 22 mmol/L (ref 22–32)
Calcium: 9.6 mg/dL (ref 8.9–10.3)
Chloride: 106 mmol/L (ref 98–111)
Creatinine, Ser: 0.88 mg/dL (ref 0.61–1.24)
GFR, Estimated: >60 mL/min (ref >60)
Glucose, Bld: 88 mg/dL (ref 70–99)
Potassium: 3.4 mmol/L — ABNORMAL LOW (ref 3.5–5.1)
Sodium: 138 mmol/L (ref 135–145)
Total Bilirubin: 1.5 mg/dL — ABNORMAL HIGH (ref 0.3–1.2)
Total Protein: 5.2 g/dL — ABNORMAL LOW (ref 6.5–8.1)

## 2020-10-15 LAB — I-STAT CHEM 8, ED
BUN: 20 mg/dL (ref 8–23)
Calcium, Ion: 1.41 mmol/L — ABNORMAL HIGH (ref 1.15–1.40)
Chloride: 106 mmol/L (ref 98–111)
Creatinine, Ser: 0.7 mg/dL (ref 0.61–1.24)
Glucose, Bld: 82 mg/dL (ref 70–99)
HCT: 36 % — ABNORMAL LOW (ref 39.0–52.0)
Hemoglobin: 12.2 g/dL — ABNORMAL LOW (ref 13.0–17.0)
Potassium: 3.3 mmol/L — ABNORMAL LOW (ref 3.5–5.1)
Sodium: 140 mmol/L (ref 135–145)
TCO2: 21 mmol/L — ABNORMAL LOW (ref 22–32)

## 2020-10-15 LAB — BLOOD GAS, VENOUS
Acid-base deficit: 2 mmol/L (ref 0.0–2.0)
Bicarbonate: 22.5 mmol/L (ref 20.0–28.0)
O2 Saturation: 59 %
Patient temperature: 98.6
pCO2, Ven: 39.7 mmHg — ABNORMAL LOW (ref 44.0–60.0)
pH, Ven: 7.372 (ref 7.250–7.430)
pO2, Ven: 35.7 mmHg (ref 32.0–45.0)

## 2020-10-15 LAB — URINALYSIS, ROUTINE W REFLEX MICROSCOPIC
Bilirubin Urine: NEGATIVE
Glucose, UA: NEGATIVE mg/dL
Hgb urine dipstick: NEGATIVE
Ketones, ur: NEGATIVE mg/dL
Nitrite: NEGATIVE
Protein, ur: NEGATIVE mg/dL
Specific Gravity, Urine: 1.019 (ref 1.005–1.030)
pH: 5 (ref 5.0–8.0)

## 2020-10-15 LAB — BRAIN NATRIURETIC PEPTIDE: B Natriuretic Peptide: 305.1 pg/mL — ABNORMAL HIGH (ref 0.0–100.0)

## 2020-10-15 LAB — TROPONIN I (HIGH SENSITIVITY)
Troponin I (High Sensitivity): 62 ng/L — ABNORMAL HIGH (ref <18)
Troponin I (High Sensitivity): 58 ng/L — ABNORMAL HIGH (ref <18)

## 2020-10-15 LAB — CK: Total CK: 363 U/L (ref 49–397)

## 2020-10-15 LAB — TSH: TSH: 6.308 u[IU]/mL — ABNORMAL HIGH (ref 0.350–4.500)

## 2020-10-15 LAB — PROTIME-INR
INR: 1.1 (ref 0.8–1.2)
Prothrombin Time: 13.6 seconds (ref 11.4–15.2)

## 2020-10-15 LAB — ETHANOL: Alcohol, Ethyl (B): <10 mg/dL (ref <10)

## 2020-10-15 LAB — MAGNESIUM: Magnesium: 1.5 mg/dL — ABNORMAL LOW (ref 1.7–2.4)

## 2020-10-15 MED ORDER — LORAZEPAM 2 MG/ML IJ SOLN
1.0000 mg | Freq: Once | INTRAMUSCULAR | Status: AC
  Administered 2020-10-15: 1 mg via INTRAVENOUS
  Filled 2020-10-15: qty 1

## 2020-10-15 MED ORDER — ACETAMINOPHEN 650 MG RE SUPP: 650.0000 mg | Freq: Four times a day (QID) | RECTAL | Status: DC | PRN

## 2020-10-15 MED ORDER — MAGNESIUM SULFATE 50 % IJ SOLN: 2.0000 g | Freq: Once | INTRAMUSCULAR | Status: DC

## 2020-10-15 MED ORDER — ACETAMINOPHEN 325 MG PO TABS: 650.0000 mg | ORAL_TABLET | Freq: Four times a day (QID) | ORAL | Status: DC | PRN

## 2020-10-15 MED ORDER — MAGNESIUM SULFATE 2 GM/50ML IV SOLN
2.0000 g | Freq: Once | INTRAVENOUS | Status: AC
  Administered 2020-10-15: 2 g via INTRAVENOUS
  Filled 2020-10-15: qty 50

## 2020-10-15 MED ORDER — SODIUM CHLORIDE 0.9% FLUSH
3.0000 mL | Freq: Two times a day (BID) | INTRAVENOUS | Status: DC
  Administered 2020-10-15 – 2020-10-19 (×7): 3 mL via INTRAVENOUS

## 2020-10-15 MED ORDER — ONDANSETRON HCL 4 MG PO TABS: 4.0000 mg | ORAL_TABLET | Freq: Four times a day (QID) | ORAL | Status: DC | PRN

## 2020-10-15 MED ORDER — SODIUM CHLORIDE 0.9 % IV BOLUS (SEPSIS)
500.0000 mL | Freq: Once | INTRAVENOUS | Status: AC
  Administered 2020-10-15: 500 mL via INTRAVENOUS

## 2020-10-15 MED ORDER — CEFAZOLIN SODIUM-DEXTROSE 1-4 GM/50ML-% IV SOLN
1.0000 g | Freq: Three times a day (TID) | INTRAVENOUS | Status: DC
  Administered 2020-10-15 – 2020-10-18 (×8): 1 g via INTRAVENOUS
  Filled 2020-10-15 (×9): qty 50

## 2020-10-15 MED ORDER — POTASSIUM CHLORIDE IN NACL 40-0.9 MEQ/L-% IV SOLN
INTRAVENOUS | Status: DC
  Filled 2020-10-15: qty 1000

## 2020-10-15 MED ORDER — VANCOMYCIN HCL IN DEXTROSE 1-5 GM/200ML-% IV SOLN
1000.0000 mg | Freq: Once | INTRAVENOUS | Status: AC
  Administered 2020-10-15: 1000 mg via INTRAVENOUS
  Filled 2020-10-15: qty 200

## 2020-10-15 MED ORDER — HALOPERIDOL LACTATE 5 MG/ML IJ SOLN
2.0000 mg | Freq: Once | INTRAMUSCULAR | Status: DC
  Filled 2020-10-15: qty 1

## 2020-10-15 MED ORDER — THIAMINE HCL 100 MG/ML IJ SOLN
100.0000 mg | Freq: Once | INTRAMUSCULAR | Status: AC
  Administered 2020-10-15: 100 mg via INTRAVENOUS
  Filled 2020-10-15: qty 2

## 2020-10-15 MED ORDER — SODIUM CHLORIDE 0.9 % IV SOLN
2.0000 g | Freq: Once | INTRAVENOUS | Status: AC
  Administered 2020-10-15: 2 g via INTRAVENOUS
  Filled 2020-10-15: qty 2

## 2020-10-15 MED ORDER — ONDANSETRON HCL 4 MG/2ML IJ SOLN: 4.0000 mg | Freq: Four times a day (QID) | INTRAMUSCULAR | Status: DC | PRN

## 2020-10-15 NOTE — H&P (Signed)
History and Physical    [name]:[id number] DOB: August 12, 1926 DOA: 10/15/2020  PCP: Bernerd Limbo, MD  Patient coming from: Home via EMS  I have personally briefly reviewed patient's old medical records in [facility]  Chief Complaint: Confusion  HPI: Ian [name] is a 87 y.o. male with medical history significant for history of CVA on Plavix, hypertension, hyperlipidemia, hypothyroidism, RBBB, alcohol use disorder, dementia who was brought to the ED via EMS for evaluation of confusion.  Patient is unable to provide any history due to encephalopathy and majority history is otherwise obtained from EDP, chart review, and wife by phone.  Per wife, patient had a stroke in 2019 which has resulted in chronic right foot drop.  He uses a walker to ambulate but has had several falls recently.  He had a fall 2 to 3 days ago resulting in injury to his left shin.  Today he was found down at home by his wife presumed due to an unwitnessed fall.  He had apparently stayed on the floor overnight before he was found.  Wife was unable to get him up and he was noted to be more confused than baseline therefore she called EMS for further assistance.  Per ED documentation, EMS reported that patient's room was significantly disheveled/unkempt with feces and body fluids present.  Rest of the house including wife's separate room was clean and tidy.  Per patient's wife, he has been taking his medications regularly.  He does drink alcohol about 1 beer and 1 cocktail per day but denies further heavy alcohol use.  She says he has not had a drink in the last 2-3 days due to feeling unwell.  ED Course:  Initial vitals showed BP 122/85, pulse 68, RR 16, temp 94.9 degrees rectally, SPO2 100% on room air.  While in the ED patient was tachycardic with pulse up to 106 and tachypneic with RR up to 22.  Labs significant for WBC 15.6, hemoglobin 11.3, platelets 316,000, sodium 138, potassium 3.4, magnesium 1.5, bicarb 22,  BUN 23, creatinine 0.88, serum glucose 88, AST 52, ALT 26, alk phos 96, total bilirubin 1.5, lactic acid 3.5, high-sensitivity troponin I 62, CK 363.  VBG showed pH 7.372, PCO2 39.7, PO2 35.7.  UA and urine culture collected and pending.  Portable chest x-ray shows chronic airspace opacities at the lung bases, no focal consolidation, edema, or effusion.  No definite acute osseous abnormality.  Dilated thoracic aorta noted.  Pelvic x-ray is negative for acute displaced fracture or dislocation.  CT head and cervical spine without contrast notable for fracture through the odontoid process as well as fracture through the anterior arch of C1.  C1 somewhat posteriorly displaced.  Canal patent.  No acute intracranial abnormalities noted.  Patient was given IV Ativan 1 mg, IV vancomycin and cefepime, IV magnesium 2 g, and thiamine.  EDP discussed with on-call neurosurgery who has reviewed the images and recommends rigid cervical collar and will formally consult.  The hospitalist service was consulted to admit for further evaluation and management.  Review of Systems:  Unable to obtain full review of systems due to altered mental status/dementia.   Past Medical History:  Diagnosis Date  . 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 Calculus of kidney 03/31/2013   Overview:  IMPRESSION: see ER note   . Hypertension   . Leukocytosis 10/26/2017  . Loss of weight 10/26/2017  . Multiple leg contusions, unspecified laterality, sequela 10/26/2017  . Scalp hematoma, subsequent encounter 10/26/2017  .  Stroke [facility])     Past Surgical History:  Procedure Laterality Date  . ESOPHAGOGASTRODUODENOSCOPY (EGD) WITH PROPOFOL N/A 11/09/2017   Procedure: ESOPHAGOGASTRODUODENOSCOPY (EGD) WITH PROPOFOL;  Surgeon: Laurence Spates, MD;  Location: [place];  Service: Endoscopy;  Laterality: N/A;    Social History:  reports that he has never smoked. He has never used smokeless tobacco. He reports current alcohol use  of about 6.0 - 12.0 standard drinks of alcohol per week. He reports that he does not use drugs.  No Known Allergies  Family History  Problem Relation Age of Onset  . Brain cancer Mother   . Cancer Sister      Prior to Admission medications   Medication Sig Start Date End Date Taking? Authorizing Provider  atorvastatin (LIPITOR) 40 MG tablet Take 1 tablet (40 mg total) by mouth daily at 6 PM. Patient taking differently: Take 40 mg by mouth every morning. 10/29/17  Yes Abraham, Sherin, DO  B Complex-C-E-Zn (STRESS B-COMPLEX/VIT C/ZINC) TABS Take 1 tablet by mouth every morning.   Yes [provider]  clopidogrel (PLAVIX) 75 MG tablet Take 1 tablet (75 mg total) by mouth daily. Start 10/30/17 Patient taking differently: Take 75 mg by mouth daily at 4 PM. Start 10/30/17 10/30/17  Yes Tammi Klippel, Sherin, DO  furosemide (LASIX) 20 MG tablet Take 20 mg by mouth daily at 4 PM. 09/18/20  Yes [provider]  levothyroxine (SYNTHROID, LEVOTHROID) 50 MCG tablet Take 1 tablet (50 mcg total) by mouth daily before breakfast. Patient taking differently: Take 50 mcg by mouth every morning. 11/11/17  Yes Bland, Scott, DO  pantoprazole (PROTONIX) 40 MG tablet Take 40 mg by mouth every morning.   Yes [provider]  potassium chloride SA (KLOR-CON) 20 MEQ tablet Take 20 mEq by mouth daily at 4 PM.   Yes [provider]  vitamin B-12 (CYANOCOBALAMIN) 1000 MCG tablet Take 1,000 mcg by mouth daily at 4 PM.   Yes [provider]  nitroGLYCERIN (NITROSTAT) 0.4 MG SL tablet Place 1 tablet (0.4 mg total) under the tongue every 5 (five) minutes x 3 doses as needed for chest pain. Patient not taking: No sig reported 10/29/17   Caroline More, DO  Nutritional Supplements (NUTRITIONAL SUPPLEMENT PO) NAS (no salt added) diet -  Mechanical soft texture, thin liquids consistency 11/12/17   [provider]    Physical Exam: Vitals:   10/15/20 1815 10/15/20 1900 10/15/20 1918  10/15/20 1930  BP: (!) 85/56 99/65  117/60  Pulse: 97 96  95  Resp: (!) 30 (!) 21  19  Temp:   (!) 95.2 F (35.1 C)   TempSrc:   Rectal   SpO2: 95% 98%  95%  Weight:      Height:      Exam limited due to mental status. Constitutional: Chronically ill-appearing elderly man resting supine in bed, somnolent and snoring. Eyes: PERRL, lids and conjunctivae normal ENMT: Mucous membranes are dry. Posterior pharynx clear of any exudate or lesions.poor dentition.  Neck: normal, supple, no masses. Respiratory: clear to auscultation anteriorly.  Normal respiratory effort. No accessory muscle use.  Cardiovascular: Irregularly irregular, no murmurs / rubs / gallops.  3+ pitting edema bilateral lower extremities.  Difficult to palpate pedal pulses due to edema. Abdomen: no tenderness, no masses palpated.  Musculoskeletal: no clubbing / cyanosis. No joint deformity upper and lower extremities.  Skin: Skin tear anterior left lower extremity as pictured below with surrounding erythema. Neurologic: Exam limited due to somnolence, withdraws  extremities to stimuli Psychiatric: Hypersomnolent    Labs on Admission: I have personally reviewed following labs and imaging studies  CBC: Recent Labs  Lab 10/15/20 1415 10/15/20 1430  WBC 15.6*  --   NEUTROABS 13.4*  --   HGB 11.3* 12.2*  HCT 34.0* 36.0*  MCV 99.7  --   PLT 316  --    Basic Metabolic Panel: Recent Labs  Lab 10/15/20 1415 10/15/20 1430  NA 138 140  K 3.4* 3.3*  CL 106 106  CO2 22  --   GLUCOSE 88 82  BUN 23 20  CREATININE 0.88 0.70  CALCIUM 9.6  --   MG 1.5*  --    GFR: Estimated Creatinine Clearance: 62.8 mL/min (by C-G formula based on SCr of 0.7 mg/dL). Liver Function Tests: Recent Labs  Lab 10/15/20 1415  AST 52*  ALT 26  ALKPHOS 96  BILITOT 1.5*  PROT 5.2*  ALBUMIN 2.4*   No results for input(s): LIPASE, AMYLASE in the last 168 hours. No results for input(s): AMMONIA in the last 168 hours. Coagulation  Profile: Recent Labs  Lab 10/15/20 1415  INR 1.1   Cardiac Enzymes: Recent Labs  Lab 10/15/20 1415  CKTOTAL 363   BNP (last 3 results) No results for input(s): PROBNP in the last 8760 hours. HbA1C: No results for input(s): HGBA1C in the last 72 hours. CBG: No results for input(s): GLUCAP in the last 168 hours. Lipid Profile: No results for input(s): CHOL, HDL, LDLCALC, TRIG, CHOLHDL, LDLDIRECT in the last 72 hours. Thyroid Function Tests: No results for input(s): TSH, T4TOTAL, FREET4, T3FREE, THYROIDAB in the last 72 hours. Anemia Panel: No results for input(s): VITAMINB12, FOLATE, FERRITIN, TIBC, IRON, RETICCTPCT in the last 72 hours. Urine analysis:    Component Value Date/Time   COLORURINE YELLOW 11/09/2017 1051   APPEARANCEUR CLEAR 11/09/2017 1051   LABSPEC 1.019 11/09/2017 1051   PHURINE 5.0 11/09/2017 1051   GLUCOSEU NEGATIVE 11/09/2017 1051   HGBUR SMALL (A) 11/09/2017 1051   BILIRUBINUR NEGATIVE 11/09/2017 1051   KETONESUR NEGATIVE 11/09/2017 1051   PROTEINUR NEGATIVE 11/09/2017 1051   UROBILINOGEN 1.0 01/25/2010 1355   NITRITE NEGATIVE 11/09/2017 1051   LEUKOCYTESUR NEGATIVE 11/09/2017 1051    Radiological Exams on Admission: DG Chest 1 View  Result Date: 10/15/2020 CLINICAL DATA:  Pain status post fall EXAM: CHEST  1 VIEW COMPARISON:  October 25, 2017 FINDINGS: The heart size is relatively stable. The thoracic aorta appears to be dilated. There are chronic airspace opacities at the lung bases. These are favored to represent atelectasis or scarring. There is no pneumothorax. No definite acute osseous abnormality. IMPRESSION: 1. No definite acute osseous abnormality. 2. Bibasilar atelectasis or scarring. 3. Dilated thoracic aorta. Electronically Signed   By: Constance Holster M.D.   On: 10/15/2020 15:06   DG Pelvis 1-2 Views  Result Date: 10/15/2020 CLINICAL DATA:  Acute pain due to fall. EXAM: PELVIS - 1-2 VIEW COMPARISON:  None. FINDINGS: There is diffuse  osteopenia which limits detection of nondisplaced fractures. There is no acute displaced fracture or dislocation. There are moderate degenerative changes of both hips. There are degenerative changes of the lower lumbar spine. IMPRESSION: 1. No acute displaced fracture or dislocation. Diffuse osteopenia limits detection of nondisplaced fractures. 2. Moderate degenerative changes of both hips. Electronically Signed   By: Constance Holster M.D.   On: 10/15/2020 15:07   CT Head Wo Contrast  Result Date: 10/15/2020 CLINICAL DATA:  Pain after trauma EXAM: CT HEAD WITHOUT  CONTRAST CT CERVICAL SPINE WITHOUT CONTRAST TECHNIQUE: Multidetector CT imaging of the head and cervical spine was performed following the standard protocol without intravenous contrast. Multiplanar CT image reconstructions of the cervical spine were also generated. COMPARISON:  CT scan of the head October 29, 2017. CT angio neck Nov 23, 2017. FINDINGS: CT HEAD FINDINGS Brain: No subdural, epidural, or subarachnoid hemorrhage. No mass effect or midline shift. Ventricles and sulci are stable. Cerebellum, brainstem, and basal cisterns are normal. White matter changes are identified. No acute cortical ischemia or infarct identified. Vascular: Calcified atherosclerosis in the intracranial carotids. Skull: Normal. Negative for fracture or focal lesion. Sinuses/Orbits: The left maxillary and sphenoid sinuses are completely opacified, unchanged since March of 2019. Opacification of scattered left mastoid air cells is identified without associated fracture or rosea in. No other abnormalities. Other: None. CT CERVICAL SPINE FINDINGS Alignment: C1 articulates with the occipital condyles. C1 is somewhat displaced posteriorly relative to the remainder of the cervical spine. This is more pronounced compared to the 11/23/2017 study. Anterolisthesis of C3 versus C4 is stable since 2019. Several other mild listheses are identified, also likely degenerative in  nature. Anterolisthesis of C7 versus T1 is stable. Skull base and vertebrae: There are fractures through the right and left aspects of the C1 anterior arch. There is a fracture through the odontoid process best seen on sagittal image 25. No other fractures are identified. Soft tissues and spinal canal: Calcified pannus associated with the C1 level. This was also present in March of 2019. No other soft tissue abnormalities are identified. Disc levels: Multilevel degenerative changes. Facet degenerative changes. Upper chest: Negative. Other: No other abnormalities. IMPRESSION: 1. There is a fracture through the odontoid process as well as fracture through the anterior arch of C1. C1 is somewhat posteriorly displaced relative to the remainder of the cervical spine. This was at least partially present in March of 2019 but does appear somewhat increased. The canal remains patent. 2. Degenerative changes in the cervical spine. 3. No acute intracranial abnormalities. 4. Sinus disease as above. Findings called to Dr. Beecher Mcardle. Electronically Signed   By: Dorise Bullion III M.D   On: 10/15/2020 16:41   CT Cervical Spine Wo Contrast  Result Date: 10/15/2020 CLINICAL DATA:  Pain after trauma EXAM: CT HEAD WITHOUT CONTRAST CT CERVICAL SPINE WITHOUT CONTRAST TECHNIQUE: Multidetector CT imaging of the head and cervical spine was performed following the standard protocol without intravenous contrast. Multiplanar CT image reconstructions of the cervical spine were also generated. COMPARISON:  CT scan of the head October 29, 2017. CT angio neck 11/23/17. FINDINGS: CT HEAD FINDINGS Brain: No subdural, epidural, or subarachnoid hemorrhage. No mass effect or midline shift. Ventricles and sulci are stable. Cerebellum, brainstem, and basal cisterns are normal. White matter changes are identified. No acute cortical ischemia or infarct identified. Vascular: Calcified atherosclerosis in the intracranial carotids. Skull: Normal.  Negative for fracture or focal lesion. Sinuses/Orbits: The left maxillary and sphenoid sinuses are completely opacified, unchanged since March of 2019. Opacification of scattered left mastoid air cells is identified without associated fracture or rosea in. No other abnormalities. Other: None. CT CERVICAL SPINE FINDINGS Alignment: C1 articulates with the occipital condyles. C1 is somewhat displaced posteriorly relative to the remainder of the cervical spine. This is more pronounced compared to the Nov 23, 2017 study. Anterolisthesis of C3 versus C4 is stable since 2019. Several other mild listheses are identified, also likely degenerative in nature. Anterolisthesis of C7 versus T1 is  stable. Skull base and vertebrae: There are fractures through the right and left aspects of the C1 anterior arch. There is a fracture through the odontoid process best seen on sagittal image 25. No other fractures are identified. Soft tissues and spinal canal: Calcified pannus associated with the C1 level. This was also present in March of 2019. No other soft tissue abnormalities are identified. Disc levels: Multilevel degenerative changes. Facet degenerative changes. Upper chest: Negative. Other: No other abnormalities. IMPRESSION: 1. There is a fracture through the odontoid process as well as fracture through the anterior arch of C1. C1 is somewhat posteriorly displaced relative to the remainder of the cervical spine. This was at least partially present in March of 2019 but does appear somewhat increased. The canal remains patent. 2. Degenerative changes in the cervical spine. 3. No acute intracranial abnormalities. 4. Sinus disease as above. Findings called to Dr. Beecher Mcardle. Electronically Signed   By: Dorise Bullion III M.D   On: 10/15/2020 16:41    EKG: Personally reviewed. Atrial fibrillation, incomplete RBBB, low voltage.  Prior EKG showed sinus rhythm with first-degree AV block and fusion  complexes.  Assessment/Plan Principal Problem:   Severe sepsis (HCC) Active Problems:   Hypothyroidism   Hypokalemia   Dementia without behavioral disturbance (HCC)   Essential hypertension   Dyslipidemia   History of stroke   ETOH abuse   Fracture of anterior arch of C1 (HCC)   Cellulitis of left lower extremity   Acute metabolic encephalopathy   Revanth Neidig is a 87 y.o. male with medical history significant for history of CVA on Plavix, hypertension, hyperlipidemia, hypothyroidism, RBBB, alcohol use disorder, dementia who is admitted with fracture to anterior arch of C1 and severe sepsis.  Severe sepsis, present on arrival: Patient presenting with hypothermia, tachypnea, leukocytosis, tachycardia, lactic acidosis and soft tissue skin infection/cellulitis related to left lower extremity injury. -Give 500 cc normal saline bolus followed by maintenance fluids overnight -UA and urine culture pending, obtain blood cultures -Start IV Ancef, adjust based on further culture data -Consult to wound care  Fracture of anterior arch of C1 and through the odontoid process: Acute on chronic issue.  Suspected fall at home.  Neurosurgery consulted, appreciate assistance.  Rigid cervical collar ordered.  Acute metabolic encephalopathy: Likely secondary to sepsis.  Continue management as above and continue to monitor.  Alcohol use: 2 drinks per day per wife with last drink 2-3 days prior to admission.  Continue CIWA checks, monitor for withdrawal.  Atrial fibrillation: New finding on admission, possibly ongoing issue.  Rate currently controlled.  Chads vas score is at least 5.  At this point, will withhold anticoagulation due to frequent falls and cervical spine injury.  Lower extremity edema: Reportedly a chronic issue, may be worsening per wife.  Possibly dependent edema but CHF or DVT also considered. -Check echocardiogram and lower extremity Dopplers -Strict I/O's and daily  weights  Hypokalemia/hypomagnesemia: Received IV magnesium while in the ED.  Add potassium supplement to maintenance fluids.  History of CVA: Resulting in right foot drop per patient's wife.  Has been on Plavix which we are holding for now.  Resume statin when able.  Hypertension: Not currently on antihypertensives.  Has been hypotensive on admission.  Hyperlipidemia: Resume atorvastatin when able.  Hypothyroidism: Resume Synthroid when more awake.  Check TSH.  Dementia/Poor living situation/inability to care for self: Baseline functional status appears poor.  Wife states that she provides majority of care for him.  EMS reported very poor living  environment isolated to his room concerning for self-neglect.  TOC consult placed.  Will need further goals of care discussions, may need to involve palliative care.  DVT prophylaxis: SCDs only for now Code Status: Full code, discussed with patient's wife Family Communication: Discussed with patient's wife by phone Disposition Plan: From home, likely will need placement on discharge Consults called: Neurosurgery Level of care: Telemetry Admission status:   Status is: Inpatient  Remains inpatient appropriate because:Hemodynamically unstable, Unsafe d/c plan and Inpatient level of care appropriate due to severity of illness   Dispo: The patient is from: Home              Anticipated d/c is to: SNF              Anticipated d/c date is: 3 days              Patient currently is not medically stable to d/c.   Difficult to place patient Yes  Zada Finders MD [facility]  If 7PM-7AM, please contact night-coverage [email or web address]  10/15/2020, 8:44 PM

## 2020-10-15 NOTE — ED Notes (Signed)
Wife - [name] [phone] (home) [phone] (cell)
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
## 2020-10-15 NOTE — ED Notes (Signed)
Pt is agitated and looking for his wife. This writer witnessed the pt removing his EKG leads and pulse ox as he stated that "he was about to walk out of here and to call his wife". Pt refuses to allow this writer to replace the EKG leads and pulse ox. RN notified.

## 2020-10-15 NOTE — ED Provider Notes (Signed)
[facility] DEPT Provider Note   CSN: [id number] Arrival date & time: 10/15/20  1250     History Chief Complaint  Patient presents with  . Fall    Fall 15 hours ago - pt denies any pain - pt has swollen feet, wound on left lower leg, bruises to arms    Ian [name] is a 87 y.o. male.  The history is provided by the patient, medical records and the EMS personnel.  Fall   Ian [name] is a 87 y.o. male who presents to the Emergency Department complaining of fall. Level V caveat due to confusion. History is provided by EMS. Ian [name] presents the emergency department for evaluation after a fall. He felt sometime last night. He lives at home with his wife but he is in a separate room. He stayed on the floor overnight in his room today when he was still unable to get up wife called EMS. Patient states that he did not fall but could not get into the bed so he stayed on the floor. He is unable to state why he could not get into bed. He denies any pain. Per EMS his wife in the home in general were very clean and tidy. The patient's room was significantly disheveled.    Past Medical History:  Diagnosis Date  . 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 Calculus of kidney 03/31/2013   Overview:  IMPRESSION: see ER note   . Hypertension   . Leukocytosis 10/26/2017  . Loss of weight 10/26/2017  . Multiple leg contusions, unspecified laterality, sequela 10/26/2017  . Scalp hematoma, subsequent encounter 10/26/2017  . Stroke [facility])     Patient Active Problem List   Diagnosis Date Noted  . Fracture of anterior arch of C1 ([place]) 10/15/2020  . Severe sepsis ([place]) 10/15/2020  . Cellulitis of left lower extremity 10/15/2020  . Acute metabolic encephalopathy 62/22/9798  . Anemia 02/12/2018  . Duodenal ulcer 02/12/2018  . History of stroke 02/12/2018  . ETOH abuse 02/12/2018  . Right foot drop 02/12/2018  . Dyslipidemia 11/16/2017  . GI bleed due to NSAIDs  11/09/2017  . Melena 11/09/2017  . Acute blood loss anemia 11/09/2017  . GI bleed 11/09/2017  . Peptic ulcer disease   . Dysphagia, post-stroke   . Essential hypertension   . Embolic cerebral infarction ([place]) 10/29/2017  . Dementia without behavioral disturbance ([place])   . Benign essential HTN   . Diastolic dysfunction   . Hypothyroidism   . Hypokalemia   . Altered mental status   . Pressure injury of skin 10/26/2017  . Loss of weight 10/26/2017  . Elevated troponin I level 10/26/2017  . Leukocytosis 10/26/2017  . Multiple leg contusions, unspecified laterality, sequela 10/26/2017  . Cognitive impairment 10/26/2017  . Scalp hematoma, subsequent encounter 10/26/2017  . Head injury 10/26/2017  . Fall at home, sequela 10/26/2017  . Right BBB/left ant fasc block 10/26/2017  . Acute confusional state 10/26/2017  . Dizziness and giddiness 10/26/2017  . Cerebral infarction [facility]), right occipital lobe 10/26/2017  . Elevated AST (SGOT) 10/26/2017  . Shoulder injury, right, subsequent encounter 10/26/2017  . Paranasal sinus disease 10/26/2017  . Maxillary sinusitis 10/26/2017  . Ethmoid sinusitis 10/26/2017  . Sphenoid sinusitis 10/26/2017  . Rhabdomyolysis 10/25/2017  . Neuropathy 03/31/2013    Past Surgical History:  Procedure Laterality Date  . ESOPHAGOGASTRODUODENOSCOPY (EGD) WITH PROPOFOL N/A 11/09/2017   Procedure: ESOPHAGOGASTRODUODENOSCOPY (EGD) WITH PROPOFOL;  Surgeon: Laurence Spates, MD;  Location:  [facility] ENDOSCOPY;  Service: Endoscopy;  Laterality: N/A;       Family History  Problem Relation Age of Onset  . Brain cancer Mother   . Cancer Sister     Social History   Tobacco Use  . Smoking status: Never Smoker  . Smokeless tobacco: Never Used  Substance Use Topics  . Alcohol use: Yes    Alcohol/week: 6.0 - 12.0 standard drinks    Types: 6 - 12 Cans of beer per week    Comment: 3-6 alcoholic drinks per day  . Drug use: No    Home Medications Prior to Admission  medications   Medication Sig Start Date End Date Taking? Authorizing Provider  atorvastatin (LIPITOR) 40 MG tablet Take 1 tablet (40 mg total) by mouth daily at 6 PM. Patient taking differently: Take 40 mg by mouth every morning. 10/29/17  Yes Abraham, Sherin, DO  B Complex-C-E-Zn (STRESS B-COMPLEX/VIT C/ZINC) TABS Take 1 tablet by mouth every morning.   Yes [provider]  clopidogrel (PLAVIX) 75 MG tablet Take 1 tablet (75 mg total) by mouth daily. Start 10/30/17 Patient taking differently: Take 75 mg by mouth daily at 4 PM. Start 10/30/17 10/30/17  Yes Tammi Klippel, Sherin, DO  furosemide (LASIX) 20 MG tablet Take 20 mg by mouth daily at 4 PM. 09/18/20  Yes [provider]  levothyroxine (SYNTHROID, LEVOTHROID) 50 MCG tablet Take 1 tablet (50 mcg total) by mouth daily before breakfast. Patient taking differently: Take 50 mcg by mouth every morning. 11/11/17  Yes Bland, Scott, DO  pantoprazole (PROTONIX) 40 MG tablet Take 40 mg by mouth every morning.   Yes [provider]  potassium chloride SA (KLOR-CON) 20 MEQ tablet Take 20 mEq by mouth daily at 4 PM.   Yes [provider]  vitamin B-12 (CYANOCOBALAMIN) 1000 MCG tablet Take 1,000 mcg by mouth daily at 4 PM.   Yes [provider]  nitroGLYCERIN (NITROSTAT) 0.4 MG SL tablet Place 1 tablet (0.4 mg total) under the tongue every 5 (five) minutes x 3 doses as needed for chest pain. Patient not taking: No sig reported 10/29/17   Caroline More, DO  Nutritional Supplements (NUTRITIONAL SUPPLEMENT PO) NAS (no salt added) diet -  Mechanical soft texture, thin liquids consistency 11/12/17   [provider]    Allergies    Patient has no known allergies.  Review of Systems   Review of Systems  All other systems reviewed and are negative.   Physical Exam Updated Vital Signs BP 117/60   Pulse 95   Temp (!) 96.2 F (35.7 C) (Rectal)   Resp 19   Ht 5\' 11"  (1.803 m)   Wt 81.6 kg   SpO2 95%   BMI 25.10  kg/m   Physical Exam Vitals and nursing note reviewed.  Constitutional:      Appearance: He is well-developed and well-nourished.     Comments: Disheveled.  HENT:     Head: Normocephalic and atraumatic.  Cardiovascular:     Rate and Rhythm: Normal rate and regular rhythm.     Heart sounds: No murmur heard.   Pulmonary:     Effort: Pulmonary effort is normal. No respiratory distress.     Breath sounds: Normal breath sounds.  Abdominal:     Palpations: Abdomen is soft.     Tenderness: There is no abdominal tenderness. There is no guarding or rebound.  Musculoskeletal:        General: No tenderness or edema.  Comments: 3 to 4+ pitting edema to bilateral lower extremities. There is a skin tear to the left anterior shin. There is no tenderness to palpation throughout all four extremities. There is pitting edema to the left elbow without focal tenderness or erythema. Range of motion is intact in the left elbow. No hip tenderness to palpation.  Skin:    General: Skin is warm and dry.  Neurological:     Mental Status: He is alert.     Comments: Oriented to person. When asked location patient states [place]. Disoriented to year and recent events. 4/5 strength in all four extremities.  Psychiatric:        Mood and Affect: Mood and affect normal.     Comments: Mildly agitated.     ED Results / Procedures / Treatments   Labs (all labs ordered are listed, but only abnormal results are displayed) Labs Reviewed  COMPREHENSIVE METABOLIC PANEL - Abnormal; Notable for the following components:      Result Value   Potassium 3.4 (*)    Total Protein 5.2 (*)    Albumin 2.4 (*)    AST 52 (*)    Total Bilirubin 1.5 (*)    All other components within normal limits  LACTIC ACID, PLASMA - Abnormal; Notable for the following components:   Lactic Acid, Venous 3.5 (*)    All other components within normal limits  CBC WITH DIFFERENTIAL/PLATELET - Abnormal; Notable for the following components:    WBC 15.6 (*)    RBC 3.41 (*)    Hemoglobin 11.3 (*)    HCT 34.0 (*)    Neutro Abs 13.4 (*)    Monocytes Absolute 1.1 (*)    Abs Immature Granulocytes 0.08 (*)    All other components within normal limits  MAGNESIUM - Abnormal; Notable for the following components:   Magnesium 1.5 (*)    All other components within normal limits  URINALYSIS, ROUTINE W REFLEX MICROSCOPIC - Abnormal; Notable for the following components:   APPearance HAZY (*)    Leukocytes,Ua MODERATE (*)    Bacteria, UA RARE (*)    All other components within normal limits  BLOOD GAS, VENOUS - Abnormal; Notable for the following components:   pCO2, Ven 39.7 (*)    All other components within normal limits  TSH - Abnormal; Notable for the following components:   TSH 6.308 (*)    All other components within normal limits  BRAIN NATRIURETIC PEPTIDE - Abnormal; Notable for the following components:   B Natriuretic Peptide 305.1 (*)    All other components within normal limits  I-STAT CHEM 8, ED - Abnormal; Notable for the following components:   Potassium 3.3 (*)    Calcium, Ion 1.41 (*)    TCO2 21 (*)    Hemoglobin 12.2 (*)    HCT 36.0 (*)    All other components within normal limits  TROPONIN I (HIGH SENSITIVITY) - Abnormal; Notable for the following components:   Troponin I (High Sensitivity) 62 (*)    All other components within normal limits  TROPONIN I (HIGH SENSITIVITY) - Abnormal; Notable for the following components:   Troponin I (High Sensitivity) 58 (*)    All other components within normal limits  URINE CULTURE  CULTURE, BLOOD (ROUTINE X 2)  CULTURE, BLOOD (ROUTINE X 2)  SARS CORONAVIRUS 2 (TAT 6-24 HRS)  LACTIC ACID, PLASMA  PROTIME-INR  CK  ETHANOL  RAPID URINE DRUG SCREEN, HOSP PERFORMED  PROCALCITONIN  MAGNESIUM  COMPREHENSIVE METABOLIC PANEL  CBC  I-STAT VENOUS BLOOD GAS, ED    EKG EKG Interpretation  Date/Time:  Sunday October 15 2020 14:34:31 EST Ventricular Rate:  96 PR  Interval:    QRS Duration: 112 QT Interval:  384 QTC Calculation: 460 R Axis:   21 Text Interpretation: Atrial fibrillation Incomplete right bundle branch block Low voltage, extremity and precordial leads Confirmed by Quintella Reichert [phone]) on 10/15/2020 3:01:26 PM   Radiology DG Chest 1 View  Result Date: 10/15/2020 CLINICAL DATA:  Pain status post fall EXAM: CHEST  1 VIEW COMPARISON:  October 25, 2017 FINDINGS: The heart size is relatively stable. The thoracic aorta appears to be dilated. There are chronic airspace opacities at the lung bases. These are favored to represent atelectasis or scarring. There is no pneumothorax. No definite acute osseous abnormality. IMPRESSION: 1. No definite acute osseous abnormality. 2. Bibasilar atelectasis or scarring. 3. Dilated thoracic aorta. Electronically Signed   By: Constance Holster M.D.   On: 10/15/2020 15:06   DG Pelvis 1-2 Views  Result Date: 10/15/2020 CLINICAL DATA:  Acute pain due to fall. EXAM: PELVIS - 1-2 VIEW COMPARISON:  None. FINDINGS: There is diffuse osteopenia which limits detection of nondisplaced fractures. There is no acute displaced fracture or dislocation. There are moderate degenerative changes of both hips. There are degenerative changes of the lower lumbar spine. IMPRESSION: 1. No acute displaced fracture or dislocation. Diffuse osteopenia limits detection of nondisplaced fractures. 2. Moderate degenerative changes of both hips. Electronically Signed   By: Constance Holster M.D.   On: 10/15/2020 15:07   CT Head Wo Contrast  Result Date: 10/15/2020 CLINICAL DATA:  Pain after trauma EXAM: CT HEAD WITHOUT CONTRAST CT CERVICAL SPINE WITHOUT CONTRAST TECHNIQUE: Multidetector CT imaging of the head and cervical spine was performed following the standard protocol without intravenous contrast. Multiplanar CT image reconstructions of the cervical spine were also generated. COMPARISON:  CT scan of the head October 29, 2017. CT angio neck Nov 03, 2017. FINDINGS: CT HEAD FINDINGS Brain: No subdural, epidural, or subarachnoid hemorrhage. No mass effect or midline shift. Ventricles and sulci are stable. Cerebellum, brainstem, and basal cisterns are normal. White matter changes are identified. No acute cortical ischemia or infarct identified. Vascular: Calcified atherosclerosis in the intracranial carotids. Skull: Normal. Negative for fracture or focal lesion. Sinuses/Orbits: The left maxillary and sphenoid sinuses are completely opacified, unchanged since March of 2019. Opacification of scattered left mastoid air cells is identified without associated fracture or rosea in. No other abnormalities. Other: None. CT CERVICAL SPINE FINDINGS Alignment: C1 articulates with the occipital condyles. C1 is somewhat displaced posteriorly relative to the remainder of the cervical spine. This is more pronounced compared to the 11-03-2017 study. Anterolisthesis of C3 versus C4 is stable since 2019. Several other mild listheses are identified, also likely degenerative in nature. Anterolisthesis of C7 versus T1 is stable. Skull base and vertebrae: There are fractures through the right and left aspects of the C1 anterior arch. There is a fracture through the odontoid process best seen on sagittal image 25. No other fractures are identified. Soft tissues and spinal canal: Calcified pannus associated with the C1 level. This was also present in March of 2019. No other soft tissue abnormalities are identified. Disc levels: Multilevel degenerative changes. Facet degenerative changes. Upper chest: Negative. Other: No other abnormalities. IMPRESSION: 1. There is a fracture through the odontoid process as well as fracture through the anterior arch of C1. C1 is somewhat posteriorly displaced relative to the  remainder of the cervical spine. This was at least partially present in March of 2019 but does appear somewhat increased. The canal remains patent. 2. Degenerative changes in  the cervical spine. 3. No acute intracranial abnormalities. 4. Sinus disease as above. Findings called to Dr. Beecher Mcardle. Electronically Signed   By: Dorise Bullion III M.D   On: 10/15/2020 16:41   CT Cervical Spine Wo Contrast  Result Date: 10/15/2020 CLINICAL DATA:  Pain after trauma EXAM: CT HEAD WITHOUT CONTRAST CT CERVICAL SPINE WITHOUT CONTRAST TECHNIQUE: Multidetector CT imaging of the head and cervical spine was performed following the standard protocol without intravenous contrast. Multiplanar CT image reconstructions of the cervical spine were also generated. COMPARISON:  CT scan of the head October 29, 2017. CT angio neck November 14, 2017. FINDINGS: CT HEAD FINDINGS Brain: No subdural, epidural, or subarachnoid hemorrhage. No mass effect or midline shift. Ventricles and sulci are stable. Cerebellum, brainstem, and basal cisterns are normal. White matter changes are identified. No acute cortical ischemia or infarct identified. Vascular: Calcified atherosclerosis in the intracranial carotids. Skull: Normal. Negative for fracture or focal lesion. Sinuses/Orbits: The left maxillary and sphenoid sinuses are completely opacified, unchanged since March of 2019. Opacification of scattered left mastoid air cells is identified without associated fracture or rosea in. No other abnormalities. Other: None. CT CERVICAL SPINE FINDINGS Alignment: C1 articulates with the occipital condyles. C1 is somewhat displaced posteriorly relative to the remainder of the cervical spine. This is more pronounced compared to the 2017/11/14 study. Anterolisthesis of C3 versus C4 is stable since 2019. Several other mild listheses are identified, also likely degenerative in nature. Anterolisthesis of C7 versus T1 is stable. Skull base and vertebrae: There are fractures through the right and left aspects of the C1 anterior arch. There is a fracture through the odontoid process best seen on sagittal image 25. No other fractures are  identified. Soft tissues and spinal canal: Calcified pannus associated with the C1 level. This was also present in March of 2019. No other soft tissue abnormalities are identified. Disc levels: Multilevel degenerative changes. Facet degenerative changes. Upper chest: Negative. Other: No other abnormalities. IMPRESSION: 1. There is a fracture through the odontoid process as well as fracture through the anterior arch of C1. C1 is somewhat posteriorly displaced relative to the remainder of the cervical spine. This was at least partially present in March of 2019 but does appear somewhat increased. The canal remains patent. 2. Degenerative changes in the cervical spine. 3. No acute intracranial abnormalities. 4. Sinus disease as above. Findings called to Dr. Beecher Mcardle. Electronically Signed   By: Dorise Bullion III M.D   On: 10/15/2020 16:41    Procedures Procedures  CRITICAL CARE Performed by: Quintella Reichert   Total critical care time: 35 minutes  Critical care time was exclusive of separately billable procedures and treating other patients.  Critical care was necessary to treat or prevent imminent or life-threatening deterioration.  Critical care was time spent personally by me on the following activities: development of treatment plan with patient and/or surrogate as well as nursing, discussions with consultants, evaluation of patient's response to treatment, examination of patient, obtaining history from patient or surrogate, ordering and performing treatments and interventions, ordering and review of laboratory studies, ordering and review of radiographic studies, pulse oximetry and re-evaluation of patient's condition.  Medications Ordered in ED Medications  0.9 % NaCl with KCl 40 mEq / L  infusion (has no administration in time range)  acetaminophen (TYLENOL) tablet  650 mg (has no administration in time range)    Or  acetaminophen (TYLENOL) suppository 650 mg (has no administration in time  range)  ondansetron (ZOFRAN) tablet 4 mg (has no administration in time range)    Or  ondansetron (ZOFRAN) injection 4 mg (has no administration in time range)  sodium chloride flush (NS) 0.9 % injection 3 mL (has no administration in time range)  ceFAZolin (ANCEF) IVPB 1 g/50 mL premix (has no administration in time range)  vancomycin (VANCOCIN) IVPB 1000 mg/200 mL premix (0 mg Intravenous Stopped 10/15/20 2147)  ceFEPIme (MAXIPIME) 2 g in sodium chloride 0.9 % 100 mL IVPB (0 g Intravenous Stopped 10/15/20 1921)  magnesium sulfate IVPB 2 g 50 mL (0 g Intravenous Stopped 10/15/20 2035)  LORazepam (ATIVAN) injection 1 mg (1 mg Intravenous Given 10/15/20 1754)  thiamine (B-1) injection 100 mg (100 mg Intravenous Given 10/15/20 1759)  sodium chloride 0.9 % bolus 500 mL (500 mLs Intravenous New Bag/Given 10/15/20 2058)    ED Course  I have reviewed the triage vital signs and the nursing notes.  Pertinent labs & imaging results that were available during my care of the patient were reviewed by me and considered in my medical decision making (see chart for details).    MDM Rules/Calculators/A&P                          Patient brought in from home for evaluation after he could not be lifted off the floor. Initial reports were of fall, but it appears that he was gently led to the floor. He is disheveled on evaluation, confused and agitated. He does have a history of alcohol use. CBC with leukocytosis, similar when compared to priors. Troponin is minimally elevated, no active chest pain and no acute ischemic changes. Magnesium is low, will replace with IV magnesium. He was started on broad-spectrum antibiotics for possible sepsis. Lactic acid is elevated, unclear the source of this. Lactic acidosis may be secondary to underlying liver disease as no clear source of infection has been identified on initial evaluation. CT C-spine with C1 fracture. Discussed with neurosurgery on-call, recommends placing him in  a hard collar with neurosurgery follow-up. Hospitalist consulted for admission for ongoing treatment. Final Clinical Impression(s) / ED Diagnoses Final diagnoses:  None    Rx / DC Orders ED Discharge Orders    None       Quintella Reichert, MD 10/15/20 2254

## 2020-10-16 ENCOUNTER — Other Ambulatory Visit: Payer: Self-pay

## 2020-10-16 ENCOUNTER — Inpatient Hospital Stay (HOSPITAL_COMMUNITY): Payer: Medicare Other

## 2020-10-16 ENCOUNTER — Encounter (HOSPITAL_COMMUNITY): Payer: Self-pay | Admitting: Internal Medicine

## 2020-10-16 DIAGNOSIS — R609 Edema, unspecified: Secondary | ICD-10-CM

## 2020-10-16 DIAGNOSIS — R6520 Severe sepsis without septic shock: Secondary | ICD-10-CM | POA: Diagnosis not present

## 2020-10-16 DIAGNOSIS — A419 Sepsis, unspecified organism: Secondary | ICD-10-CM | POA: Diagnosis not present

## 2020-10-16 DIAGNOSIS — I4891 Unspecified atrial fibrillation: Secondary | ICD-10-CM

## 2020-10-16 LAB — BLOOD GAS, ARTERIAL
Acid-base deficit: 2 mmol/L (ref 0.0–2.0)
Bicarbonate: 22.8 mmol/L (ref 20.0–28.0)
Drawn by: 25770
FIO2: 28
O2 Content: 2 L/min
O2 Saturation: 93.8 %
Patient temperature: 98.6
pCO2 arterial: 41.5 mmHg (ref 32.0–48.0)
pH, Arterial: 7.359 (ref 7.350–7.450)
pO2, Arterial: 76.2 mmHg — ABNORMAL LOW (ref 83.0–108.0)

## 2020-10-16 LAB — COMPREHENSIVE METABOLIC PANEL
ALT: 27 U/L (ref 0–44)
AST: 53 U/L — ABNORMAL HIGH (ref 15–41)
Albumin: 2.2 g/dL — ABNORMAL LOW (ref 3.5–5.0)
Alkaline Phosphatase: 88 U/L (ref 38–126)
Anion gap: 9 (ref 5–15)
BUN: 22 mg/dL (ref 8–23)
CO2: 21 mmol/L — ABNORMAL LOW (ref 22–32)
Calcium: 9.8 mg/dL (ref 8.9–10.3)
Chloride: 112 mmol/L — ABNORMAL HIGH (ref 98–111)
Creatinine, Ser: 0.79 mg/dL (ref 0.61–1.24)
GFR, Estimated: >60 mL/min (ref >60)
Glucose, Bld: 78 mg/dL (ref 70–99)
Potassium: 3.8 mmol/L (ref 3.5–5.1)
Sodium: 142 mmol/L (ref 135–145)
Total Bilirubin: 1.4 mg/dL — ABNORMAL HIGH (ref 0.3–1.2)
Total Protein: 5 g/dL — ABNORMAL LOW (ref 6.5–8.1)

## 2020-10-16 LAB — CBC
HCT: 33 % — ABNORMAL LOW (ref 39.0–52.0)
Hemoglobin: 10.6 g/dL — ABNORMAL LOW (ref 13.0–17.0)
MCH: 33 pg (ref 26.0–34.0)
MCHC: 32.1 g/dL (ref 30.0–36.0)
MCV: 102.8 fL — ABNORMAL HIGH (ref 80.0–100.0)
Platelets: 270 10*3/uL (ref 150–400)
RBC: 3.21 MIL/uL — ABNORMAL LOW (ref 4.22–5.81)
RDW: 14.4 % (ref 11.5–15.5)
WBC: 12.6 10*3/uL — ABNORMAL HIGH (ref 4.0–10.5)
nRBC: 0 % (ref 0.0–0.2)

## 2020-10-16 LAB — ECHOCARDIOGRAM COMPLETE
Height: 71 in
S' Lateral: 1.98 cm
Weight: 2880 oz

## 2020-10-16 LAB — SARS CORONAVIRUS 2 (TAT 6-24 HRS): SARS Coronavirus 2: NEGATIVE

## 2020-10-16 LAB — PROCALCITONIN: Procalcitonin: <0.1 ng/mL

## 2020-10-16 LAB — MAGNESIUM: Magnesium: 2 mg/dL (ref 1.7–2.4)

## 2020-10-16 MED ORDER — ACETYLCYSTEINE 20 % IN SOLN
4.0000 mL | Freq: Three times a day (TID) | RESPIRATORY_TRACT | Status: DC
  Filled 2020-10-16 (×2): qty 4

## 2020-10-16 MED ORDER — KCL-LACTATED RINGERS-D5W 20 MEQ/L IV SOLN
INTRAVENOUS | Status: AC
  Filled 2020-10-16 (×4): qty 1000

## 2020-10-16 MED ORDER — POTASSIUM CHLORIDE IN NACL 40-0.9 MEQ/L-% IV SOLN
INTRAVENOUS | Status: DC
  Filled 2020-10-16: qty 1000

## 2020-10-16 MED ORDER — ACETYLCYSTEINE 20 % IN SOLN
3.0000 mL | Freq: Every day | RESPIRATORY_TRACT | Status: DC | PRN
  Filled 2020-10-16: qty 4

## 2020-10-16 NOTE — Progress Notes (Signed)
Orthopedic Tech Progress Note Patient Details:  Ian [name] [birth year] [id number]  Patient ID: Ian [name], male   DOB: 08-06-1926, 87 y.o.   MRN: [id number]   Ian [name] 10/16/2020, 9:55 AM Pt placed in hard cervical collar per verbal instructions from RN

## 2020-10-16 NOTE — Plan of Care (Signed)
  Problem: Respiratory: Goal: Will maintain a patent airway Outcome: Progressing   Problem: Safety: Goal: Ability to remain free from injury will improve Outcome: Progressing

## 2020-10-16 NOTE — Evaluation (Signed)
Clinical/Bedside Swallow Evaluation Patient Details  Name: Ian [name] MRN: [id number] Date of Birth: 1926/07/05  Today's Date: 10/16/2020 Time: SLP Start Time (ACUTE ONLY): 1200 SLP Stop Time (ACUTE ONLY): 1220 SLP Time Calculation (min) (ACUTE ONLY): 20 min  Past Medical History:  Past Medical History:  Diagnosis Date  . 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 Calculus of kidney 03/31/2013   Overview:  IMPRESSION: see ER note   . Hypertension   . Leukocytosis 10/26/2017  . Loss of weight 10/26/2017  . Multiple leg contusions, unspecified laterality, sequela 10/26/2017  . Scalp hematoma, subsequent encounter 10/26/2017  . Stroke [facility])    Past Surgical History:  Past Surgical History:  Procedure Laterality Date  . ESOPHAGOGASTRODUODENOSCOPY (EGD) WITH PROPOFOL N/A 11/09/2017   Procedure: ESOPHAGOGASTRODUODENOSCOPY (EGD) WITH PROPOFOL;  Surgeon: Laurence Spates, MD;  Location: [facility];  Service: Endoscopy;  Laterality: N/A;   HPI:  Patient is a 87 y.o. male with PMH: CVA (2019), HTN, HLD, hypothyroidism, alcohol use disorder, dementia, who presented to ED via EMS for evaluation of confusion. Per wife's report at time of admission, patient had fall 2-3 days prior resulting in right shin injury. She found patient down on floor presumed to be from unwitnessed fall. Head CT did not reveal any acute intracranial abnormalities. CXR revealed chronic airspace opacities at lung bases favoring atelectasis or scarring.   Assessment / Plan / Recommendation Clinical Impression  Patient presents with a mod-severe oropharyngeal dysphagia with likely impact from current state of lethargy. SLP spent much of evaluation performing oral care with toothbrush and paste, toothette swabs and yaunkers suction. Moderate amount of dry/sticky and thick yellow-brownish secretions removed from gumline, hard palate, soft palate and on tongue. Patient able to cough when cued and was able to move some secretions to  oropharynx for SLP to suction out. Patient's voice sounded wet/gurgled but improved after oral care and expectoration of secretions. Toothette sponge with small amount of water given during oral care, however SLP did not trial any other PO's secondary to significant amount of secretions and patient's current state of lethargy.      Aspiration Risk  Severe aspiration risk;Risk for inadequate nutrition/hydration    Diet Recommendation NPO   Medication Administration: Via alternative means    Other  Recommendations Oral Care Recommendations: Oral care QID   Follow up Recommendations Skilled Nursing facility;24 hour supervision/assistance      Frequency and Duration min 2x/week  1 week       Prognosis Prognosis for Safe Diet Advancement: Good      Swallow Study   General Date of Onset: 10/15/20 HPI: Patient is a 87 y.o. male with PMH: CVA (2019), HTN, HLD, hypothyroidism, alcohol use disorder, dementia, who presented to ED via EMS for evaluation of confusion. Per wife's report at time of admission, patient had fall 2-3 days prior resulting in right shin injury. She found patient down on floor presumed to be from unwitnessed fall. Head CT did not reveal any acute intracranial abnormalities. CXR revealed chronic airspace opacities at lung bases favoring atelectasis or scarring. Type of Study: Bedside Swallow Evaluation Previous Swallow Assessment: 2019 after CVA Diet Prior to this Study: NPO Temperature Spikes Noted: No Respiratory Status: Nasal cannula History of Recent Intubation: No Behavior/Cognition: Cooperative;Alert;Lethargic/Drowsy Oral Cavity Assessment: Dried secretions;Excessive secretions Oral Care Completed by SLP: Yes Oral Cavity - Dentition: Adequate natural dentition Patient Positioning: Upright in bed Baseline Vocal Quality: Wet Volitional Cough: Strong Volitional Swallow: Unable to elicit    Oral/Motor/Sensory  Function Overall Oral Motor/Sensory Function: Within  functional limits   Ice Chips     Thin Liquid Thin Liquid: Impaired Presentation:  (very minimal amount on toothette sponge) Other Comments: SLP administered very minimal amount of plain thin water on toothette sponge. Patient continuously coughing up and expectorating phlegm which SLP removed with suction    Nectar Thick Nectar Thick Liquid: Not tested   Honey Thick Honey Thick Liquid: Not tested   Puree Puree: Not tested   Solid     Solid: Not tested      Sonia Baller, MA, CCC-SLP Speech Therapy

## 2020-10-16 NOTE — Progress Notes (Signed)
  Echocardiogram 2D echo attempted but it was very limited due to patient being uncooperative.  Ian [name] 10/16/2020, 1:47 PM

## 2020-10-16 NOTE — Consult Note (Signed)
[place] Nurse Consult Note: Reason for Consult: Full thickness skin tear with hematoma sustained during fall Wound type:traumatic Pressure Injury POA: N/A Measurement:TO be obtained by Bedside RN prior to placement of first dressing and documented on Nursing Flow Sheet (LxWxD in cm) Wound bed: partially obscured by the presence of hematoma, dried epidermis Drainage (amount, consistency, odor) Small serosanguinous Periwound: ecchymosis, hemosiderin staining Dressing procedure/placement/frequency: Topical care will consist of twice daily cleansing and topping with a antimicrobial nonadherent (xeroform) gauze. We will provide bilateral pressure redistribution heel boots and a sacral prophylactic dressing for pressure injury prevention.  [place] nursing team will not follow, but will remain available to this patient, the nursing and medical teams.  Please re-consult if needed. Thanks, Maudie Flakes, MSN, RN, [place], Arther Abbott  Pager# [phone]

## 2020-10-16 NOTE — CV Procedure (Signed)
BLE venous duplex completed.  Results can be found under chart review under CV PROC. 10/16/2020 11:40 AM Niko Penson RVT, RDMS

## 2020-10-16 NOTE — Progress Notes (Signed)
PROGRESS NOTE  Ian [name] GBT:[id number] DOB: 1926/06/24 DOA: 10/15/2020 PCP: Bernerd Limbo, MD  HPI/Recap of past 24 hours: Ian [name] is a 87 y.o. male with medical history significant for history of CVA on Plavix, hypertension, hyperlipidemia, hypothyroidism, RBBB, alcohol use disorder, dementia who was brought to the ED via EMS for evaluation of confusion.  Patient is unable to provide any history due to encephalopathy and majority history is otherwise obtained from EDP, chart review, and wife by phone.  Per wife, patient had a stroke in 2019 which has resulted in chronic right foot drop.  He uses a walker to ambulate but has had several falls recently.  He had a fall 2 to 3 days ago resulting in injury to his left shin.  Today he was found down at home by his wife presumed due to an unwitnessed fall.  He had apparently stayed on the floor overnight before he was found.  Wife was unable to get him up and he was noted to be more confused than baseline therefore she called EMS for further assistance.  Per ED documentation, EMS reported that patient's room was significantly disheveled/unkempt with feces and body fluids present.  Rest of the house including wife's separate room was clean and tidy.  Per patient's wife, he has been taking his medications regularly.  He does drink alcohol about 1 beer and 1 cocktail per day but denies further heavy alcohol use.  She says he has not had a drink in the last 2-3 days due to feeling unwell.  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  Also revealed severe sepsis secondary to left lower extremity cellulitis.  Hypersomnolent.  10/16/20:  Seen and examined at his bedside.  Hypersomnolent.  ABG this AM 7.359/41/76 on FiO2 28.  Audible rales on exam.  RT consulted for suctioning.  Mucomyst nebs PRN.  Repeating CXR this afternoon to r/o mucous plugging with hypoxemia.  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 Presented with hypothermia, tachypnea, leukocytosis, tachycardia, lactic acidosis.  Evidence of soft tissue skin infection/cellulitis in left lower extremity. Continue cefazolin. Maintain MAP greater than 65 Currently n.p.o. Start gentle IV fluid hydration [id number] at 30cc/hr x2 days, judicious on IV fluid due to 2 volume overload on exam.  Acute metabolic encephalopathy likely multifactorial secondary to sepsis, acute illness, possibly UTI. History of dementia CT head no acute intracranial abnormalities. UA positive for pyuria, urine culture in process. Blood cultures x2 in process.  Bilateral lower extremity edema 4+ pitting edema lower extremities bilaterally Elevated BNP Bilateral lower extremity duplex ultrasound.  Not completed, the patient noncooperative. Judicious IV fluid in the setting of severe sepsis  Suspected dysphagia with concern for aspiration Speech therapist consulted, n.p.o. for now. Continue aspiration precautions.  Elevated liver chemistries likely secondary to alcohol abuse 2:1 ratio of AST: ALT Elevated T bili 1.4 Avoid hepatotoxic agents Monitor.  Resolved hypokalemia, post repletion. Repleted intravenously. Magnesium 2.0.  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   Poor functional status Dementia Alcohol abuse Palliative care team consulted to assist with establishing goals of care. Patient is currently full code.    Code Status: Full code.  Family Communication: Updated his wife at bedside.  Disposition Plan: Likely will discharge to SNF.   Consultants:  Neurosurgery.  Procedures:  None.  Antimicrobials:  Ancef.  DVT prophylaxis:  SCDs  Status is: Inpatient    Dispo:  Patient From: Home  Planned Disposition: [place]  Expected discharge date: 10/18/2020  Medically stable for discharge: No, ongoing management of severe sepsis secondary to left lower extremity cellulitis and New type II dens fracture and C1 arch fractures.            Objective: Vitals:   10/16/20 0232 10/16/20 0622 10/16/20 1022 10/16/20 1417  BP: 105/70 106/78 108/81 103/71  Pulse: 87 87 83 83  Resp: (!) 22 19 16 15   Temp: 98.1 F (36.7 C) 97.8 F (36.6 C) 97.9 F (36.6 C) 98.1 F (36.7 C)  TempSrc:      SpO2: 97% 98% (!) 81% (!) 72%  Weight:      Height:        Intake/Output Summary (Last 24 hours) at 10/16/2020 1616 Last data filed at 10/16/2020 1504 Gross per 24 hour  Intake 1430.02 ml  Output --  Net 1430.02 ml   Filed Weights   10/15/20 1328  Weight: 81.6 kg    Exam:  . General: 87 y.o. year-old male frail-appearing.  Somnolent.   . Cardiovascular: Regular rate and rhythm with no rubs or gallops.  No thyromegaly or JVD noted.   Marland Kitchen Respiratory: Mild rales noted bilaterally with no wheezing.  Poor inspiratory effort.   . Abdomen: Soft nontender nondistended with normal bowel sounds x4 quadrants. . Musculoskeletal: 4+ pitting edema in lower extremities bilaterally.  Left lower extremity with warmth, erythema, edema, tenderness.  . Skin: Cellulitis affecting left lower extremity. Marland Kitchen Psychiatry: Unable to assess mood due to somnolence.   Data Reviewed: CBC: Recent Labs   Lab 10/15/20 1415 10/15/20 1430 10/16/20 0335  WBC 15.6*  --  12.6*  NEUTROABS 13.4*  --   --   HGB 11.3* 12.2* 10.6*  HCT 34.0* 36.0* 33.0*  MCV 99.7  --  102.8*  PLT 316  --  829   Basic Metabolic Panel: Recent Labs  Lab 10/15/20 1415 10/15/20 1430 10/16/20 0335  NA 138 140 142  K 3.4* 3.3* 3.8  CL 106 106 112*  CO2 22  --  21*  GLUCOSE 88 82 78  BUN 23 20 22   CREATININE 0.88 0.70 0.79  CALCIUM 9.6  --  9.8  MG 1.5*  --  2.0   GFR: Estimated Creatinine Clearance: 62.8 mL/min (by C-G formula based on SCr of 0.79 mg/dL). Liver Function Tests: Recent Labs  Lab 10/15/20 1415 10/16/20 0335  AST 52* 53*  ALT 26 27  ALKPHOS 96 88  BILITOT 1.5* 1.4*  PROT 5.2* 5.0*  ALBUMIN 2.4* 2.2*   No results for input(s): LIPASE, AMYLASE in the last 168 hours. No results for input(s): AMMONIA in the last 168 hours. Coagulation Profile: Recent Labs  Lab 10/15/20 1415  INR 1.1   Cardiac Enzymes: Recent Labs  Lab 10/15/20 1415  CKTOTAL  363   BNP (last 3 results) No results for input(s): PROBNP in the last 8760 hours. HbA1C: No results for input(s): HGBA1C in the last 72 hours. CBG: No results for input(s): GLUCAP in the last 168 hours. Lipid Profile: No results for input(s): CHOL, HDL, LDLCALC, TRIG, CHOLHDL, LDLDIRECT in the last 72 hours. Thyroid Function Tests: Recent Labs    10/15/20 2043  TSH 6.308*   Anemia Panel: No results for input(s): VITAMINB12, FOLATE, FERRITIN, TIBC, IRON, RETICCTPCT in the last 72 hours. Urine analysis:    Component Value Date/Time   COLORURINE YELLOW 10/15/2020 2043   APPEARANCEUR HAZY (A) 10/15/2020 2043   LABSPEC 1.019 10/15/2020 2043   PHURINE 5.0 10/15/2020 2043   GLUCOSEU NEGATIVE 10/15/2020 2043   HGBUR NEGATIVE 10/15/2020 2043   BILIRUBINUR NEGATIVE 10/15/2020 2043   KETONESUR NEGATIVE 10/15/2020 2043   PROTEINUR NEGATIVE 10/15/2020 2043   UROBILINOGEN 1.0 01/25/2010 1355   NITRITE NEGATIVE 10/15/2020 2043    LEUKOCYTESUR MODERATE (A) 10/15/2020 2043   Sepsis Labs: @LABRCNTIP (procalcitonin:4,lacticidven:4)  ) Recent Results (from the past 240 hour(s))  Culture, blood (routine x 2)     Status: None (Preliminary result)   Collection Time: 10/15/20  2:35 PM   Specimen: BLOOD LEFT FOREARM  Result Value Ref Range Status   Specimen Description   Final    BLOOD LEFT FOREARM Performed at [facility], [place] [address], [place], [place] [address]    Special Requests   Final    BOTTLES DRAWN AEROBIC AND ANAEROBIC Blood Culture results may not be optimal due to an inadequate volume of blood received in culture bottles Performed at [facility] [address], [place], [place] [address]    Culture PENDING  Incomplete   Report Status PENDING  Incomplete  SARS CORONAVIRUS 2 (TAT 6-24 HRS) Nasopharyngeal Nasopharyngeal Swab     Status: None   Collection Time: 10/15/20  9:33 PM   Specimen: Nasopharyngeal Swab  Result Value Ref Range Status   SARS Coronavirus 2 NEGATIVE NEGATIVE Final    Comment: (NOTE) SARS-CoV-2 target nucleic acids are NOT DETECTED.  The SARS-CoV-2 RNA is generally detectable in upper and lower respiratory specimens during the acute phase of infection. Negative results do not preclude SARS-CoV-2 infection, do not rule out co-infections with other pathogens, and should not be used as the sole basis for treatment or other patient management decisions. Negative results must be combined with clinical observations, patient history, and epidemiological information. The expected result is Negative.  Fact Sheet for Patients: SugarRoll.be  Fact Sheet for Healthcare Providers: https://www.woods-mathews.com/  This test is not yet approved or cleared by the [place] FDA and  has been authorized for detection and/or diagnosis of SARS-CoV-2 by FDA under an Emergency Use Authorization (EUA). This EUA will remain  in  effect (meaning this test can be used) for the duration of the COVID-19 declaration under Se ction 564(b)(1) of the Act, 21 U.S.C. section 360bbb-3(b)(1), unless the authorization is terminated or revoked sooner.  Performed at [facility], [place] [address], [place], [place] [address]       Studies: VAS [place] LOWER EXTREMITY VENOUS (DVT)  Result Date: 10/16/2020  Lower Venous DVT Study Indications: BLE edema.  Limitations: Patient AMS with continous movement & wound to LLE calf. Comparison Study: Previous exam 01/20/18 - negative Performing Technologist: Rogelia Rohrer  Examination Guidelines: A complete evaluation includes B-mode imaging, spectral Doppler, color Doppler, and power Doppler as needed of all accessible portions of  each vessel. Bilateral testing is considered an integral part of a complete examination. Limited examinations for reoccurring indications may be performed as noted. The reflux portion of the exam is performed with the patient in reverse Trendelenburg.  +---------+---------------+---------+-----------+----------+--------------+ RIGHT    CompressibilityPhasicitySpontaneityPropertiesThrombus Aging +---------+---------------+---------+-----------+----------+--------------+ CFV      Full           Yes      Yes                                 +---------+---------------+---------+-----------+----------+--------------+ SFJ      Full                                                        +---------+---------------+---------+-----------+----------+--------------+ FV Prox  Full           Yes      Yes                                 +---------+---------------+---------+-----------+----------+--------------+ FV Mid   Full           Yes      Yes                                 +---------+---------------+---------+-----------+----------+--------------+ FV DistalFull           Yes      Yes                                  +---------+---------------+---------+-----------+----------+--------------+ PFV      Full                                                        +---------+---------------+---------+-----------+----------+--------------+ POP      Full           Yes      Yes                                 +---------+---------------+---------+-----------+----------+--------------+ PTV      Full                                                        +---------+---------------+---------+-----------+----------+--------------+ PERO     Full                                                        +---------+---------------+---------+-----------+----------+--------------+   +---------+---------------+---------+-----------+----------+------------------+ LEFT     CompressibilityPhasicitySpontaneityPropertiesThrombus Aging     +---------+---------------+---------+-----------+----------+------------------+ CFV      Full  Yes      Yes                                     +---------+---------------+---------+-----------+----------+------------------+ SFJ      Full                                                            +---------+---------------+---------+-----------+----------+------------------+ FV Prox  Full           Yes      Yes                                     +---------+---------------+---------+-----------+----------+------------------+ FV Mid   Full           Yes      Yes                                     +---------+---------------+---------+-----------+----------+------------------+ FV DistalFull           Yes      Yes                                     +---------+---------------+---------+-----------+----------+------------------+ PFV      Full                                                            +---------+---------------+---------+-----------+----------+------------------+ POP      Full           Yes      Yes                                      +---------+---------------+---------+-----------+----------+------------------+ PTV                                                   Not seen on this                                                         exam               +---------+---------------+---------+-----------+----------+------------------+ PERO                                                  Not seen on this  exam               +---------+---------------+---------+-----------+----------+------------------+     Summary: BILATERAL: - No evidence of deep vein thrombosis seen in the lower extremities, bilaterally. - No evidence of superficial venous thrombosis in the lower extremities, bilaterally. -No evidence of popliteal cyst, bilaterally. RIGHT: Subcutaneous edema from distal thigh downwards.  LEFT: - Unable to see posterial tibial and peroneal veins due to tissue properties and continous movement of patient. Subcutaneous edema from distal thigh downward.  *See table(s) above for measurements and observations.    Preliminary     Scheduled Meds: . sodium chloride flush  3 mL Intravenous Q12H    Continuous Infusions: . 0.9 % NaCl with KCl 40 mEq / L 50 mL/hr at 10/16/20 1504  .  ceFAZolin (ANCEF) IV 1 g (10/16/20 1450)     LOS: 1 day     Kayleen Memos, MD [facility] Pager [phone]  If 7PM-7AM, please contact night-coverage [email or web address] Password [facility] 10/16/2020, 4:16 PM

## 2020-10-16 NOTE — Progress Notes (Signed)
Used Yankauer to suction PT mouth and throat. Resulted in moderate, thick, tan product. PT is somewhat combative and verbally inapprpriate at this time. PT will not leave 02 on but current Sp02 on RA 93%. Did not give neb at this time because PT is pulling items off and suctioning has cleared airway at this time.

## 2020-10-16 NOTE — ED Notes (Signed)
ED TO INPATIENT HANDOFF REPORT  Name/Age/Gender Ian [name] 87 y.o. male  Code Status    Code Status Orders  (From admission, onward)         Start     Ordered   10/15/20 2039  Full code  Continuous        10/15/20 2039        Code Status History    Date Active Date Inactive Code Status Order ID Comments User Context   11/09/2017 0113 11/11/2017 2101 Full Code [id number]  Toy Baker, MD Inpatient   10/29/2017 1737 11/09/2017 0054 Full Code [id number]  Cathlyn Parsons, PA-C Inpatient   10/29/2017 1737 10/29/2017 1737 Full Code [id number]  Cathlyn Parsons, PA-C Inpatient   10/25/2017 1908 10/29/2017 1724 Full Code [id number]  Everrett Coombe, MD ED   Advance Care Planning Activity      Home/SNF/Other Home  Chief Complaint Fracture of anterior arch of C1 ([place]) [X32.355D]  Level of Care/Admitting Diagnosis ED Disposition    ED Disposition Condition [facility] Area: [facility] [[id number]]  Level of Care: Telemetry [5]  Admit to tele based on following criteria: Complex arrhythmia (Bradycardia/Tachycardia)  May admit patient to [facility] or [facility] if equivalent level of care is available:: No  Covid Evaluation: Asymptomatic Screening Protocol (No Symptoms)  Diagnosis: Fracture of anterior arch of C1 [facility]) [322025]  Admitting Physician: Lenore Cordia [[id number]]  Attending Physician: Lenore Cordia [[id number]]  Estimated length of stay: past midnight tomorrow  Certification:: I certify this patient will need inpatient services for at least 2 midnights       Medical History Past Medical History:  Diagnosis Date  . 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 Calculus of kidney 03/31/2013   Overview:  IMPRESSION: see ER note   . Hypertension   . Leukocytosis 10/26/2017  . Loss of weight 10/26/2017  . Multiple leg contusions, unspecified laterality, sequela 10/26/2017  . Scalp hematoma, subsequent encounter 10/26/2017  . Stroke [facility])      Allergies No Known Allergies  IV Location/Drains/Wounds Patient Lines/Drains/Airways Status    Active Line/Drains/Airways    Name Placement date Placement time Site Days   Peripheral IV 10/15/20 Left Antecubital 10/15/20  --  Antecubital  1   Peripheral IV 10/15/20 Right Antecubital 10/15/20  2044  Antecubital  1          Labs/Imaging Results for orders placed or performed during the hospital encounter of 10/15/20 (from the past 48 hour(s))  Lactic acid, plasma     Status: Abnormal   Collection Time: 10/15/20  2:10 PM  Result Value Ref Range   Lactic Acid, Venous 3.5 (HH) 0.5 - 1.9 mmol/L    Comment: CRITICAL RESULT CALLED TO, READ BACK BY AND VERIFIED WITH: DANIEL,C AT 1452 ON 10/15/2020 BY JPM Performed at [place] [address] [place], [place] [address]   Comprehensive metabolic panel     Status: Abnormal   Collection Time: 10/15/20  2:15 PM  Result Value Ref Range   Sodium 138 135 - 145 mmol/L   Potassium 3.4 (L) 3.5 - 5.1 mmol/L   Chloride 106 98 - 111 mmol/L   CO2 22 22 - 32 mmol/L   Glucose, Bld 88 70 - 99 mg/dL    Comment: Glucose reference range applies only to samples taken after fasting for at least 8 hours.   BUN 23 8 - 23 mg/dL   Creatinine, Ser 0.88  0.61 - 1.24 mg/dL   Calcium 9.6 8.9 - 10.3 mg/dL   Total Protein 5.2 (L) 6.5 - 8.1 g/dL   Albumin 2.4 (L) 3.5 - 5.0 g/dL   AST 52 (H) 15 - 41 U/L   ALT 26 0 - 44 U/L   Alkaline Phosphatase 96 38 - 126 U/L   Total Bilirubin 1.5 (H) 0.3 - 1.2 mg/dL   GFR, Estimated >60 >60 mL/min    Comment: (NOTE) Calculated using the CKD-EPI Creatinine Equation (2021)    Anion gap 10 5 - 15    Comment: Performed at [facility], [place] [address], [place], [place] [address]  Troponin I (High Sensitivity)     Status: Abnormal   Collection Time: 10/15/20  2:15 PM  Result Value Ref Range   Troponin I (High Sensitivity) 62 (H) <18 ng/L    Comment: (NOTE) Elevated high  sensitivity troponin I (hsTnI) values and significant  changes across serial measurements may suggest ACS but many other  chronic and acute conditions are known to elevate hsTnI results.  Refer to the "Links" section for chest pain algorithms and additional  guidance. Performed at [facility], [place] [address] [place], [place], [place] [address]   CBC with Differential     Status: Abnormal   Collection Time: 10/15/20  2:15 PM  Result Value Ref Range   WBC 15.6 (H) 4.0 - 10.5 K/uL   RBC 3.41 (L) 4.22 - 5.81 MIL/uL   Hemoglobin 11.3 (L) 13.0 - 17.0 g/dL   HCT 34.0 (L) 39.0 - 52.0 %   MCV 99.7 80.0 - 100.0 fL   MCH 33.1 26.0 - 34.0 pg   MCHC 33.2 30.0 - 36.0 g/dL   RDW 14.1 11.5 - 15.5 %   Platelets 316 150 - 400 K/uL   nRBC 0.0 0.0 - 0.2 %   Neutrophils Relative % 86 %   Neutro Abs 13.4 (H) 1.7 - 7.7 K/uL   Lymphocytes Relative 6 %   Lymphs Abs 0.9 0.7 - 4.0 K/uL   Monocytes Relative 7 %   Monocytes Absolute 1.1 (H) 0.1 - 1.0 K/uL   Eosinophils Relative 0 %   Eosinophils Absolute 0.1 0.0 - 0.5 K/uL   Basophils Relative 0 %   Basophils Absolute 0.0 0.0 - 0.1 K/uL   Immature Granulocytes 1 %   Abs Immature Granulocytes 0.08 (H) 0.00 - 0.07 K/uL    Comment: Performed at [facility], [place] [address] [place], [place] [address]  Protime-INR     Status: None   Collection Time: 10/15/20  2:15 PM  Result Value Ref Range   Prothrombin Time 13.6 11.4 - 15.2 seconds   INR 1.1 0.8 - 1.2    Comment: (NOTE) INR goal varies based on device and disease states. Performed at [facility], [place] [address] [place], [place] [address]   CK     Status: None   Collection Time: 10/15/20  2:15 PM  Result Value Ref Range   Total CK 363 49 - 397 U/L    Comment: Performed at [facility], [place] [address] [place], [place] [address]  Magnesium     Status: Abnormal   Collection Time: 10/15/20  2:15 PM  Result Value Ref Range    Magnesium 1.5 (L) 1.7 - 2.4 mg/dL    Comment: Performed at [facility], [place] [address], [place], [place] [address]  I-stat chem 8, ED (not at [facility] or [facility])  Status: Abnormal   Collection Time: 10/15/20  2:30 PM  Result Value Ref Range   Sodium 140 135 - 145 mmol/L   Potassium 3.3 (L) 3.5 - 5.1 mmol/L   Chloride 106 98 - 111 mmol/L   BUN 20 8 - 23 mg/dL   Creatinine, Ser 0.70 0.61 - 1.24 mg/dL   Glucose, Bld 82 70 - 99 mg/dL    Comment: Glucose reference range applies only to samples taken after fasting for at least 8 hours.   Calcium, Ion 1.41 (H) 1.15 - 1.40 mmol/L   TCO2 21 (L) 22 - 32 mmol/L   Hemoglobin 12.2 (L) 13.0 - 17.0 g/dL   HCT 36.0 (L) 39.0 - 52.0 %  Blood gas, venous (at [facility] and [facility], not at [facility]     Status: Abnormal   Collection Time: 10/15/20  3:00 PM  Result Value Ref Range   pH, Ven 7.372 7.250 - 7.430   pCO2, Ven 39.7 (L) 44.0 - 60.0 mmHg   pO2, Ven 35.7 32.0 - 45.0 mmHg   Bicarbonate 22.5 20.0 - 28.0 mmol/L   Acid-base deficit 2.0 0.0 - 2.0 mmol/L   O2 Saturation 59.0 %   Patient temperature 98.6     Comment: Performed at [facility], [place] [address], [place], [place] [address]  Brain natriuretic peptide     Status: Abnormal   Collection Time: 10/15/20  8:23 PM  Result Value Ref Range   B Natriuretic Peptide 305.1 (H) 0.0 - 100.0 pg/mL    Comment: Performed at [facility], [place] [address] [place], [place] [address]  Lactic acid, plasma     Status: None   Collection Time: 10/15/20  8:43 PM  Result Value Ref Range   Lactic Acid, Venous 1.4 0.5 - 1.9 mmol/L    Comment: Performed at [facility], [place] [address] [place], [place] [address]  Urinalysis, Routine w reflex microscopic Urine, Clean Catch     Status: Abnormal   Collection Time: 10/15/20  8:43 PM  Result Value Ref Range   Color, Urine YELLOW YELLOW   APPearance HAZY (A) CLEAR   Specific Gravity, Urine 1.019 1.005 - 1.030    pH 5.0 5.0 - 8.0   Glucose, UA NEGATIVE NEGATIVE mg/dL   Hgb urine dipstick NEGATIVE NEGATIVE   Bilirubin Urine NEGATIVE NEGATIVE   Ketones, ur NEGATIVE NEGATIVE mg/dL   Protein, ur NEGATIVE NEGATIVE mg/dL   Nitrite NEGATIVE NEGATIVE   Leukocytes,Ua MODERATE (A) NEGATIVE   RBC / HPF 0-5 0 - 5 RBC/hpf   WBC, UA 21-50 0 - 5 WBC/hpf   Bacteria, UA RARE (A) NONE SEEN   Mucus PRESENT     Comment: Performed at [facility], [place] [address], [place], [place] [address]  Troponin I (High Sensitivity)     Status: Abnormal   Collection Time: 10/15/20  8:43 PM  Result Value Ref Range   Troponin I (High Sensitivity) 58 (H) <18 ng/L    Comment: (NOTE) Elevated high sensitivity troponin I (hsTnI) values and significant  changes across serial measurements may suggest ACS but many other  chronic and acute conditions are known to elevate hsTnI results.  Refer to the "Links" section for chest pain algorithms and additional  guidance. Performed at [facility], [place] [address] [place], [place] [address]   Ethanol     Status: None   Collection Time: 10/15/20  8:43 PM  Result Value Ref Range   Alcohol, Ethyl (  B) <10 <10 mg/dL    Comment: (NOTE) Lowest detectable limit for serum alcohol is 10 mg/dL.  For medical purposes only. Performed at [facility], [place] [address] [place], [place] [address]   Urine rapid drug screen (hosp performed)     Status: None   Collection Time: 10/15/20  8:43 PM  Result Value Ref Range   Opiates NONE DETECTED NONE DETECTED   Cocaine NONE DETECTED NONE DETECTED   Benzodiazepines NONE DETECTED NONE DETECTED   Amphetamines NONE DETECTED NONE DETECTED   Tetrahydrocannabinol NONE DETECTED NONE DETECTED   Barbiturates NONE DETECTED NONE DETECTED    Comment: (NOTE) DRUG SCREEN FOR MEDICAL PURPOSES ONLY.  IF CONFIRMATION IS NEEDED FOR ANY PURPOSE, NOTIFY LAB WITHIN 5 DAYS.  LOWEST DETECTABLE LIMITS FOR URINE  DRUG SCREEN Drug Class                     Cutoff (ng/mL) Amphetamine and metabolites    1000 Barbiturate and metabolites    200 Benzodiazepine                 119 Tricyclics and metabolites     300 Opiates and metabolites        300 Cocaine and metabolites        300 THC                            50 Performed at [facility], [place] [address], [place], [place] [address]   TSH     Status: Abnormal   Collection Time: 10/15/20  8:43 PM  Result Value Ref Range   TSH 6.308 (H) 0.350 - 4.500 uIU/mL    Comment: Performed by a 3rd Generation assay with a functional sensitivity of <=0.01 uIU/mL. Performed at [facility], [place] [address] [place], [place] [address]    DG Chest 1 View  Result Date: 10/15/2020 CLINICAL DATA:  Pain status post fall EXAM: CHEST  1 VIEW COMPARISON:  October 25, 2017 FINDINGS: The heart size is relatively stable. The thoracic aorta appears to be dilated. There are chronic airspace opacities at the lung bases. These are favored to represent atelectasis or scarring. There is no pneumothorax. No definite acute osseous abnormality. IMPRESSION: 1. No definite acute osseous abnormality. 2. Bibasilar atelectasis or scarring. 3. Dilated thoracic aorta. Electronically Signed   By: Constance Holster M.D.   On: 10/15/2020 15:06   DG Pelvis 1-2 Views  Result Date: 10/15/2020 CLINICAL DATA:  Acute pain due to fall. EXAM: PELVIS - 1-2 VIEW COMPARISON:  None. FINDINGS: There is diffuse osteopenia which limits detection of nondisplaced fractures. There is no acute displaced fracture or dislocation. There are moderate degenerative changes of both hips. There are degenerative changes of the lower lumbar spine. IMPRESSION: 1. No acute displaced fracture or dislocation. Diffuse osteopenia limits detection of nondisplaced fractures. 2. Moderate degenerative changes of both hips. Electronically Signed   By: Constance Holster M.D.   On: 10/15/2020 15:07    CT Head Wo Contrast  Result Date: 10/15/2020 CLINICAL DATA:  Pain after trauma EXAM: CT HEAD WITHOUT CONTRAST CT CERVICAL SPINE WITHOUT CONTRAST TECHNIQUE: Multidetector CT imaging of the head and cervical spine was performed following the standard protocol without intravenous contrast. Multiplanar CT image reconstructions of the cervical spine were also generated. COMPARISON:  CT scan of the head October 29, 2017. CT angio neck October 27, 2017. FINDINGS: CT HEAD FINDINGS  Brain: No subdural, epidural, or subarachnoid hemorrhage. No mass effect or midline shift. Ventricles and sulci are stable. Cerebellum, brainstem, and basal cisterns are normal. White matter changes are identified. No acute cortical ischemia or infarct identified. Vascular: Calcified atherosclerosis in the intracranial carotids. Skull: Normal. Negative for fracture or focal lesion. Sinuses/Orbits: The left maxillary and sphenoid sinuses are completely opacified, unchanged since March of 2019. Opacification of scattered left mastoid air cells is identified without associated fracture or rosea in. No other abnormalities. Other: None. CT CERVICAL SPINE FINDINGS Alignment: C1 articulates with the occipital condyles. C1 is somewhat displaced posteriorly relative to the remainder of the cervical spine. This is more pronounced compared to the 31-Oct-2017 study. Anterolisthesis of C3 versus C4 is stable since 2019. Several other mild listheses are identified, also likely degenerative in nature. Anterolisthesis of C7 versus T1 is stable. Skull base and vertebrae: There are fractures through the right and left aspects of the C1 anterior arch. There is a fracture through the odontoid process best seen on sagittal image 25. No other fractures are identified. Soft tissues and spinal canal: Calcified pannus associated with the C1 level. This was also present in March of 2019. No other soft tissue abnormalities are identified. Disc levels: Multilevel  degenerative changes. Facet degenerative changes. Upper chest: Negative. Other: No other abnormalities. IMPRESSION: 1. There is a fracture through the odontoid process as well as fracture through the anterior arch of C1. C1 is somewhat posteriorly displaced relative to the remainder of the cervical spine. This was at least partially present in March of 2019 but does appear somewhat increased. The canal remains patent. 2. Degenerative changes in the cervical spine. 3. No acute intracranial abnormalities. 4. Sinus disease as above. Findings called to Dr. Beecher Mcardle. Electronically Signed   By: Dorise Bullion III M.D   On: 10/15/2020 16:41   CT Cervical Spine Wo Contrast  Result Date: 10/15/2020 CLINICAL DATA:  Pain after trauma EXAM: CT HEAD WITHOUT CONTRAST CT CERVICAL SPINE WITHOUT CONTRAST TECHNIQUE: Multidetector CT imaging of the head and cervical spine was performed following the standard protocol without intravenous contrast. Multiplanar CT image reconstructions of the cervical spine were also generated. COMPARISON:  CT scan of the head October 29, 2017. CT angio neck 10/31/2017. FINDINGS: CT HEAD FINDINGS Brain: No subdural, epidural, or subarachnoid hemorrhage. No mass effect or midline shift. Ventricles and sulci are stable. Cerebellum, brainstem, and basal cisterns are normal. White matter changes are identified. No acute cortical ischemia or infarct identified. Vascular: Calcified atherosclerosis in the intracranial carotids. Skull: Normal. Negative for fracture or focal lesion. Sinuses/Orbits: The left maxillary and sphenoid sinuses are completely opacified, unchanged since March of 2019. Opacification of scattered left mastoid air cells is identified without associated fracture or rosea in. No other abnormalities. Other: None. CT CERVICAL SPINE FINDINGS Alignment: C1 articulates with the occipital condyles. C1 is somewhat displaced posteriorly relative to the remainder of the cervical spine. This is  more pronounced compared to the 2017-10-31 study. Anterolisthesis of C3 versus C4 is stable since 2019. Several other mild listheses are identified, also likely degenerative in nature. Anterolisthesis of C7 versus T1 is stable. Skull base and vertebrae: There are fractures through the right and left aspects of the C1 anterior arch. There is a fracture through the odontoid process best seen on sagittal image 25. No other fractures are identified. Soft tissues and spinal canal: Calcified pannus associated with the C1 level. This was also present in March  of 2019. No other soft tissue abnormalities are identified. Disc levels: Multilevel degenerative changes. Facet degenerative changes. Upper chest: Negative. Other: No other abnormalities. IMPRESSION: 1. There is a fracture through the odontoid process as well as fracture through the anterior arch of C1. C1 is somewhat posteriorly displaced relative to the remainder of the cervical spine. This was at least partially present in March of 2019 but does appear somewhat increased. The canal remains patent. 2. Degenerative changes in the cervical spine. 3. No acute intracranial abnormalities. 4. Sinus disease as above. Findings called to Dr. Beecher Mcardle. Electronically Signed   By: Dorise Bullion III M.D   On: 10/15/2020 16:41    Pending Labs Unresulted Labs (From admission, onward)          Start     Ordered   10/16/20 0500  Procalcitonin  Tomorrow morning,   R        10/15/20 2039   10/16/20 0500  Magnesium  Tomorrow morning,   R        10/15/20 2039   10/16/20 0500  Comprehensive metabolic panel  Tomorrow morning,   R        10/15/20 2039   10/16/20 0500  CBC  Tomorrow morning,   R        10/15/20 2039   10/15/20 2133  SARS CORONAVIRUS 2 (TAT 6-24 HRS) Nasopharyngeal Nasopharyngeal Swab  (Tier 3 - Symptomatic/asymptomatic with Precautions)  Once,   STAT       Question Answer Comment  Is this test for diagnosis or screening Diagnosis of ill patient    Symptomatic for COVID-19 as defined by CDC Unknown   Hospitalized for COVID-19 No   Admitted to ICU for COVID-19 No   Previously tested for COVID-19 No   Resident in a congregate (group) care setting No   Employed in healthcare setting No   Has patient completed COVID vaccination(s) (2 doses of Pfizer/Moderna 1 dose of [employer]) Unknown      10/15/20 2132   10/15/20 2019  Culture, blood (routine x 2)  BLOOD CULTURE X 2,   R (with STAT occurrences)      10/15/20 2018   10/15/20 1339  Urine culture  ONCE - STAT,   STAT        10/15/20 1339   Signed and Held  SARS CORONAVIRUS 2 (TAT 6-24 HRS) Nasopharyngeal Nasopharyngeal Swab  (Tier 3 - Symptomatic/asymptomatic with Precautions)  Once,   R       Question Answer Comment  Is this test for diagnosis or screening Screening   Symptomatic for COVID-19 as defined by CDC No   Hospitalized for COVID-19 No   Admitted to ICU for COVID-19 No   Previously tested for COVID-19 No   Resident in a congregate (group) care setting No   Employed in healthcare setting No   Has patient completed COVID vaccination(s) (2 doses of Pfizer/Moderna 1 dose of [employer]) Unknown      Signed and Held   Signed and Held  SARS CORONAVIRUS 2 (TAT 6-24 HRS) Nasopharyngeal Nasopharyngeal Swab  (COVID Labs)  Once,   R       Question Answer Comment  Is this test for diagnosis or screening Screening   Symptomatic for COVID-19 as defined by CDC No   Hospitalized for COVID-19 No   Admitted to ICU for COVID-19 No   Previously tested for COVID-19 No   Resident in a congregate (group) care setting No   Employed  in healthcare setting No   Has patient completed COVID vaccination(s) (2 doses of Pfizer/Moderna 1 dose of [employer]) Unknown   Pre-procedural testing Yes      Signed and Held          Vitals/Pain Today's Vitals   10/15/20 2100 10/15/20 2200 10/15/20 2300 10/16/20 0000  BP: 94/72 (!) 96/58 (!) 96/53 94/71  Pulse: 93 90 94 93  Resp:  (!) 22 (!) 22 (!) 22 19  Temp: (!) 96.2 F (35.7 C)   97.6 F (36.4 C)  TempSrc: Rectal   Oral  SpO2: 95% (!) 81% (!) 88% 96%  Weight:      Height:      PainSc:        Isolation Precautions Airborne and Contact precautions  Medications Medications  0.9 % NaCl with KCl 40 mEq / L  infusion (has no administration in time range)  acetaminophen (TYLENOL) tablet 650 mg (has no administration in time range)    Or  acetaminophen (TYLENOL) suppository 650 mg (has no administration in time range)  ondansetron (ZOFRAN) tablet 4 mg (has no administration in time range)    Or  ondansetron (ZOFRAN) injection 4 mg (has no administration in time range)  sodium chloride flush (NS) 0.9 % injection 3 mL (3 mLs Intravenous Given 10/15/20 2312)  ceFAZolin (ANCEF) IVPB 1 g/50 mL premix (1 g Intravenous New Bag/Given 10/15/20 2312)  vancomycin (VANCOCIN) IVPB 1000 mg/200 mL premix (0 mg Intravenous Stopped 10/15/20 2147)  ceFEPIme (MAXIPIME) 2 g in sodium chloride 0.9 % 100 mL IVPB (0 g Intravenous Stopped 10/15/20 1921)  magnesium sulfate IVPB 2 g 50 mL (0 g Intravenous Stopped 10/15/20 2035)  LORazepam (ATIVAN) injection 1 mg (1 mg Intravenous Given 10/15/20 1754)  thiamine (B-1) injection 100 mg (100 mg Intravenous Given 10/15/20 1759)  sodium chloride 0.9 % bolus 500 mL (0 mLs Intravenous Stopped 10/15/20 2158)    Mobility non-ambulatory

## 2020-10-16 NOTE — Progress Notes (Signed)
Notified Lab that ABG being sent for analysis. 

## 2020-10-17 ENCOUNTER — Inpatient Hospital Stay (HOSPITAL_COMMUNITY): Payer: Medicare Other

## 2020-10-17 DIAGNOSIS — R6520 Severe sepsis without septic shock: Secondary | ICD-10-CM | POA: Diagnosis not present

## 2020-10-17 DIAGNOSIS — A419 Sepsis, unspecified organism: Secondary | ICD-10-CM | POA: Diagnosis not present

## 2020-10-17 LAB — COMPREHENSIVE METABOLIC PANEL
ALT: 21 U/L (ref 0–44)
AST: 40 U/L (ref 15–41)
Albumin: 2.1 g/dL — ABNORMAL LOW (ref 3.5–5.0)
Alkaline Phosphatase: 91 U/L (ref 38–126)
Anion gap: 13 (ref 5–15)
BUN: 16 mg/dL (ref 8–23)
CO2: 17 mmol/L — ABNORMAL LOW (ref 22–32)
Calcium: 9.8 mg/dL (ref 8.9–10.3)
Chloride: 113 mmol/L — ABNORMAL HIGH (ref 98–111)
Creatinine, Ser: 0.65 mg/dL (ref 0.61–1.24)
GFR, Estimated: >60 mL/min (ref >60)
Glucose, Bld: 93 mg/dL (ref 70–99)
Potassium: 4.2 mmol/L (ref 3.5–5.1)
Sodium: 143 mmol/L (ref 135–145)
Total Bilirubin: 0.7 mg/dL (ref 0.3–1.2)
Total Protein: 4.9 g/dL — ABNORMAL LOW (ref 6.5–8.1)

## 2020-10-17 LAB — MAGNESIUM: Magnesium: 2 mg/dL (ref 1.7–2.4)

## 2020-10-17 LAB — URINE CULTURE

## 2020-10-17 LAB — CBC
HCT: 33.9 % — ABNORMAL LOW (ref 39.0–52.0)
Hemoglobin: 11.1 g/dL — ABNORMAL LOW (ref 13.0–17.0)
MCH: 33.1 pg (ref 26.0–34.0)
MCHC: 32.7 g/dL (ref 30.0–36.0)
MCV: 101.2 fL — ABNORMAL HIGH (ref 80.0–100.0)
Platelets: 278 10*3/uL (ref 150–400)
RBC: 3.35 MIL/uL — ABNORMAL LOW (ref 4.22–5.81)
RDW: 14.6 % (ref 11.5–15.5)
WBC: 11.5 10*3/uL — ABNORMAL HIGH (ref 4.0–10.5)
nRBC: 0 % (ref 0.0–0.2)

## 2020-10-17 LAB — PROCALCITONIN: Procalcitonin: <0.1 ng/mL

## 2020-10-17 LAB — MRSA PCR SCREENING: MRSA by PCR: NEGATIVE

## 2020-10-17 LAB — PHOSPHORUS: Phosphorus: 2.6 mg/dL (ref 2.5–4.6)

## 2020-10-17 LAB — T4, FREE: Free T4: 0.87 ng/dL (ref 0.61–1.12)

## 2020-10-17 MED ORDER — SODIUM CHLORIDE 3 % IN NEBU
4.0000 mL | INHALATION_SOLUTION | Freq: Every day | RESPIRATORY_TRACT | Status: AC
  Administered 2020-10-17 – 2020-10-20 (×3): 4 mL via RESPIRATORY_TRACT
  Filled 2020-10-17 (×3): qty 4

## 2020-10-17 MED ORDER — PANTOPRAZOLE SODIUM 40 MG IV SOLR
40.0000 mg | INTRAVENOUS | Status: DC
  Administered 2020-10-17 – 2020-10-19 (×3): 40 mg via INTRAVENOUS
  Filled 2020-10-17 (×3): qty 40

## 2020-10-17 MED ORDER — IPRATROPIUM-ALBUTEROL 0.5-2.5 (3) MG/3ML IN SOLN: 3.0000 mL | Freq: Four times a day (QID) | RESPIRATORY_TRACT | Status: DC

## 2020-10-17 MED ORDER — POLYVINYL ALCOHOL 1.4 % OP SOLN
2.0000 [drp] | OPHTHALMIC | Status: DC | PRN
  Filled 2020-10-17: qty 15

## 2020-10-17 MED ORDER — IPRATROPIUM-ALBUTEROL 0.5-2.5 (3) MG/3ML IN SOLN
3.0000 mL | Freq: Two times a day (BID) | RESPIRATORY_TRACT | Status: DC
  Administered 2020-10-17 – 2020-10-20 (×5): 3 mL via RESPIRATORY_TRACT
  Filled 2020-10-17 (×6): qty 3

## 2020-10-17 MED ORDER — LEVOTHYROXINE SODIUM 100 MCG/5ML IV SOLN: 25.0000 ug | Freq: Every day | INTRAVENOUS | Status: DC

## 2020-10-17 MED ORDER — ENOXAPARIN SODIUM 40 MG/0.4ML ~~LOC~~ SOLN
40.0000 mg | SUBCUTANEOUS | Status: DC
  Administered 2020-10-17 – 2020-10-18 (×2): 40 mg via SUBCUTANEOUS
  Filled 2020-10-17 (×3): qty 0.4

## 2020-10-17 NOTE — Evaluation (Signed)
Occupational Therapy Evaluation Patient Details Name: Ian [name] MRN: [id number] DOB: [birth year] Today's Date: 10/17/2020    History of Present Illness 87 y.o. male with medical history significant for history of CVA on Plavix, hypertension, hyperlipidemia, hypothyroidism, RBBB, alcohol use disorder, dementia who was brought to the ED via EMS for evaluation of confusion   Clinical Impression   Patient lives at home with spouse in two story house with 6 STE. Spouse providing hx due to patient's dementia. Per spouse, she assisted patient to his bedroom where he didn't it back far enough and "slid" to the floor "it wasn't really a fall." Spouse states she gave him blankets and pillows to stay on floor overnight, in AM neighbors weren't around to assist her with getting him up therefore called EMS services. At baseline, spouse has to provide supervision for all transfers, ambulation with rolling walker as well as assist with dressing, sponge bathing and toileting. Spouse states she has noticed a decline in patient's overall strength and mentation for past ~1 week. Currently patient needing min A for bed mobility with increased time and assist to scoot hips. First attempt standing patient needing heavy max A, poor to zero standing balance with strong posterior lean therefore returned to sitting. Second attempt bed height elevated, pt min A to stand and heavy mod due to posterior lean to pivot to recliner with walker. Poor eccentric control into chair. Recommend continued acute OT services to maximize patient global strength, endurance, balance in order to reduce caregiver burden and facilitate D/C to venue listed below.    Follow Up Recommendations  SNF;Other (comment) (vs HH and 24/7 A if family declines ST rehab)    Equipment Recommendations  None recommended by OT       Precautions / Restrictions Precautions Precautions: Fall;Cervical Precaution Booklet Issued: No Required Braces or Orthoses:  Cervical Brace Cervical Brace: Hard collar;Other (comment) (not specified in orders, presume at all times) Restrictions Weight Bearing Restrictions: No      Mobility Bed Mobility Overal bed mobility: Needs Assistance Bed Mobility: Supine to Sit     Supine to sit: Min assist;HOB elevated     General bed mobility comments: increased time, min A to scoot hips out to EOB    Transfers Overall transfer level: Needs assistance Equipment used: Rolling walker (2 wheeled) Transfers: Sit to/from [facility] Sit to Stand: Min assist;From elevated surface Stand pivot transfers: Mod assist       General transfer comment: please see toilet transfer in ADL section for details; strong posterior lean in standing and fear of falling; high fall risk    Balance Overall balance assessment: History of Falls;Needs assistance Sitting-balance support: Feet supported Sitting balance-Leahy Scale: Fair     Standing balance support: Bilateral upper extremity supported Standing balance-Leahy Scale: Poor Standing balance comment: reliant on UE support and mod A                           ADL either performed or assessed with clinical judgement   ADL Overall ADL's : Needs assistance/impaired     Grooming: Minimal assistance;Sitting   Upper Body Bathing: Minimal assistance;Sitting   Lower Body Bathing: Maximal assistance;Sitting/lateral leans   Upper Body Dressing : Minimal assistance;Sitting   Lower Body Dressing: Total assistance;Bed level Lower Body Dressing Details (indicate cue type and reason): to don socks Toilet Transfer: Moderate assistance;Stand-pivot;Cueing for safety;Cueing for sequencing;BSC;RW Toilet Transfer Details (indicate cue type and reason): first attempt max  A to power up to standing with strong posterior lean, ultimately had to return to sitting. with bed height elevated patient able to power up to standing with min A, heavy mod A due to poor  standing balance, fear of falling and cues to sequence taking small steps to recliner. Poor eccentric control into chair Toileting- Clothing Manipulation and Hygiene: Total assistance       Functional mobility during ADLs: Moderate assistance;Cueing for safety;Cueing for sequencing;Rolling walker General ADL Comments: patient's spouse assists with ADLs at baseline, however patient needing significant assistance to power up to standing, for standing balance and functional transfers. is high fall risk                  Pertinent Vitals/Pain Pain Assessment: Faces Faces Pain Scale: No hurt     Hand Dominance Right   Extremity/Trunk Assessment Upper Extremity Assessment Upper Extremity Assessment: Generalized weakness   Lower Extremity Assessment Lower Extremity Assessment: Defer to PT evaluation   Cervical / Trunk Assessment Cervical / Trunk Assessment: Kyphotic   Communication Communication Communication: Expressive difficulties;Other (comment) (very wet gargling speech)   Cognition Arousal/Alertness: Awake/alert Behavior During Therapy: WFL for tasks assessed/performed Overall Cognitive Status: History of cognitive impairments - at baseline                                 General Comments: once awak patient was cooperative and following OT's directions   General Comments  patient maintain mid 90s on RA with activity            Home Living Family/patient expects to be discharged to:: Private residence Living Arrangements: Spouse/significant other Available Help at Discharge: Family Type of Home: House Home Access: Stairs to enter [employer] of Steps: back 2 steps then porch (no rails) main entrance has rails 6 steps.   Home Layout: Two level;Able to live on main level with bedroom/bathroom     Bathroom Shower/Tub: Tub/shower unit (downstairs. upstairs has walk in)   [employer]: Standard     Home Equipment: Gilford Rile - 2  wheels;Cane - single point;Bedside commode   Additional Comments: sleeps primarily in recliner      Prior Functioning/Environment Level of Independence: Needs assistance  Gait / Transfers Assistance Needed: ambulates with walker and spouse proving close assist. spouse also provides assist to power up to standing ADL's / Homemaking Assistance Needed: spouse assists with dressing, sponge bathing, and ambulating to bathroom   Comments: per case manager have children in the area that also provide support to spouse to assist with patient ADLs        OT Problem List: Decreased activity tolerance;Impaired balance (sitting and/or standing);Decreased safety awareness      OT Treatment/Interventions: Self-care/ADL training;Therapeutic exercise;Therapeutic activities;Patient/family education;Balance training    OT Goals(Current goals can be found in the care plan section) Acute Rehab OT Goals Patient Stated Goal: agreeable to get to chair OT Goal Formulation: With patient/family Time For Goal Achievement: 10/31/20 Potential to Achieve Goals: Fair  OT Frequency: Min 2X/week    AM-PAC OT "6 Clicks" Daily Activity     Outcome Measure Help from another person eating meals?: Total (NPO) Help from another person taking care of personal grooming?: A Little Help from another person toileting, which includes using toliet, bedpan, or urinal?: A Lot Help from another person bathing (including washing, rinsing, drying)?: A Lot Help from another person to put on and taking off regular upper  body clothing?: A Little Help from another person to put on and taking off regular lower body clothing?: Total 6 Click Score: 12   End of Session Equipment Utilized During Treatment: Rolling walker;Cervical collar Nurse Communication: Mobility status;Other (comment) (use of stedy back to bed d/t fear of falling; chair alarm needing batteries)  Activity Tolerance: Patient tolerated treatment well Patient left: in  chair;with call bell/phone within reach;with chair alarm set;with family/visitor present;Other (comment) (B mits donned)  OT Visit Diagnosis: Unsteadiness on feet (R26.81);History of falling (Z91.81)                Time: 7341-9379 OT Time Calculation (min): 40 min Charges:  OT General Charges $OT Visit: 1 Visit OT Evaluation $OT Eval Moderate Complexity: 1 Mod OT Treatments $Self Care/Home Management : 23-37 mins  Delbert Phenix OT OT pager: [phone]  Rosemary Holms 10/17/2020, 1:17 PM

## 2020-10-17 NOTE — Progress Notes (Signed)
PT Cancellation Note  Patient Details Name: Ian [name] MRN: [id number] DOB: [birth year]   Cancelled Treatment:     Pt out of room down in x ray . Will assess pt tomorrow from PT standpoint.   Clide Dales 10/17/2020, 4:47 PM

## 2020-10-17 NOTE — Progress Notes (Signed)
Back throat wet rattling sounds, mouth care done.  Yankauer used and suction moderate thick yellow secretions. RT Jessica notified for possible neb treatment.

## 2020-10-17 NOTE — Progress Notes (Signed)
  Speech Language Pathology Treatment: Dysphagia  Patient Details Name: Ian [name] MRN: [id number] DOB: [birth year] Today's Date: 10/17/2020 Time: 1430-1501 SLP Time Calculation (min) (ACUTE ONLY): 31 min  Assessment / Plan / Recommendation Clinical Impression  Pt seen to assess readiness for po intake.  Pt is alert but does not consistently follow directions.  His oral cavity is xerostomic with viscous secretions posterior pharynx.  His wife is present and admits pt coughs and expectorates secretions at home.  She does not eat with pt as she states he eats in "another room".  Pt denies having problems swallowing at home.    Provided oral care with oral suction and water.  Tsp of water provided initially without indication of aspiration.  He was then provided with water via straw.  Swallow appeared very discoordinated, with pt's eyes opening wide and appearance of immediate respiratory distress - breath hold and coughing.  Mr Schoolfield did not appear concerned about dysphagia and aspiration event - causing SLP concern re: potential premorbid aspiration.    Discussed with pt's wife Ian [name] that feeding tubes do not change outcomes for pt's over the age of 27 with cognitive deficits and she has advised she does not want feeding tube for this pt.  Reviewed potential gross dysphagia being diagnosed via MBS.  Ian [name] reported understanding and goal is to determine if any consistency or strategy may be tolerated.   Invited wife to attend MBS but she advised she could not stay and reported pt would participate without her presence.       HPI HPI: Patient is a 87 y.o. male with PMH: CVA (2019), HTN, HLD, hypothyroidism, alcohol use disorder, dementia, who presented to ED via EMS for evaluation of confusion. Per wife's report at time of admission, patient had fall 2-3 days prior resulting in right shin injury. She found patient down on floor presumed to be from unwitnessed fall. Head CT did not reveal any acute  intracranial abnormalities. CXR revealed chronic airspace opacities at lung bases favoring atelectasis or scarring.      SLP Plan  MBS (today)       Recommendations  Diet recommendations: NPO Medication Administration: Via alternative means                Oral Care Recommendations: Oral care QID Plan: MBS (today)       GO               Kathleen Lime, MS [facility] SLP Acute Rehab Services Office [phone] Pager [phone]   Macario Golds 10/17/2020, 3:37 PM

## 2020-10-17 NOTE — Progress Notes (Signed)
PROGRESS NOTE  Ian [name] NWG:[id number] DOB: October 07, 1925 DOA: 10/15/2020 PCP: Bernerd Limbo, MD  HPI/Recap of past 24 hours: Ian [name] is a 87 y.o. male with medical history significant for prior CVA on Plavix, essential hypertension, hyperlipidemia, hypothyroidism, RBBB, alcohol use disorder, dementia who was brought to the ED via EMS from home for evaluation of confusion.   Per wife, patient has chronic right foot drop.  He uses a walker to ambulate but has had several falls recently.  He had a fall 2 to 3 days ago resulting in injury to his left shin.  Today he was found down at home by his wife presumed due to an unwitnessed fall.  He had apparently stayed on the floor overnight before he was found.  Wife was unable to get him up and he was noted to be more confused than baseline therefore she called EMS for further assistance.  Per ED documentation, EMS reported that patient's room was significantly disheveled/unkempt with feces and body fluids present.  Rest of the house including wife's separate room was clean and tidy.  Per patient's wife, he has been taking his medications regularly.  He does drink alcohol about 1 beer and 1 cocktail per day but denies further heavy alcohol use.  She says he has not had a drink in the last 2-3 days prior to admission due to feeling unwell.  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  Work-up also revealed severe sepsis secondary to left lower extremity cellulitis.  Acute metabolic encephalopathy likely from sepsis.  10/17/20: Seen and examined at his bedside.  His wife was present in the room.  She changed his CODE STATUS to DNR.  He is more alert today and answering simple questions.  Denies having any pain.  He is on rigid cervical collar and on 2 L nasal cannula with O2 saturation in the mid  90s.  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  His wife is agreeable. Appreciate TOC's assistance with SNF placement.  Severe sepsis secondary to left lower extremity cellulitis. Presented with hypothermia, tachypnea, leukocytosis, tachycardia, lactic acidosis.  Evidence of soft tissue skin infection/cellulitis in left lower extremity. Continue cefazolin. Obtain MRSA screen to rule out MRSA infection. Continue gentle IV fluid hydration [id number] at 30cc/hr x2 days, judicious on IV fluid due to third spacing on exam. Continue local wound care as recommended by wound care specialist.  Improving acute metabolic encephalopathy likely multifactorial secondary to sepsis, acute illness. History of mild dementia per his wife at bedside. CT head no acute intracranial abnormalities. UA positive for pyuria, urine culture multiple species present. Blood cultures x2 negative to date.  Bilateral lower extremity edema likely contributed by hypoalbuminemia 4+ pitting edema lower extremities bilaterally Presented with elevated BNP 2D echo ordered on 10/16/2020, but not completed due to the patient  not cooperating. Bilateral lower extremity duplex ultrasound ordered on 10/16/2020 however, not completed, due to the patient not cooperating. Elevate extremities.  Suspected dysphagia with concern for aspiration Speech therapist consulted N.p.o. until passes  swallow evaluation by speech therapist. Continue aspiration precautions. Continue oral suctioning as needed.  Appreciate RT's assistance.  Bibasilar atelectasis/scarring on chest x-ray Personally reviewed chest x-ray done on admission and on 10/16/2020 Incentive spirometer Out of bed to chair as tolerated  Resolved elevated liver chemistries likely secondary to alcohol abuse Avoid hepatotoxic agents Monitor.  Resolved hypokalemia, post repletion. Repleted intravenously. Serum potassium 4.2 Magnesium 2.0.  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   Poor functional status Dementia Alcohol abuse Palliative care team consulted to assist with establishing goals of care. Patient is currently DNR.    Code Status: DNR.  Family Communication: Updated his wife at bedside on 10/16/2020 and 10/17/2020.  Disposition Plan: Likely will discharge to SNF.   Consultants:  Neurosurgery.  Procedures:  None.  Antimicrobials:  Ancef.  DVT prophylaxis:  SCDs/start subcu Lovenox daily on 10/17/2020.  Status is: Inpatient    Dispo:  Patient From: Home  Planned Disposition: [place]  Expected discharge date: 10/20/2020  Medically stable for discharge: No, ongoing management of severe sepsis secondary to left lower extremity cellulitis and New type II dens fracture and C1 arch fractures.            Objective: Vitals:   10/16/20 2030 10/17/20 0303 10/17/20 0500 10/17/20 1343  BP: 112/70 115/75  133/90  Pulse: 82 81  72  Resp: 18 (!) 21  16  Temp: 98.3 F (36.8 C) 98.6 F (37 C)  (!) 97.2 F (36.2 C)  TempSrc:  Axillary    SpO2: 92% 94%  92%  Weight:   80 kg   Height:        Intake/Output Summary (Last 24 hours) at 10/17/2020 1436 Last data filed at  10/17/2020 5093 Gross per 24 hour  Intake 1297.39 ml  Output -  Net 1297.39 ml   Filed Weights   10/15/20 1328 10/17/20 0500  Weight: 81.6 kg 80 kg    Exam:  . General: 87 y.o. year-old male frail-appearing in no acute distress.  Rigid cervical collar in place.  Alert and oriented x1.   . Cardiovascular: Regular rate and rhythm with no rubs or gallops.  Marland Kitchen Respiratory: Mild rales at bases no wheezing noted.  Poor inspiratory effort.   . Abdomen: Soft nontender normal bowel sounds present.   . Musculoskeletal: 4+ pitting edema in lower extremities bilaterally left greater than right.  Left lower extremity with warmth, erythema, edema, tenderness.  . Skin: Erythema, warmth, edema involving left lower extremity. Marland Kitchen Psychiatry: Mood is appropriate for condition and setting.   Data Reviewed: CBC: Recent Labs  Lab 10/15/20 1415 10/15/20 1430 10/16/20 0335 10/17/20 0841  WBC 15.6*  --  12.6* 11.5*  NEUTROABS 13.4*  --   --   --   HGB 11.3* 12.2* 10.6* 11.1*  HCT 34.0* 36.0* 33.0* 33.9*  MCV 99.7  --  102.8* 101.2*  PLT 316  --  270 267   Basic Metabolic Panel: Recent Labs  Lab 10/15/20 1415 10/15/20 1430 10/16/20 0335 10/17/20 0841  NA 138 140 142 143  K 3.4* 3.3* 3.8 4.2  CL 106  106 112* 113*  CO2 22  --  21* 17*  GLUCOSE 88 82 78 93  BUN 23 20 22 16   CREATININE 0.88 0.70 0.79 0.65  CALCIUM 9.6  --  9.8 9.8  MG 1.5*  --  2.0 2.0  PHOS  --   --   --  2.6   GFR: Estimated Creatinine Clearance: 62.8 mL/min (by C-G formula based on SCr of 0.65 mg/dL). Liver Function Tests: Recent Labs  Lab 10/15/20 1415 10/16/20 0335 10/17/20 0841  AST 52* 53* 40  ALT 26 27 21   ALKPHOS 96 88 91  BILITOT 1.5* 1.4* 0.7  PROT 5.2* 5.0* 4.9*  ALBUMIN 2.4* 2.2* 2.1*   No results for input(s): LIPASE, AMYLASE in the last 168 hours. No results for input(s): AMMONIA in the last 168 hours. Coagulation Profile: Recent Labs  Lab 10/15/20 1415  INR 1.1   Cardiac Enzymes: Recent  Labs  Lab 10/15/20 1415  CKTOTAL 363   BNP (last 3 results) No results for input(s): PROBNP in the last 8760 hours. HbA1C: No results for input(s): HGBA1C in the last 72 hours. CBG: No results for input(s): GLUCAP in the last 168 hours. Lipid Profile: No results for input(s): CHOL, HDL, LDLCALC, TRIG, CHOLHDL, LDLDIRECT in the last 72 hours. Thyroid Function Tests: Recent Labs    10/15/20 2043 10/17/20 0841  TSH 6.308*  --   FREET4  --  0.87   Anemia Panel: No results for input(s): VITAMINB12, FOLATE, FERRITIN, TIBC, IRON, RETICCTPCT in the last 72 hours. Urine analysis:    Component Value Date/Time   COLORURINE YELLOW 10/15/2020 2043   APPEARANCEUR HAZY (A) 10/15/2020 2043   LABSPEC 1.019 10/15/2020 2043   PHURINE 5.0 10/15/2020 2043   GLUCOSEU NEGATIVE 10/15/2020 2043   HGBUR NEGATIVE 10/15/2020 2043   BILIRUBINUR NEGATIVE 10/15/2020 2043   KETONESUR NEGATIVE 10/15/2020 2043   PROTEINUR NEGATIVE 10/15/2020 2043   UROBILINOGEN 1.0 01/25/2010 1355   NITRITE NEGATIVE 10/15/2020 2043   LEUKOCYTESUR MODERATE (A) 10/15/2020 2043   Sepsis Labs: @LABRCNTIP (procalcitonin:4,lacticidven:4)  ) Recent Results (from the past 240 hour(s))  Culture, blood (routine x 2)     Status: None (Preliminary result)   Collection Time: 10/15/20  2:35 PM   Specimen: BLOOD LEFT FOREARM  Result Value Ref Range Status   Specimen Description   Final    BLOOD LEFT FOREARM Performed at [facility] [address], [place], [place] [address]    Special Requests   Final    BOTTLES DRAWN AEROBIC AND ANAEROBIC Blood Culture results may not be optimal due to an inadequate volume of blood received in culture bottles Performed at [facility] [address], [place], [place] [address]    Culture   Final    NO GROWTH 1 DAY Performed at [facility], [place] [address], [place], [place] [address]    Report Status PENDING  Incomplete  Culture, blood (routine x 2)      Status: None (Preliminary result)   Collection Time: 10/15/20  8:40 PM   Specimen: BLOOD  Result Value Ref Range Status   Specimen Description   Final    BLOOD RIGHT ANTECUBITAL Performed at [facility] [address] [place], [place] [address]    Special Requests   Final    BOTTLES DRAWN AEROBIC AND ANAEROBIC Blood Culture adequate volume Performed at [facility] [address], [place], [place] [address]    Culture   Final  NO GROWTH 1 DAY Performed at [facility], [place] [address] [place], [place] [address]    Report Status PENDING  Incomplete  Urine culture     Status: Abnormal   Collection Time: 10/15/20  8:43 PM   Specimen: Urine, Random  Result Value Ref Range Status   Specimen Description   Final    URINE, RANDOM Performed at [facility] [address], [place], [place] [address]    Special Requests   Final    NONE Performed at [facility], [place] [address], [place], [place] [address]    Culture MULTIPLE SPECIES PRESENT, SUGGEST RECOLLECTION (A)  Final   Report Status 10/17/2020 FINAL  Final  SARS CORONAVIRUS 2 (TAT 6-24 HRS) Nasopharyngeal Nasopharyngeal Swab     Status: None   Collection Time: 10/15/20  9:33 PM   Specimen: Nasopharyngeal Swab  Result Value Ref Range Status   SARS Coronavirus 2 NEGATIVE NEGATIVE Final    Comment: (NOTE) SARS-CoV-2 target nucleic acids are NOT DETECTED.  The SARS-CoV-2 RNA is generally detectable in upper and lower respiratory specimens during the acute phase of infection. Negative results do not preclude SARS-CoV-2 infection, do not rule out co-infections with other pathogens, and should not be used as the sole basis for treatment or other patient management decisions. Negative results must be combined with clinical observations, patient history, and epidemiological information. The expected result is Negative.  Fact Sheet for  Patients: SugarRoll.be  Fact Sheet for Healthcare Providers: https://www.woods-mathews.com/  This test is not yet approved or cleared by the [place] FDA and  has been authorized for detection and/or diagnosis of SARS-CoV-2 by FDA under an Emergency Use Authorization (EUA). This EUA will remain  in effect (meaning this test can be used) for the duration of the COVID-19 declaration under Se ction 564(b)(1) of the Act, 21 U.S.C. section 360bbb-3(b)(1), unless the authorization is terminated or revoked sooner.  Performed at [facility], [place] [address], [place], [place] [address]       Studies: DG CHEST PORT 1 VIEW  Result Date: 10/16/2020 CLINICAL DATA:  87 year old male with hypoxia. EXAM: PORTABLE CHEST 1 VIEW COMPARISON:  Chest radiograph dated 10/15/2020. FINDINGS: Mild cardiomegaly with mild vascular congestion. Bibasilar streaky densities may represent atelectasis or infiltrate. There is mild eventration of the right hemidiaphragm. No large pleural effusion. No pneumothorax. Atherosclerotic calcification of the aorta. No acute osseous pathology. Surgical clips over the upper mediastinum as before. IMPRESSION: 1. Mild cardiomegaly with mild central vascular congestion. 2. Bibasilar atelectasis/scarring versus developing infiltrate. Electronically Signed   By: Anner Crete M.D.   On: 10/16/2020 17:08    Scheduled Meds: . [START ON 10/20/2020] levothyroxine  25 mcg Intravenous Daily  . pantoprazole (PROTONIX) IV  40 mg Intravenous Q24H  . sodium chloride flush  3 mL Intravenous Q12H    Continuous Infusions: .  ceFAZolin (ANCEF) IV 1 g (10/17/20 1402)  . dextrose 5% lactated ringers with KCl 20 mEq/L 30 mL/hr at 10/16/20 2130     LOS: 2 days     Kayleen Memos, MD [facility] Pager [phone]  If 7PM-7AM, please contact night-coverage [email or web address] Password [facility] 10/17/2020, 2:36 PM

## 2020-10-17 NOTE — Progress Notes (Signed)
Modified Barium Swallow Progress Note  Patient Details  Name: Quran Vasco MRN: [id number] Date of Birth: 12-17-1925  Today's Date: 10/17/2020  Modified Barium Swallow completed.  Full report located under Chart Review in the Imaging Section.  Brief recommendations include the following:  Clinical Impression  Mr Keenum presents with moderately severe oropharyngeal dysphagia with sensorimotor deficits.  He was tested with hard cervical collar in place per neurosurgery due to his new fracture to anterior arch of C1.  Oropharyngeal musculature weakness results in impaired oral control with delayed oral transiting due to lingual pumping A-P, decreased propulsion.  Delay was most notable with solids/puree - which pt required 12 seconds with excessive lingual pumping to transit.  Pharyngeal swallow was weak impairing epiglottic deflection, laryngeal elevation, pharyngeal contraction resulting in retention *at vallecular region more than pyriform sinus.  Cues for "strong swallow" clinically helpful to improve muscle recruitment however doubtful this will be replicated consistently during meals and suspect pt will fatigue.    Cues to "[facility]" were effective to clear large amount of vallecular and tongue base retention that pt does not sense. Pt actually stated "I hock all of the time" to this SLP - suspected some component of chronic dysphagia.  Fortunately, he can clear up to 80% of vallecular retention with great effort - and suspect this has been vital in protecting his airway.   Fatigue factor noted as when testing concluded pt continued with barium retention despite efforts to clear.    His pharyngeal swallow is also delayed with puree/cracker mixture barium aggregating in vallecular space prior to swallow trigger. Once during testing,  pt stated "OK girls" - indicating he had swallowed when pudding was retained at tongue base and vallecular space without pt swallowing.   SLP provided pt with necter liquid  to facilitate swallow.    Given pt's decreased oral control and overt aspiration observed with thin water at bedside, recommend nectar liquids -full liquids/nectar diet initially with strategies to mitigate his risk. Advise he be allowed ice chips and tsp amounts of thin water between meals after oral care.  Highly advise consider a palliative consult for this patient given his deconditioning, current illness, level of dysphagia with concern for meeting nutritional needs and aspiration risk.  Pt admits his swallowing ability is more impaired that baseline.     SLP had explained to [name], pt's wife, that goal of testing is to determine if any consistency and strategy may mitigate aspiration but not prevent it.  Wife reports not desire for feeding tube.  Will follow for treatment, family education and readiness for dietary advancement.  Pt's wife was not present during evaluation but was invited to attend.   Swallow Evaluation Recommendations       SLP Diet Recommendations: Nectar thick liquid;Free water protocol after oral care (full liquid, nectar thick)   Liquid Administration via: Cup   Medication Administration: Crushed with puree   Supervision: Full supervision/cueing for compensatory strategies   Compensations: Slow rate;Small sips/bites;Multiple dry swallows after each bite/sip;Other (Comment) ("hock" and expectorate after few bites/sips to clear retention from throat)   Postural Changes: Seated upright at 90 degrees;Remain semi-upright after after feeds/meals (Comment)   Oral Care Recommendations: Oral care BID      Kathleen Lime, MS [facility] SLP Acute Rehab Services Office [phone] Pager [phone]   Macario Golds 10/17/2020,5:29 PM

## 2020-10-17 NOTE — Progress Notes (Signed)
     Referral received for Ian [name] :goals of care discussion. Chart reviewed and updates received from RN. Patient is unable to engage appropriately in discussions. Attempted to contact patient's wife, Ian [name] on multiple occassions today. Unable to reach. Voicemail left with contact information given.   PMT will re-attempt to contact family at a later time/date. Detailed note and recommendations to follow once GOC has been completed.   Thank you for your referral and allowing PMT to assist in Mr.Ian [name]'s care.   Alda Lea, AGPCNP-BC Palliative Medicine Team  Phone: [phone] Pager: [phone] Amion: N. [place]   NO CHARGE

## 2020-10-18 DIAGNOSIS — F1010 Alcohol abuse, uncomplicated: Secondary | ICD-10-CM

## 2020-10-18 DIAGNOSIS — L03116 Cellulitis of left lower limb: Secondary | ICD-10-CM

## 2020-10-18 DIAGNOSIS — I10 Essential (primary) hypertension: Secondary | ICD-10-CM | POA: Diagnosis not present

## 2020-10-18 DIAGNOSIS — Z7189 Other specified counseling: Secondary | ICD-10-CM

## 2020-10-18 DIAGNOSIS — F0390 Unspecified dementia without behavioral disturbance: Secondary | ICD-10-CM | POA: Diagnosis not present

## 2020-10-18 DIAGNOSIS — Z515 Encounter for palliative care: Secondary | ICD-10-CM

## 2020-10-18 DIAGNOSIS — Z66 Do not resuscitate: Secondary | ICD-10-CM

## 2020-10-18 DIAGNOSIS — R6520 Severe sepsis without septic shock: Secondary | ICD-10-CM | POA: Diagnosis not present

## 2020-10-18 DIAGNOSIS — A419 Sepsis, unspecified organism: Secondary | ICD-10-CM | POA: Diagnosis not present

## 2020-10-18 DIAGNOSIS — Z8673 Personal history of transient ischemic attack (TIA), and cerebral infarction without residual deficits: Secondary | ICD-10-CM

## 2020-10-18 LAB — CBC
HCT: 33.5 % — ABNORMAL LOW (ref 39.0–52.0)
Hemoglobin: 11 g/dL — ABNORMAL LOW (ref 13.0–17.0)
MCH: 32.9 pg (ref 26.0–34.0)
MCHC: 32.8 g/dL (ref 30.0–36.0)
MCV: 100.3 fL — ABNORMAL HIGH (ref 80.0–100.0)
Platelets: 247 10*3/uL (ref 150–400)
RBC: 3.34 MIL/uL — ABNORMAL LOW (ref 4.22–5.81)
RDW: 14.8 % (ref 11.5–15.5)
WBC: 8.9 10*3/uL (ref 4.0–10.5)
nRBC: 0 % (ref 0.0–0.2)

## 2020-10-18 LAB — COMPREHENSIVE METABOLIC PANEL
ALT: 20 U/L (ref 0–44)
AST: 35 U/L (ref 15–41)
Albumin: 2 g/dL — ABNORMAL LOW (ref 3.5–5.0)
Alkaline Phosphatase: 87 U/L (ref 38–126)
Anion gap: 8 (ref 5–15)
BUN: 16 mg/dL (ref 8–23)
CO2: 21 mmol/L — ABNORMAL LOW (ref 22–32)
Calcium: 10.1 mg/dL (ref 8.9–10.3)
Chloride: 114 mmol/L — ABNORMAL HIGH (ref 98–111)
Creatinine, Ser: 0.66 mg/dL (ref 0.61–1.24)
GFR, Estimated: >60 mL/min (ref >60)
Glucose, Bld: 101 mg/dL — ABNORMAL HIGH (ref 70–99)
Potassium: 3.8 mmol/L (ref 3.5–5.1)
Sodium: 143 mmol/L (ref 135–145)
Total Bilirubin: 0.8 mg/dL (ref 0.3–1.2)
Total Protein: 4.7 g/dL — ABNORMAL LOW (ref 6.5–8.1)

## 2020-10-18 MED ORDER — FENTANYL CITRATE (PF) 100 MCG/2ML IJ SOLN
25.0000 ug | Freq: Once | INTRAMUSCULAR | Status: AC
  Administered 2020-10-18: 25 ug via INTRAVENOUS
  Filled 2020-10-18: qty 2

## 2020-10-18 MED ORDER — SODIUM CHLORIDE 0.9 % IV SOLN
2.0000 g | Freq: Three times a day (TID) | INTRAVENOUS | Status: DC
  Administered 2020-10-18 – 2020-10-19 (×5): 2 g via INTRAVENOUS
  Filled 2020-10-18 (×6): qty 2

## 2020-10-18 MED ORDER — RESOURCE THICKENUP CLEAR PO POWD
ORAL | Status: DC | PRN
  Filled 2020-10-18: qty 125

## 2020-10-18 MED ORDER — SODIUM CHLORIDE 0.9 % IV SOLN
INTRAVENOUS | Status: DC | PRN
  Administered 2020-10-18: 250 mL via INTRAVENOUS

## 2020-10-18 MED ORDER — MELATONIN 3 MG PO TABS
3.0000 mg | ORAL_TABLET | Freq: Once | ORAL | Status: AC
  Administered 2020-10-18: 3 mg via ORAL
  Filled 2020-10-18: qty 1

## 2020-10-18 NOTE — NC FL2 (Signed)
[place] LEVEL OF CARE SCREENING TOOL     IDENTIFICATION  Patient Name: Ian [name] Birthdate: 02/21/1926 Sex: male Admission Date (Current Location): 10/15/2020  [facility] and [place] Number:  Herbalist and Address:  [facility],  [place] [address], [place]      Provider Number: [id number]  Attending Physician Name and Address:  Nita Sells, MD  Relative Name and Phone Number:  Jiyan, Walkowski [facility])   [phone]    Current Level of Care: Hospital Recommended Level of Care: [place] Prior Approval Number:    Date Approved/Denied:   PASRR Number: [id number]  Discharge Plan: SNF    Current Diagnoses: Patient Active Problem List   Diagnosis Date Noted  . Fracture of anterior arch of C1 ([place]) 10/15/2020  . Severe sepsis ([place]) 10/15/2020  . Cellulitis of left lower extremity 10/15/2020  . Acute metabolic encephalopathy 42/59/5638  . Anemia 02/12/2018  . Duodenal ulcer 02/12/2018  . History of stroke 02/12/2018  . ETOH abuse 02/12/2018  . Right foot drop 02/12/2018  . Dyslipidemia 11/16/2017  . GI bleed due to NSAIDs 11/09/2017  . Melena 11/09/2017  . Acute blood loss anemia 11/09/2017  . GI bleed 11/09/2017  . Peptic ulcer disease   . Dysphagia, post-stroke   . Essential hypertension   . Embolic cerebral infarction ([place]) 10/29/2017  . Dementia without behavioral disturbance ([place])   . Benign essential HTN   . Diastolic dysfunction   . Hypothyroidism   . Hypokalemia   . Altered mental status   . Pressure injury of skin 10/26/2017  . Loss of weight 10/26/2017  . Elevated troponin I level 10/26/2017  . Leukocytosis 10/26/2017  . Multiple leg contusions, unspecified laterality, sequela 10/26/2017  . Cognitive impairment 10/26/2017  . Scalp hematoma, subsequent encounter 10/26/2017  . Head injury 10/26/2017  . Fall at home, sequela 10/26/2017  . Right BBB/left ant fasc block  10/26/2017  . Acute confusional state 10/26/2017  . Dizziness and giddiness 10/26/2017  . Cerebral infarction [facility]), right occipital lobe 10/26/2017  . Elevated AST (SGOT) 10/26/2017  . Shoulder injury, right, subsequent encounter 10/26/2017  . Paranasal sinus disease 10/26/2017  . Maxillary sinusitis 10/26/2017  . Ethmoid sinusitis 10/26/2017  . Sphenoid sinusitis 10/26/2017  . Rhabdomyolysis 10/25/2017  . Neuropathy 03/31/2013    Orientation RESPIRATION BLADDER Height & Weight     Self  Normal External catheter Weight: 84.6 kg Height:  5\' 11"  (180.3 cm)  BEHAVIORAL SYMPTOMS/MOOD NEUROLOGICAL BOWEL NUTRITION STATUS   (none)  (none) Incontinent Diet (see d/c summary)  AMBULATORY STATUS COMMUNICATION OF NEEDS Skin   Extensive Assist Verbally Other (Comment) (Wounds: L pretibial, Buttocks medial, Vertebral column)                       Personal Care Assistance Level of Assistance  Bathing,Feeding,Dressing Bathing Assistance: Maximum assistance Feeding assistance: Maximum assistance Dressing Assistance: Maximum assistance     Functional Limitations Info  Sight,Hearing,Speech Sight Info: Adequate Hearing Info: Adequate Speech Info: Adequate    SPECIAL CARE FACTORS FREQUENCY  PT (By licensed PT)   Palliative Care     PT Frequency: 5X/W              Contractures Contractures Info: Not present    Additional Factors Info  Code Status,Allergies Code Status Info: DNR Allergies Info: NKA           Current Medications (10/18/2020):  This is the current hospital active  medication list Current Facility-Administered Medications  Medication Dose Route Frequency Provider Last Rate Last Admin  . acetaminophen (TYLENOL) tablet 650 mg  650 mg Oral Q6H PRN Lenore Cordia, MD       Or  . acetaminophen (TYLENOL) suppository 650 mg  650 mg Rectal Q6H PRN Zada Finders R, MD      . ceFEPIme (MAXIPIME) 2 g in sodium chloride 0.9 % 100 mL IVPB  2 g Intravenous Q8H Adrian Saran, [facility]      . dextrose 5% in lactated ringers with KCl 20 mEq/L infusion   Intravenous Continuous Kayleen Memos, DO 30 mL/hr at 10/17/20 2242 New Bag at 10/17/20 2242  . enoxaparin (LOVENOX) injection 40 mg  40 mg Subcutaneous Q24H Irene Pap N, DO   40 mg at 10/17/20 2205  . ipratropium-albuterol (DUONEB) 0.5-2.5 (3) MG/3ML nebulizer solution 3 mL  3 mL Nebulization BID Irene Pap N, DO   3 mL at 10/17/20 1931  . [START ON 10/20/2020] levothyroxine (SYNTHROID, LEVOTHROID) injection 25 mcg  25 mcg Intravenous Daily Hall, Carole N, DO      . ondansetron (ZOFRAN) tablet 4 mg  4 mg Oral Q6H PRN Lenore Cordia, MD       Or  . ondansetron (ZOFRAN) injection 4 mg  4 mg Intravenous Q6H PRN Zada Finders R, MD      . pantoprazole (PROTONIX) injection 40 mg  40 mg Intravenous Q24H Irene Pap N, DO   40 mg at 10/18/20 5462  . polyvinyl alcohol (LIQUIFILM TEARS) 1.4 % ophthalmic solution 2 drop  2 drop Both Eyes PRN Irene Pap N, DO      . Resource ThickenUp Clear   Oral PRN Nita Sells, MD      . sodium chloride flush (NS) 0.9 % injection 3 mL  3 mL Intravenous Q12H Lenore Cordia, MD   3 mL at 10/18/20 0921  . sodium chloride HYPERTONIC 3 % nebulizer solution 4 mL  4 mL Nebulization Daily Irene Pap N, DO   4 mL at 10/17/20 1931     Discharge Medications: Please see discharge summary for a list of discharge medications.  Relevant Imaging Results:  Relevant Lab Results:   Additional Information SS# [id number]  [place], [place]

## 2020-10-18 NOTE — Consult Note (Signed)
Consultation Note Date: 10/18/2020   Patient Name: Ian [name]  DOB: [birth year]  MRN: [id number]  Age / Sex: 87 y.o., male   PCP: Bernerd Limbo, MD Referring Physician: Nita Sells, MD   REASON FOR CONSULTATION:Establishing goals of care  Palliative Care consult requested for goals of care discussion in this 87 y.o. male with multiple medical problems including multiple CVAs (2019), ETOH abuse, foot drop, hypertension, aortic aneurysm, and questionable dementia. Patient presented from home with concerns of altered mental status and found by wife on floor s/p unwitnessed fall. Work-up identified severe sepesis, and fracture through the anterior arch of C1. C1 somewhat posteriorly displaced. Since admission followed by SLP due to severe dysphagia.   Clinical Assessment and Goals of Care: I have reviewed medical records including lab results, imaging, Epic notes, and MAR. I spoke with patient's wife, Ian [name] to discuss diagnosis prognosis, GOC, EOL wishes, disposition and options.  I introduced Palliative Medicine as specialized medical care for people living with serious illness. It focuses on providing relief from the symptoms and stress of a serious illness. The goal is to improve quality of life for both the patient and the family. Wife verbalized understanding and appreciation.   We discussed a brief life review of the patient, along with his functional and nutritional status. Ian [name] states she and patient have been married 32 years. Patient has 2 sons who live in [place] from previous marriage. He was a retired Garment/textile technologist. Ian [name] is 87 years old.   Prior to admission she acknowledges some health decline in patient and concerns for undiagnosed dementia. She reports patient had memory loss which as worsened over the past 6 months. She also endorses patient has become incontinent of both bowel and bladder, significant decrease in appetite, and has  become more bedbound over the past 2-3 months. She is tearful expressing her continued efforts to provide care for him in the home as he wished to be.   We discussed His current illness and what it means in the larger context of His on-going co-morbidities. With specific discussions regarding dysphagia, high risk for continued aspiration, sepsis, and overall functional and nutritional decline. Natural disease trajectory and expectations at EOL were discussed.  Ian [name] is tearful in expressing her understanding of patient's current illness and co-morbidities. She reports she was hopeful his "current illness" was something temporary that could be fixed but now realizes what she "knows her heart was already prepared for, that his decline is irreversible. Emotional support provided.   I attempted to elicit values and goals of care important to the patient.    The difference between aggressive medical intervention and comfort care was considered in light of the patient's goals of care. I educated wife on what comfort care measures would look like.   Ian [name] reports she is in preparation of notifying patient's sons of his condition however, she is remaining hopeful for some stability. She is tearful expressing if he is unable to improve she knows that she is unable to care for him. She states she does not expect a major improvement but is hopeful for some stability.   We discussed at length patient's dysphagia and risk for aspiration. Extensive education provided on risk associated with aspiration not limited to recurrent pneumonia and patient's inability to meet nutritional requirements. Ian [name] verbalizes understanding.   We discussed at length artificial feedings tubes/PEG with education on risk and benefits. I advised against feeding tube  in elderly individuals with dementia and high aspiration risk. Acknowledging AMA guidelines against placement.   Ian [name] verbalized understanding and  appreciation. She states she would not be interested in any forms of artificial feedings as she knows patient would not want those measures and would not do well with them. She is aware of the risk associated with oral nutrition and request he be allowed comfort feedings in the consistency and under the necessary advisements of the medical team (SLP). She would not want to deprive him of the option to have nutrition and is prepared for further complications if they arise.   Ian [name] expresses wishes to continue to treat the treatable. She is remaining somewhat hopeful for stability and the opportunity for patient to thrive as much as possible. She is requesting consideration for SNF rehab with awareness this is a temporary option and patient may further decline or not do as well as expected at facility. She verbalizes she will be prepared to make further decisions at that time which would focus on his comfort and end-of-life. She shares she will be at peace with the decision for comfort knowing she allowed him an opportunity to thrive. She acknowledges if patient was to further decline prior to discharge to SNF she would be open to transitioning care to focus on comfort at that time.   Wife confirms DNR/DNI. Patient does not have an advanced directive. Ian [name] confirms her wishes for patient to not undergo any life-prolonging measures or aggressive interventions.   Hospice and Palliative Care services outpatient were explained and offered. Wife verbalized understanding and awareness of both palliative and hospice's goals and philosophy of care. She is requesting outpatient Palliative support at SNF rehab with understanding care can transition at anytime to focus on comfort and hospice by notifying patient's medical team.   Questions and concerns were addressed.The family was encouraged to call with questions or concerns.  PMT will continue to support holistically.   SOCIAL HISTORY:     reports that  he has never smoked. He has never used smokeless tobacco. He reports current alcohol use of about 6.0 - 12.0 standard drinks of alcohol per week. He reports that he does not use drugs.  CODE STATUS: DNR  ADVANCE DIRECTIVES: Primary Decision Maker: WIFE    SYMPTOM MANAGEMENT: per attending   Palliative Prophylaxis:   Aspiration, Bowel Regimen, Delirium Protocol, Eye Care, Frequent Pain Assessment, Oral Care and Turn Reposition  PSYCHO-SOCIAL/SPIRITUAL:  Support System: Family  Desire for further Chaplaincy support:No   Additional Recommendations (Limitations, Scope, Preferences):  No Artificial Feeding and Treat the treatable, no escalation, DNR  Education on hospice/palliative    PAST MEDICAL HISTORY: Past Medical History:  Diagnosis Date  . 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 Calculus of kidney 03/31/2013   Overview:  IMPRESSION: see ER note   . Hypertension   . Leukocytosis 10/26/2017  . Loss of weight 10/26/2017  . Multiple leg contusions, unspecified laterality, sequela 10/26/2017  . Scalp hematoma, subsequent encounter 10/26/2017  . Stroke [facility])     ALLERGIES:  has No Known Allergies.   MEDICATIONS:  Current Facility-Administered Medications  Medication Dose Route Frequency Provider Last Rate Last Admin  . acetaminophen (TYLENOL) tablet 650 mg  650 mg Oral Q6H PRN Lenore Cordia, MD       Or  . acetaminophen (TYLENOL) suppository 650 mg  650 mg Rectal Q6H PRN Zada Finders R, MD      . ceFEPIme (MAXIPIME) 2 g  in sodium chloride 0.9 % 100 mL IVPB  2 g Intravenous Q8H Adrian Saran, [place]      . dextrose 5% in lactated ringers with KCl 20 mEq/L infusion   Intravenous Continuous Kayleen Memos, DO 30 mL/hr at 10/17/20 2242 New Bag at 10/17/20 2242  . enoxaparin (LOVENOX) injection 40 mg  40 mg Subcutaneous Q24H Irene Pap N, DO   40 mg at 10/17/20 2205  . ipratropium-albuterol (DUONEB) 0.5-2.5 (3) MG/3ML nebulizer solution 3 mL  3 mL Nebulization BID Irene Pap N, DO   3 mL at 10/17/20 1931  . [START ON 10/20/2020] levothyroxine (SYNTHROID, LEVOTHROID) injection 25 mcg  25 mcg Intravenous Daily Hall, Carole N, DO      . ondansetron (ZOFRAN) tablet 4 mg  4 mg Oral Q6H PRN Lenore Cordia, MD       Or  . ondansetron (ZOFRAN) injection 4 mg  4 mg Intravenous Q6H PRN Zada Finders R, MD      . pantoprazole (PROTONIX) injection 40 mg  40 mg Intravenous Q24H Irene Pap N, DO   40 mg at 10/18/20 6734  . polyvinyl alcohol (LIQUIFILM TEARS) 1.4 % ophthalmic solution 2 drop  2 drop Both Eyes PRN Irene Pap N, DO      . Resource ThickenUp Clear   Oral PRN Nita Sells, MD      . sodium chloride flush (NS) 0.9 % injection 3 mL  3 mL Intravenous Q12H Lenore Cordia, MD   3 mL at 10/18/20 0921  . sodium chloride HYPERTONIC 3 % nebulizer solution 4 mL  4 mL Nebulization Daily Irene Pap N, DO   4 mL at 10/17/20 1931    VITAL SIGNS: BP 104/70   Pulse 80   Temp 97.6 F (36.4 C)   Resp 18   Ht 5\' 11"  (1.803 m)   Wt 84.6 kg   SpO2 94%   BMI 26.01 kg/m  Filed Weights   10/15/20 1328 10/17/20 0500 10/18/20 0500  Weight: 81.6 kg 80 kg 84.6 kg    Estimated body mass index is 26.01 kg/m as calculated from the following:   Height as of this encounter: 5\' 11"  (1.803 m).   Weight as of this encounter: 84.6 kg.  LABS: CBC:    Component Value Date/Time   WBC 8.9 10/18/2020 0405   HGB 11.0 (L) 10/18/2020 0405   HCT 33.5 (L) 10/18/2020 0405   PLT 247 10/18/2020 0405   Comprehensive Metabolic Panel:    Component Value Date/Time   NA 143 10/18/2020 0405   K 3.8 10/18/2020 0405   BUN 16 10/18/2020 0405   CREATININE 0.66 10/18/2020 0405   ALBUMIN 2.0 (L) 10/18/2020 0405     Review of Systems  Unable to perform ROS: Acuity of condition   Prognosis: Guarded-Poor   Discharge Planning:  To Be Determined  Recommendations: . DNR/DNI-as confirmed by wife . Continue with current plan of care, no aggressive interventions . Wife  realistic in expectations. She remains hopeful for some stability. Would like to allow patient an opportunity to thrive however if no improvement or further decline would wish to focus on his comfort/EOL.  Marland Kitchen Extensive education on dysphagia/aspiration. Wife verbalized understanding. She does not want any artificial feeding/PEG tubes. Requesting comfort feeds with appropriate diet/restrictions as provided by SLP. Aware of risk.  . Wife requesting SNF rehab with outpatient palliative. (TOC referral placed)  . PMT will continue to support and follow as needed. Please call team line  with urgent needs.   Palliative Performance Scale: PPS 20-30%               Wife expressed understanding and was in agreement with this plan.   Thank you for allowing the Palliative Medicine Team to assist in the care of this patient.  Time In: 1430 Time Out: 1535 Time Total: 65 min.  Visit consisted of counseling and education dealing with the complex and emotionally intense issues of symptom management and palliative care in the setting of serious and potentially life-threatening illness.Greater than 50%  of this time was spent counseling and coordinating care related to the above assessment and plan.  Signed by:  Alda Lea, AGPCNP-BC Palliative Medicine Team  Phone: [phone] Pager: [phone] Amion: Bjorn Pippin

## 2020-10-18 NOTE — Progress Notes (Signed)
PROGRESS NOTE   Ian [name]  WNU:[id number] DOB: 15-Mar-1926 DOA: 10/15/2020 PCP: Bernerd Limbo, MD  Brief Narrative:  87 year old home dwelling male History recurrent CVAs 10/25/2017 + prior bifrontal with chronic foot drop since that time --subarachnoid's/subdurals and EtOH abuse-probable dementia from both HTN Hypothyroid with complex thyroid nodule in the past Ascending aortic aneurysm in the past Prior upper GI bleed 11/09/2017 from duodenal ulcers  Admitted from home 10/15/2020 with significant confusion-does not drink heavily any further Found to have several falls recently, wife found him on the ground unkempt with feces and body fluids about On work-up found to have severe sepsis secondary to lower extremity cellulitis superimposed on significant lower extremity edema, C1-C2 instability with new type II dens fracture at C1 arch from prior to admission   Assessment & Plan:   Severe sepsis lower extremity cellulitis prior to admission superimposed on anasarca Transition Ancef 1 g every 8-->cefepime for daily dosing MRSA screen is negative Continuing D5 with 20 of K 30 cc an hour given poor p.o. Type II dens fracture C1 arch Needs rigid cervical collar at all times for 6 weeks-poor surgical candidate per Dr. Venetia Constable neurosurgery who saw patient 2/20 Pain control with Tylenol p.o. Dysphagia, deemed unsafe to eat by SLP Severe aspiration risk based on evaluation 2/21 We will start floor stock only with nectar thick liquids-patient is high risk for further aspiration and worsening Cefepime as above  Repeat x-ray 2/23 Recurrent CVAs last 1 10/25/2017 Not on prior to admission aspirin Holding at this time atorvastatin and Plavix in addition Prior upper GI bleed 10/2017 No anticoagulation given risk of bleeding Complex thyroid mass Continue Synthroid 50 mcg daily before breakfast Poor candidate for work-up of mass in current state Ascending aortic aneurysm previously Poor candidate  for work-up   DVT prophylaxis: Lovenox Code Status: DNR Family Communication: Called and discussed with spouse [name] [phone] discussed with her overall of communicating with him-also difficulty clarified that he does not have a formal diagnosis of dementia however has had some underlying cognitive issues for a while per wife His wife states that she understands the risk of ongoing feeding-I explained to her the risk of worsening infection and the fact that he is coughing and has been deemed a poor candidate to take p.o. I reached out to palliative care who will help further consolidate the goals of care Disposition:  Status is: Inpatient  Remains inpatient appropriate because:Hemodynamically unstable, Ongoing diagnostic testing needed not appropriate for outpatient work up and Unsafe d/c plan   Dispo:  Patient From: Home  Planned Disposition: [place]  Expected discharge date: 10/20/2020  Medically stable for discharge: No         Consultants:   Neurosurgery  Palliative care  Procedures: None  Antimicrobials: Currently cefepime   Subjective:  awake but quite verbally aggressive at times "who are you, what do you want" I tried to explain but he did not seem to completely understand until I told him he has a broken bone in his neck He seemed a little bit more mollified subsequently but did not really cooperate with exam  Objective: Vitals:   10/17/20 1931 10/17/20 1949 10/18/20 0335 10/18/20 0500  BP:  (!) 122/98 104/70   Pulse:  80 80   Resp:  18 18   Temp:  97.8 F (36.6 C) 97.6 F (36.4 C)   TempSrc:      SpO2: 100% 97% 94%   Weight:    84.6 kg  Height:  Intake/Output Summary (Last 24 hours) at 10/18/2020 1221 Last data filed at 10/18/2020 0645 Gross per 24 hour  Intake 372.07 ml  Output 200 ml  Net 172.07 ml   Filed Weights   10/15/20 1328 10/17/20 0500 10/18/20 0500  Weight: 81.6 kg 80 kg 84.6 kg    Examination:  Coherent x1  only somewhat disoriented Agitated but calms easily S1-S2 no murmur no rub no gallop Abdomen soft nontender no rebound no guarding Lower extremities in PR AFO-wounds not examined Shows 4/4 power in upper extremities moving upper extremities freely however formal evaluation deferred  Data Reviewed: personally reviewed   CBC    Component Value Date/Time   WBC 8.9 10/18/2020 0405   RBC 3.34 (L) 10/18/2020 0405   HGB 11.0 (L) 10/18/2020 0405   HCT 33.5 (L) 10/18/2020 0405   PLT 247 10/18/2020 0405   MCV 100.3 (H) 10/18/2020 0405   MCH 32.9 10/18/2020 0405   MCHC 32.8 10/18/2020 0405   RDW 14.8 10/18/2020 0405   LYMPHSABS 0.9 10/15/2020 1415   MONOABS 1.1 (H) 10/15/2020 1415   EOSABS 0.1 10/15/2020 1415   BASOSABS 0.0 10/15/2020 1415   CMP Latest Ref Rng & Units 10/18/2020 10/17/2020 10/16/2020  Glucose 70 - 99 mg/dL 101(H) 93 78  BUN 8 - 23 mg/dL 16 16 22   Creatinine 0.61 - 1.24 mg/dL 0.66 0.65 0.79  Sodium 135 - 145 mmol/L 143 143 142  Potassium 3.5 - 5.1 mmol/L 3.8 4.2 3.8  Chloride 98 - 111 mmol/L 114(H) 113(H) 112(H)  CO2 22 - 32 mmol/L 21(L) 17(L) 21(L)  Calcium 8.9 - 10.3 mg/dL 10.1 9.8 9.8  Total Protein 6.5 - 8.1 g/dL 4.7(L) 4.9(L) 5.0(L)  Total Bilirubin 0.3 - 1.2 mg/dL 0.8 0.7 1.4(H)  Alkaline Phos 38 - 126 U/L 87 91 88  AST 15 - 41 U/L 35 40 53(H)  ALT 0 - 44 U/L 20 21 27      Radiology Studies: DG CHEST PORT 1 VIEW  Result Date: 10/16/2020 CLINICAL DATA:  87 year old male with hypoxia. EXAM: PORTABLE CHEST 1 VIEW COMPARISON:  Chest radiograph dated 10/15/2020. FINDINGS: Mild cardiomegaly with mild vascular congestion. Bibasilar streaky densities may represent atelectasis or infiltrate. There is mild eventration of the right hemidiaphragm. No large pleural effusion. No pneumothorax. Atherosclerotic calcification of the aorta. No acute osseous pathology. Surgical clips over the upper mediastinum as before. IMPRESSION: 1. Mild cardiomegaly with mild central vascular  congestion. 2. Bibasilar atelectasis/scarring versus developing infiltrate. Electronically Signed   By: Anner Crete M.D.   On: 10/16/2020 17:08   DG Swallowing Func-Speech Pathology  Result Date: 10/17/2020 Objective Swallowing Evaluation: Type of Study: MBS-Modified Barium Swallow Study  Patient Details Name: Syed Zukas MRN: [id number] Date of Birth: 1926/05/21 Today's Date: 10/17/2020 Time: SLP Start Time (ACUTE ONLY): 1551 -SLP Stop Time (ACUTE ONLY): 1630 SLP Time Calculation (min) (ACUTE ONLY): 39 min Past Medical History: Past Medical History: Diagnosis Date . Benign neoplasm of parathyroid gland 03/31/2013 . Calculus of kidney 03/31/2013  Overview:    see ER note  . Hypertension  . Leukocytosis 10/26/2017 . Loss of weight 10/26/2017 . Multiple leg contusions, unspecified laterality, sequela 10/26/2017 . Scalp hematoma, subsequent encounter 10/26/2017 . Stroke [facility])  Past Surgical History: Past Surgical History: Procedure Laterality Date . ESOPHAGOGASTRODUODENOSCOPY (EGD) WITH PROPOFOL N/A 11/09/2017  Procedure: ESOPHAGOGASTRODUODENOSCOPY (EGD) WITH PROPOFOL;  Surgeon: Laurence Spates, MD;  Location: [place];  Service: Endoscopy;  Laterality: N/A; HPI: Patient is a 87 y.o. male with PMH: CVA (2019), HTN,  HLD, hypothyroidism, alcohol use disorder, dementia, who presented to ED via EMS for evaluation of confusion. Per wife's report at time of admission, patient had fall 2-3 days prior resulting in right shin injury. She found patient down on floor presumed to be from unwitnessed fall. Head CT did not reveal any acute intracranial abnormalities. CXR revealed chronic airspace opacities at lung bases favoring atelectasis or scarring.  Subjective: awake in chair in flouro Assessment / Plan / Recommendation CHL IP CLINICAL IMPRESSIONS 10/17/2020 Clinical Impression Mr Rainford presents with moderately severe oropharyngeal dysphagia with sensorimotor deficits.  He was tested with hard cervical collar in place per  neurosurgery due to his new fracture to anterior arch of C1.  Oropharyngeal musculature weakness results in impaired oral control with delayed oral transiting due to lingual pumping A-P, decreased propulsion.  Delay was most notable with solids/puree - which pt required 12 seconds with excessive lingual pumping to transit.  Pharyngeal swallow was weak impairing epiglottic deflection, laryngeal elevation, pharyngeal contraction resulting in retention *at vallecular region more than pyriform sinus.  Cues for "strong swallow" clinically helpful to improve muscle recruitment however doubtful this will be replicated consistently during meals and suspect pt will fatigue.  Cues to "[facility]" were effective to clear large amount of vallecular and tongue base retention that pt does not sense. Pt actually stated "I hock all of the time" to this SLP - suspected some component of chronic dysphagia.  Fortunately, he can clear up to 80% of vallecular retention with great effort - and suspect this has been vital in protecting his airway.   Fatigue factor noted as when testing concluded pt continued with barium retention despite efforts to clear.  His pharyngeal swallow is also delayed with puree/cracker mixture barium aggregating in vallecular space prior to swallow trigger. Once during testing,  pt stated "OK girls" - indicating he had swallowed when pudding was retained at tongue base and vallecular space without pt swallowing.   SLP provided pt with necter liquid to facilitate swallow.  Given pt's decreased oral control and overt aspiration observed with thin water at bedside, recommend nectar liquids -full liquids/nectar diet initially with strategies to mitigate his risk. Advise he be allowed ice chips and tsp amounts of thin water between meals after oral care.  Highly advise consider a palliative consult for this patient given his deconditioning, current illness, level of dysphagia with concern for meeting nutritional needs and  aspiration risk.  Pt admits his swallowing ability is more impaired that baseline.   SLP had explained to [name], pt's wife, that goal of testing is to determine if any consistency and strategy may mitigate aspiration but not prevent it.  Wife reports not desire for feeding tube.  Will follow for treatment, family education and readiness for dietary advancement.  Pt's wife was not present during evaluation but was invited to attend.   SLP Visit Diagnosis Dysphagia, oropharyngeal phase (R13.12) Attention and concentration deficit following -- Frontal lobe and executive function deficit following -- Impact on safety and function Severe aspiration risk;Risk for inadequate nutrition/hydration   CHL IP TREATMENT RECOMMENDATION 10/17/2020 Treatment Recommendations Therapy as outlined in treatment plan below   Prognosis 10/17/2020 Prognosis for Safe Diet Advancement Fair Barriers to Reach Goals Cognitive deficits;Time post onset Barriers/Prognosis Comment -- CHL IP DIET RECOMMENDATION 10/17/2020 SLP Diet Recommendations Nectar thick liquid;Free water protocol after oral care Liquid Administration via Cup Medication Administration Crushed with puree Compensations Slow rate;Small sips/bites;Multiple dry swallows after each bite/sip;Other (Comment) Postural Changes Seated upright  at 90 degrees;Remain semi-upright after after feeds/meals (Comment)   CHL IP OTHER RECOMMENDATIONS 10/17/2020 Recommended Consults -- Oral Care Recommendations Oral care BID Other Recommendations --   CHL IP FOLLOW UP RECOMMENDATIONS 10/17/2020 Follow up Recommendations Skilled Nursing facility;24 hour supervision/assistance   CHL IP FREQUENCY AND DURATION 10/17/2020 Speech Therapy Frequency (ACUTE ONLY) min 2x/week Treatment Duration 1 week      CHL IP ORAL PHASE 10/17/2020 Oral Phase Impaired Oral - Pudding Teaspoon -- Oral - Pudding Cup -- Oral - Honey Teaspoon -- Oral - Honey Cup -- Oral - Nectar Teaspoon Decreased bolus cohesion;Premature spillage;Weak  lingual manipulation;Reduced posterior propulsion Oral - Nectar Cup Decreased bolus cohesion;Reduced posterior propulsion;Premature spillage;Weak lingual manipulation Oral - Nectar Straw Decreased bolus cohesion;Reduced posterior propulsion;Premature spillage;Weak lingual manipulation Oral - Thin Teaspoon Decreased bolus cohesion;Reduced posterior propulsion;Premature spillage;Weak lingual manipulation Oral - Thin Cup Decreased bolus cohesion;Reduced posterior propulsion;Weak lingual manipulation;Right anterior bolus loss Oral - Thin Straw Reduced posterior propulsion;Weak lingual manipulation;Lingual pumping Oral - Puree Decreased bolus cohesion;Reduced posterior propulsion;Weak lingual manipulation;Lingual pumping Oral - Mech Soft Decreased bolus cohesion;Reduced posterior propulsion;Weak lingual manipulation;Impaired mastication;Lingual pumping Oral - Regular -- Oral - Multi-Consistency -- Oral - Pill -- Oral Phase - Comment --  CHL IP PHARYNGEAL PHASE 10/17/2020 Pharyngeal Phase Impaired Pharyngeal- Pudding Teaspoon -- Pharyngeal -- Pharyngeal- Pudding Cup -- Pharyngeal -- Pharyngeal- Honey Teaspoon -- Pharyngeal -- Pharyngeal- Honey Cup -- Pharyngeal -- Pharyngeal- Nectar Teaspoon Delayed swallow initiation-vallecula;Reduced epiglottic inversion;Reduced laryngeal elevation;Reduced airway/laryngeal closure;Pharyngeal residue - valleculae;Pharyngeal residue - pyriform;Reduced tongue base retraction Pharyngeal -- Pharyngeal- Nectar Cup Pharyngeal residue - valleculae;Pharyngeal residue - pyriform;Delayed swallow initiation-vallecula;Reduced epiglottic inversion;Reduced anterior laryngeal mobility;Reduced pharyngeal peristalsis;Reduced laryngeal elevation;Reduced tongue base retraction;Penetration/Aspiration during swallow;Penetration/Apiration after swallow Pharyngeal Material enters airway, remains ABOVE vocal cords and not ejected out Pharyngeal- Nectar Straw Delayed swallow initiation-vallecula;Reduced  epiglottic inversion;Reduced anterior laryngeal mobility;Reduced laryngeal elevation;Reduced airway/laryngeal closure;Reduced tongue base retraction;Penetration/Aspiration during swallow;Penetration/Apiration after swallow;Trace aspiration;Reduced pharyngeal peristalsis Pharyngeal Material enters airway, passes BELOW cords and not ejected out despite cough attempt by patient Pharyngeal- Thin Teaspoon Delayed swallow initiation-vallecula;Reduced laryngeal elevation;Reduced airway/laryngeal closure;Reduced tongue base retraction;Reduced epiglottic inversion;Reduced pharyngeal peristalsis;Penetration/Aspiration during swallow;Penetration/Apiration after swallow;Trace aspiration Pharyngeal Material enters airway, passes BELOW cords without attempt by patient to eject out (silent aspiration) Pharyngeal- Thin Cup Delayed swallow initiation-vallecula;Reduced epiglottic inversion;Reduced pharyngeal peristalsis;Reduced anterior laryngeal mobility;Reduced laryngeal elevation;Reduced tongue base retraction;Penetration/Aspiration before swallow;Penetration/Aspiration during swallow;Moderate aspiration;Pharyngeal residue - valleculae;Pharyngeal residue - pyriform Pharyngeal Material enters airway, passes BELOW cords without attempt by patient to eject out (silent aspiration) Pharyngeal- Thin Straw Delayed swallow initiation-vallecula;Reduced pharyngeal peristalsis;Reduced epiglottic inversion;Reduced anterior laryngeal mobility;Reduced laryngeal elevation;Reduced airway/laryngeal closure;Reduced tongue base retraction;Pharyngeal residue - valleculae;Pharyngeal residue - pyriform Pharyngeal Material enters airway, passes BELOW cords and not ejected out despite cough attempt by patient Pharyngeal- Puree Delayed swallow initiation-vallecula;Reduced epiglottic inversion;Reduced pharyngeal peristalsis;Reduced laryngeal elevation;Reduced airway/laryngeal closure;Reduced tongue base retraction;Pharyngeal residue - valleculae;Pharyngeal  residue - pyriform Pharyngeal Material does not enter airway Pharyngeal- Mechanical Soft Delayed swallow initiation-vallecula;Reduced pharyngeal peristalsis;Reduced epiglottic inversion;Reduced anterior laryngeal mobility;Reduced laryngeal elevation;Reduced airway/laryngeal closure;Reduced tongue base retraction;Pharyngeal residue - valleculae;Pharyngeal residue - pyriform Pharyngeal Material does not enter airway Pharyngeal- Regular -- Pharyngeal -- Pharyngeal- Multi-consistency -- Pharyngeal -- Pharyngeal- Pill -- Pharyngeal -- Pharyngeal Comment --  CHL IP CERVICAL ESOPHAGEAL PHASE 10/17/2020 Cervical Esophageal Phase Impaired Pudding Teaspoon -- Pudding Cup -- Honey Teaspoon -- Honey Cup -- Nectar Teaspoon -- Nectar Cup -- Nectar Straw -- Thin Teaspoon -- Thin Cup -- Thin Straw -- Puree -- Mechanical Soft -- Regular -- Multi-consistency --  Pill -- Cervical Esophageal Comment Prominent Cricopharyngeus present - observed with nectar thick liquids.   Secretions and barium retained at pyriform sinus region.  Pt observed to frequently belch during testing without observation of backflowed barium into pharynx. Macario Golds 10/17/2020, 5:35 PM              ECHOCARDIOGRAM COMPLETE  Result Date: 10/16/2020    ECHOCARDIOGRAM REPORT   Patient Name:   DEYONTE CADDEN Date of Exam: 10/16/2020 Medical Rec #:  [id number]    Height:       71.0 in Accession #:    [id number]   Weight:       180.0 lb Date of Birth:  1926/08/10     BSA:          2.016 m Patient Age:    62 years     BP:           108/81 mmHg Patient Gender: M            HR:           83 bpm. Exam Location:  Inpatient Procedure: Limited Echo, Cardiac Doppler and Color Doppler Indications:    Atrial fibrillation  History:        Patient has prior history of Echocardiogram examinations, most                 recent 10/26/2017. Stroke, Signs/Symptoms:Dementia; Risk                 Factors:Hypertension and Dyslipidemia. Confusion, Alcohol abuse.  Sonographer:    Dustin Flock Referring Phys: [id number] VISHAL R PATEL  Sonographer Comments: Image acquisition challenging due to uncooperative patient. IMPRESSIONS  1. Study ended abruptly Pt not cooperative. Reorder when patient calmer.  2. There is mild left ventricular hypertrophy.  3. The aortic valve is abnormal. FINDINGS  Left Ventricle: The left ventricular internal cavity size was normal in size. There is mild left ventricular hypertrophy. Aortic Valve: The aortic valve is abnormal. Pulmonic Valve: The pulmonic valve was not well visualized. Pulmonic valve regurgitation is not visualized. Aorta: The aortic root is normal in size and structure.  LEFT VENTRICLE PLAX 2D LVIDd:         4.16 cm LVIDs:         1.98 cm LV PW:         1.35 cm LV IVS:        1.23 cm LVOT diam:     2.20 cm LVOT Area:     3.80 cm  LEFT ATRIUM         Index LA diam:    2.90 cm 1.44 cm/m   AORTA Ao Root diam: 3.30 cm  SHUNTS Systemic Diam: 2.20 cm Dorris Carnes MD Electronically signed by Dorris Carnes MD Signature Date/Time: 10/16/2020/5:21:00 PM    Final      Scheduled Meds: . enoxaparin (LOVENOX) injection  40 mg Subcutaneous Q24H  . ipratropium-albuterol  3 mL Nebulization BID  . [START ON 10/20/2020] levothyroxine  25 mcg Intravenous Daily  . pantoprazole (PROTONIX) IV  40 mg Intravenous Q24H  . sodium chloride flush  3 mL Intravenous Q12H  . sodium chloride HYPERTONIC  4 mL Nebulization Daily   Continuous Infusions: .  ceFAZolin (ANCEF) IV 1 g (10/18/20 4580)  . dextrose 5% lactated ringers with KCl 20 mEq/L 30 mL/hr at 10/17/20 2242     LOS: 3 days   Time spent [place], MD [facility] To contact  the attending provider between 7A-7P or the covering provider during after hours 7P-7A, please log into the web site [email or web address] and access using universal [place] password for that web site. If you do not have the password, please call the hospital operator.  10/18/2020, 12:21 PM

## 2020-10-18 NOTE — TOC Initial Note (Addendum)
Transition of Care [facility]) - Initial/Assessment Note    Patient Details  Name: Ian [name] MRN: [id number] Date of Birth: November 10, 1925  [facility] CM/SW Contact:    Trish Mage, LCSW Phone Number: 10/18/2020, 2:26 PM  Clinical Narrative:  Spoke to wife in follow up to PT recommendation of SNF v 24 hour care.  Ms Langille makes it clear that she is unable to care for patient in home, but also is not ready to proceed with SNF referral until she has a chance to speak with palliative team member.  She is hoping for a call today.  We agreed that I would hold off on any referral until we hear from palliative.  TOC will continue to follow during the course of hospitalization.  Addendum:  Received message from [facility] with palliative team that plan is SNF bed with palliative referral.  Bed search initiated.                  Expected Discharge Plan:  (hospice v SNF) Barriers to Discharge: Other (comment) (Dispositional plan not yet identified/palliative involvement)   Patient Goals and CMS Choice     Choice offered to / list presented to : Spouse  Expected Discharge Plan and Services Expected Discharge Plan:  (hospice v SNF)   Discharge Planning Services: CM Consult   Living arrangements for the past 2 months: Single Family Home                                      Prior Living Arrangements/Services Living arrangements for the past 2 months: Single Family Home Lives with:: Spouse Patient language and need for interpreter reviewed:: Yes        Need for Family Participation in Patient Care: Yes (Comment) Care giver support system in place?: Yes (comment) Current home services: DME Criminal Activity/Legal Involvement Pertinent to Current Situation/Hospitalization: No - Comment as needed  Activities of Daily Living Home Assistive Devices/Equipment: Walker (specify type) ADL Screening (condition at time of admission) Patient's cognitive ability adequate to safely  complete daily activities?: No Is the patient deaf or have difficulty hearing?: No Does the patient have difficulty seeing, even when wearing glasses/contacts?: No Does the patient have difficulty concentrating, remembering, or making decisions?: Yes Patient able to express need for assistance with ADLs?: No Does the patient have difficulty dressing or bathing?: Yes Independently performs ADLs?: No Communication: Independent Dressing (OT): Needs assistance Is this a change from baseline?: Change from baseline, expected to last >3 days Grooming: Needs assistance Is this a change from baseline?: Change from baseline, expected to last >3 days Feeding: Needs assistance Is this a change from baseline?: Change from baseline, expected to last >3 days Bathing: Needs assistance Is this a change from baseline?: Change from baseline, expected to last >3 days Toileting: Needs assistance Is this a change from baseline?: Change from baseline, expected to last >3days In/Out Bed: Needs assistance Is this a change from baseline?: Change from baseline, expected to last >3 days Walks in Home: Needs assistance Is this a change from baseline?: Change from baseline, expected to last >3 days Does the patient have difficulty walking or climbing stairs?: Yes Weakness of Legs: Both Weakness of Arms/Hands: Both  Permission Sought/Granted Permission sought to share information with : Family Supports Permission granted to share information with : No  Share Information with NAME: Blease, Capaldi (Spouse)   [phone]  Emotional Assessment Appearance:: Appears stated age Attitude/Demeanor/Rapport: Lethargic Affect (typically observed): Appropriate Orientation: : Oriented to Self Alcohol / Substance Use: Alcohol Use Psych Involvement: No (comment)  Admission diagnosis:  Fracture of anterior arch of C1 ([place]) [S12.000A] Patient Active Problem List   Diagnosis Date Noted  . Fracture of  anterior arch of C1 ([place]) 10/15/2020  . Severe sepsis ([place]) 10/15/2020  . Cellulitis of left lower extremity 10/15/2020  . Acute metabolic encephalopathy 82/95/6213  . Anemia 02/12/2018  . Duodenal ulcer 02/12/2018  . History of stroke 02/12/2018  . ETOH abuse 02/12/2018  . Right foot drop 02/12/2018  . Dyslipidemia 11/16/2017  . GI bleed due to NSAIDs 11/09/2017  . Melena 11/09/2017  . Acute blood loss anemia 11/09/2017  . GI bleed 11/09/2017  . Peptic ulcer disease   . Dysphagia, post-stroke   . Essential hypertension   . Embolic cerebral infarction ([place]) 10/29/2017  . Dementia without behavioral disturbance ([place])   . Benign essential HTN   . Diastolic dysfunction   . Hypothyroidism   . Hypokalemia   . Altered mental status   . Pressure injury of skin 10/26/2017  . Loss of weight 10/26/2017  . Elevated troponin I level 10/26/2017  . Leukocytosis 10/26/2017  . Multiple leg contusions, unspecified laterality, sequela 10/26/2017  . Cognitive impairment 10/26/2017  . Scalp hematoma, subsequent encounter 10/26/2017  . Head injury 10/26/2017  . Fall at home, sequela 10/26/2017  . Right BBB/left ant fasc block 10/26/2017  . Acute confusional state 10/26/2017  . Dizziness and giddiness 10/26/2017  . Cerebral infarction [facility]), right occipital lobe 10/26/2017  . Elevated AST (SGOT) 10/26/2017  . Shoulder injury, right, subsequent encounter 10/26/2017  . Paranasal sinus disease 10/26/2017  . Maxillary sinusitis 10/26/2017  . Ethmoid sinusitis 10/26/2017  . Sphenoid sinusitis 10/26/2017  . Rhabdomyolysis 10/25/2017  . Neuropathy 03/31/2013   PCP:  Bernerd Limbo, MD Pharmacy:   [facility], [place] [address] Phone: [phone] Fax: [phone]     Social Determinants of Health (SDOH) Interventions    Readmission Risk Interventions No flowsheet data found.

## 2020-10-18 NOTE — Progress Notes (Signed)
  Speech Language Pathology Treatment: Dysphagia  Patient Details Name: Ian [name] MRN: [id number] DOB: [birth year] Today's Date: 10/18/2020 Time: 4970-2637 SLP Time Calculation (min) (ACUTE ONLY): 24 min  Assessment / Plan / Recommendation Clinical Impression  Pt seen to educate him regarding his dysphagia and assess po tolerance.  No family present today.  Pt in bed with nearly empty meal tray by sink.    He declined to consume anything besides cold water - SLP provided him with cold thin water and cold nectar thick liquids.  With small sips from cup x4, pt did not produce cough - audiible multiple swallow continues.  However immediately post-swallow of 2nd straw bolus, immediate moderate amount of coughing produced without pt having effective expectoration.  He overtly aspirates thin liquids on MBS and this appears to cause his discomfort - thus recommend continue nectar.    During MBS, pt able to expectorate several times during testing thus encouraged him to maintain this strength.  SLP provided pt with extra length of suction tubing to allow him improved ease of use.  Pt desires his water to be very cold but did not complain CH:YIFOYD consistency. Hopefully the reduced amount of coughing is increasing his comfort with intake.    Note palliative documentation from their [place] with plan to treat the treatable with hope that pt will stabilize, go to rehab and improve.  SLP will follow up with wife to review MBS in detail with her and reasoning for diet and compensation recommendations.  Pt does appear to become frustrated easily with attempts to discuss basic information re: his dysphagia. Note plans for repeat CXR tomorrow.     HPI HPI: Patient is a 87 y.o. male with PMH: CVA (2019), HTN, HLD, hypothyroidism, alcohol use disorder, dementia, who presented to ED via EMS for evaluation of confusion. Per wife's report at time of admission, patient had fall 2-3 days prior resulting in right shin  injury. She found patient down on floor presumed to be from unwitnessed fall. Head CT did not reveal any acute intracranial abnormalities. CXR revealed chronic airspace opacities at lung bases favoring atelectasis or scarring. Pt is s/p MBS study and wife and pt agreeable to no feeding tube and accepting po intake with known aspiration risk.  SLP follow up to review MBS finding with pt and wife if pt is present and to post precautions.  RN reports pt doing better with nectar via cup. Approximately 1/2 of his lunch consumed.      SLP Plan  Continue with current plan of care       Recommendations  Diet recommendations: Dysphagia 1 (puree);Nectar-thick liquid (thin water via tsp between meals) Liquids provided via: No straw;Cup;Teaspoon Medication Administration: Via alternative means Compensations: Slow rate;Small sips/bites;Multiple dry swallows after each bite/sip;Other (Comment)                Oral Care Recommendations: Oral care QID Follow up Recommendations: Skilled Nursing facility;24 hour supervision/assistance SLP Visit Diagnosis: Dysphagia, oropharyngeal phase (R13.12) Plan: Continue with current plan of care       [place], Harinder Romas Ann 10/18/2020, 8:04 PM   Kathleen Lime, MS [facility] SLP Acute Rehab Services Office [phone] Pager [phone]

## 2020-10-18 NOTE — Progress Notes (Signed)
Patient refused dressing change to LLE.

## 2020-10-18 NOTE — Progress Notes (Signed)
Pharmacy Antibiotic Note  Ian [name] is a 87 y.o. male admitted on 10/15/2020 with pneumonia.  Pharmacy has been consulted for cefepime dosing.  Plan: Cefepime 2g IV q8  Height: 5\' 11"  (180.3 cm) Weight: 84.6 kg (186 lb 8.2 oz) IBW/kg (Calculated) : 75.3  Temp (24hrs), Avg:97.7 F (36.5 C), Min:97.6 F (36.4 C), Max:97.8 F (36.6 C)  Recent Labs  Lab 10/15/20 1410 10/15/20 1415 10/15/20 1430 10/15/20 2043 10/16/20 0335 10/17/20 0841 10/18/20 0405  WBC  --  15.6*  --   --  12.6* 11.5* 8.9  CREATININE  --  0.88 0.70  --  0.79 0.65 0.66  LATICACIDVEN 3.5*  --   --  1.4  --   --   --     Estimated Creatinine Clearance: 62.8 mL/min (by C-G formula based on SCr of 0.66 mg/dL).    No Known Allergies    Thank you for allowing pharmacy to be a part of this patient's care.  Kara Mead 10/18/2020 1:55 PM

## 2020-10-18 NOTE — Care Management Important Message (Signed)
Important Message  Patient Details IM Letter placed in Patient's room and copy mailed to wife. Name: Ian [name] MRN: [id number] Date of Birth: 11/25/1925   Medicare Important Message Given:  Yes     Kerin Salen 10/18/2020, 11:05 AM

## 2020-10-18 NOTE — Evaluation (Signed)
Physical Therapy Evaluation Patient Details Name: Ian [name] MRN: [id number] DOB: 11-08-1925 Today's Date: 10/18/2020   History of Present Illness  87 y.o. male with medical history significant for history of CVA on Plavix, hypertension, hyperlipidemia, hypothyroidism, RBBB, alcohol use disorder, dementia who was brought to the ED via EMS for evaluation of confusion. Pt had a fall at home. Imaging showed dens fx and L1 fx. Dx of severe sepsis.  Clinical Impression  Pt admitted with above diagnosis. Max assist for rolling and sidelying to sit. Mod assist for sit to stand. Pt unsteady in standing, he did not feel he could safely pivot to recliner, so returned to bed.  Pt currently with functional limitations due to the deficits listed below (see PT Problem List). Pt will benefit from skilled PT to increase their independence and safety with mobility to allow discharge to the venue listed below.       Follow Up Recommendations SNF;Supervision/Assistance - 24 hour;Supervision for mobility/OOB    Equipment Recommendations  None recommended by PT    Recommendations for Other Services       Precautions / Restrictions Precautions Precautions: Fall;Cervical Precaution Booklet Issued: No Required Braces or Orthoses: Cervical Brace Cervical Brace: Hard collar;Other (comment) (not specified in orders, presume at all times) Restrictions Weight Bearing Restrictions: No      Mobility  Bed Mobility Overal bed mobility: Needs Assistance Bed Mobility: Rolling;Sidelying to Sit Rolling: Max assist Sidelying to sit: Max assist Supine to sit: Mod assist     General bed mobility comments: verbal and manual cues for log roll, assist to initiate roll and then to raise trunk    Transfers Overall transfer level: Needs assistance Equipment used: Rolling walker (2 wheeled) Transfers: Sit to/from Stand Sit to Stand: Mod assist;From elevated surface         General transfer comment: strong  posterior lean in standing and fear of falling; high fall risk, did not attempt to pivot  Ambulation/Gait                Stairs            Wheelchair Mobility    Modified Rankin (Stroke Patients Only)       Balance Overall balance assessment: History of Falls;Needs assistance Sitting-balance support: Feet supported Sitting balance-Leahy Scale: Fair     Standing balance support: Bilateral upper extremity supported Standing balance-Leahy Scale: Poor Standing balance comment: reliant on UE support and mod A                             Pertinent Vitals/Pain Pain Assessment: Faces Faces Pain Scale: No hurt    Home Living Family/patient expects to be discharged to:: Private residence Living Arrangements: Spouse/significant other Available Help at Discharge: Family Type of Home: House Home Access: Stairs to enter   [employer] of Steps: back 2 steps then porch (no rails) main entrance has rails 6 steps. Home Layout: Two level;Able to live on main level with bedroom/bathroom Home Equipment: Gilford Rile - 2 wheels;Cane - single point;Bedside commode Additional Comments: sleeps primarily in recliner    Prior Function Level of Independence: Needs assistance   Gait / Transfers Assistance Needed: Per OT noted, pt ambulates with walker and spouse providing close assist. spouse also provides assist to power up to standing  ADL's / Homemaking Assistance Needed: Per OT noted, spouse assists with dressing, sponge bathing, and ambulating to bathroom  Comments: per case manager have children in  the area that also provide support to spouse to assist with patient ADLs     Hand Dominance   Dominant Hand: Right    Extremity/Trunk Assessment   Upper Extremity Assessment Upper Extremity Assessment: Defer to OT evaluation    Lower Extremity Assessment Lower Extremity Assessment: Generalized weakness;Difficult to assess due to impaired cognition     Cervical / Trunk Assessment Cervical / Trunk Assessment: Kyphotic  Communication   Communication: Expressive difficulties;Other (comment) (very wet gargling speech)  Cognition Arousal/Alertness: Lethargic Behavior During Therapy: WFL for tasks assessed/performed Overall Cognitive Status: No family/caregiver present to determine baseline cognitive functioning                                 General Comments: noted h/o dementia, no family present, pt did not respond to commands, unclear if he is confused and/or HOH      General Comments      Exercises     Assessment/Plan    PT Assessment Patient needs continued PT services  PT Problem List Decreased mobility;Decreased activity tolerance       PT Treatment Interventions Therapeutic activities;Functional mobility training;Therapeutic exercise;Patient/family education    PT Goals (Current goals can be found in the Care Plan section)  Acute Rehab PT Goals PT Goal Formulation: Patient unable to participate in goal setting Time For Goal Achievement: 11/01/20 Potential to Achieve Goals: Fair    Frequency Min 2X/week   Barriers to discharge        Co-evaluation               AM-PAC PT "6 Clicks" Mobility  Outcome Measure Help needed turning from your back to your side while in a flat bed without using bedrails?: A Lot Help needed moving from lying on your back to sitting on the side of a flat bed without using bedrails?: A Lot Help needed moving to and from a bed to a chair (including a wheelchair)?: A Lot Help needed standing up from a chair using your arms (e.g., wheelchair or bedside chair)?: A Lot Help needed to walk in hospital room?: Total Help needed climbing 3-5 steps with a railing? : Total 6 Click Score: 10    End of Session   Activity Tolerance: Patient limited by fatigue Patient left: in bed;with call bell/phone within reach Nurse Communication: Mobility status (IV came out, condom cath  not attached upon arrival to room) PT Visit Diagnosis: Difficulty in walking, not elsewhere classified (R26.2)    Time: 1213-1229 PT Time Calculation (min) (ACUTE ONLY): 16 min   Charges:   PT Evaluation $PT Eval Moderate Complexity: 1 Mod          Philomena Doheny PT 10/18/2020  Acute Rehabilitation Services Pager [phone] Office [phone]

## 2020-10-18 NOTE — Progress Notes (Signed)
Patient is agitated and yelling to get out of here. No PRN to administer. On-call notified via [place]. Will continue to monitor the patient.

## 2020-10-19 ENCOUNTER — Inpatient Hospital Stay (HOSPITAL_COMMUNITY): Payer: Medicare Other

## 2020-10-19 DIAGNOSIS — R6520 Severe sepsis without septic shock: Secondary | ICD-10-CM | POA: Diagnosis not present

## 2020-10-19 DIAGNOSIS — A419 Sepsis, unspecified organism: Secondary | ICD-10-CM | POA: Diagnosis not present

## 2020-10-19 LAB — CBC WITH DIFFERENTIAL/PLATELET
Abs Immature Granulocytes: 0.09 10*3/uL — ABNORMAL HIGH (ref 0.00–0.07)
Basophils Absolute: 0 10*3/uL (ref 0.0–0.1)
Basophils Relative: 1 %
Eosinophils Absolute: 0.1 10*3/uL (ref 0.0–0.5)
Eosinophils Relative: 2 %
HCT: 32.7 % — ABNORMAL LOW (ref 39.0–52.0)
Hemoglobin: 10.4 g/dL — ABNORMAL LOW (ref 13.0–17.0)
Immature Granulocytes: 1 %
Lymphocytes Relative: 10 %
Lymphs Abs: 0.9 10*3/uL (ref 0.7–4.0)
MCH: 32.7 pg (ref 26.0–34.0)
MCHC: 31.8 g/dL (ref 30.0–36.0)
MCV: 102.8 fL — ABNORMAL HIGH (ref 80.0–100.0)
Monocytes Absolute: 0.8 10*3/uL (ref 0.1–1.0)
Monocytes Relative: 9 %
Neutro Abs: 6.9 10*3/uL (ref 1.7–7.7)
Neutrophils Relative %: 77 %
Platelets: 248 10*3/uL (ref 150–400)
RBC: 3.18 MIL/uL — ABNORMAL LOW (ref 4.22–5.81)
RDW: 14.9 % (ref 11.5–15.5)
WBC: 8.8 10*3/uL (ref 4.0–10.5)
nRBC: 0 % (ref 0.0–0.2)

## 2020-10-19 LAB — COMPREHENSIVE METABOLIC PANEL
ALT: 15 U/L (ref 0–44)
AST: 32 U/L (ref 15–41)
Albumin: 2 g/dL — ABNORMAL LOW (ref 3.5–5.0)
Alkaline Phosphatase: 75 U/L (ref 38–126)
Anion gap: 5 (ref 5–15)
BUN: 17 mg/dL (ref 8–23)
CO2: 22 mmol/L (ref 22–32)
Calcium: 10.2 mg/dL (ref 8.9–10.3)
Chloride: 116 mmol/L — ABNORMAL HIGH (ref 98–111)
Creatinine, Ser: 0.7 mg/dL (ref 0.61–1.24)
GFR, Estimated: >60 mL/min (ref >60)
Glucose, Bld: 73 mg/dL (ref 70–99)
Potassium: 4 mmol/L (ref 3.5–5.1)
Sodium: 143 mmol/L (ref 135–145)
Total Bilirubin: 0.9 mg/dL (ref 0.3–1.2)
Total Protein: 4.8 g/dL — ABNORMAL LOW (ref 6.5–8.1)

## 2020-10-19 MED ORDER — LORAZEPAM 2 MG/ML IJ SOLN
0.5000 mg | Freq: Once | INTRAMUSCULAR | Status: AC
  Administered 2020-10-20: 0.5 mg via INTRAVENOUS
  Filled 2020-10-19: qty 1

## 2020-10-19 NOTE — Progress Notes (Signed)
  Speech Language Pathology Treatment: Dysphagia  Patient Details Name: Ian [name] MRN: [id number] DOB: 09-24-1925 Today's Date: 10/19/2020 Time: 7342-8768 SLP Time Calculation (min) (ACUTE ONLY): 22 min  Assessment / Plan / Recommendation Clinical Impression  SLP educated wife to results of MBS showing her flouro loops comparing his dysphagic swallow to normal using teach back.  Reviewed indication for pt to maintain strength of cough/expectoration to clear vallecular retention. Also advised to have pt stay upright after intake due to pt overtly coughing and expectorating viscous green tinged secretions to decrease aspiration risk.  Comfort feeding information reviewed in detail with possible modifications of diet from puree/nectar to nectar to tsp amounts of liquids.  Advised if pt is overtly coughing with intake to encourage him to cough and expectorate and cease po.  Thin liquids via tsp or toothette may allow comfort of liquids pt enjoys to consume for flavor with least discomfort.  All education completed, no follow up indicated. Thanks for allowing me to help care for this pt and his most pleasant spouse.    HPI HPI: Patient is a 87 y.o. male with PMH: CVA (2019), HTN, HLD, hypothyroidism, alcohol use disorder, dementia, who presented to ED via EMS for evaluation of confusion. Per wife's report at time of admission, patient had fall 2-3 days prior resulting in right shin injury. She found patient down on floor presumed to be from unwitnessed fall. Head CT did not reveal any acute intracranial abnormalities. CXR revealed chronic airspace opacities at lung bases favoring atelectasis or scarring. Pt is s/p MBS study and wife and pt agreeable to no feeding tube and accepting po intake with known aspiration risk.  SLP follow up to review MBS finding with pt and wife if pt is present and to post precautions.  RN reports pt doing better with nectar via cup. Approximately 1/2 of his lunch consumed.  Today  follow up to educate pt's wife regarding his dysphagia and comfort feeding indicated.      SLP Plan  All goals met       Recommendations  Diet recommendations: Dysphagia 1 (puree);Nectar-thick liquid (tsps of thin) Liquids provided via: No straw;Cup;Teaspoon Medication Administration: Via alternative means Supervision: Full supervision/cueing for compensatory strategies (comfort po) Compensations: Slow rate;Small sips/bites;Multiple dry swallows after each bite/sip;Other (Comment)                Oral Care Recommendations: Oral care QID Follow up Recommendations: Skilled Nursing facility;24 hour supervision/assistance SLP Visit Diagnosis: Dysphagia, oropharyngeal phase (R13.12) Plan: All goals met       GO                Macario Golds 10/19/2020, 11:29 AM  Kathleen Lime, MS [facility] SLP Acute Rehab Services Office [phone] Pager [phone]

## 2020-10-19 NOTE — Progress Notes (Signed)

## 2020-10-19 NOTE — Progress Notes (Signed)
Patient is agitated, anxious, yelling and difficult to redirect. No PRN to administer. On-call [place] notified via [place]. Will continue to monitor the patient.

## 2020-10-19 NOTE — TOC Progression Note (Signed)
Transition of Care [facility]) - Progression Note    Patient Details  Name: Ian [name] MRN: [id number] Date of Birth: 1926/03/11  Transition of Care [facility]) CM/SW [place], [place] Phone Number: 10/19/2020, 5:33 PM  Clinical Narrative:  Folowed up with wife after hearing from MD that she is open to referral to hospice facility.  Ms LeTrent-Steger confirmed, and I contacted Chrislyn King with [facility] to make referral. TOC will continue to follow during the course of hospitalization.      Expected Discharge Plan: [place] Barriers to Discharge: Hospice Bed not available  Expected Discharge Plan and Services Expected Discharge Plan: [place]   Discharge Planning Services: CM Consult   Living arrangements for the past 2 months: Single Family Home                                       Social Determinants of Health (SDOH) Interventions    Readmission Risk Interventions No flowsheet data found.

## 2020-10-19 NOTE — Progress Notes (Signed)
PROGRESS NOTE   Ian [name]  ZHY:[id number] DOB: 01-Jun-1926 DOA: 10/15/2020 PCP: Bernerd Limbo, MD  Brief Narrative:   87 year old home dwelling male History recurrent CVAs 10/25/2017 + prior bifrontal with chronic foot drop since that time --subarachnoid's/subdurals and EtOH abuse-probable dementia from both HTN Hypothyroid with complex thyroid nodule in the past Ascending aortic aneurysm in the past Prior upper GI bleed 11/09/2017 from duodenal ulcers  Admitted from home 10/15/2020 with significant confusion-does not drink heavily any further Found to have several falls recently, wife found him on the ground unkempt with feces and body fluids about On work-up found to have severe sepsis secondary to lower extremity cellulitis superimposed on significant lower extremity edema, C1-C2 instability with new type II dens fracture at C1 arch from prior to admission  Palliative care saw the patient in consult-family elected to pursue comfort trajectory after discussion   Assessment & Plan:   Severe sepsis lower extremity cellulitis prior to admission superimposed on anasarca Transition Ancef 1 g every 8-->cefepime for daily dosing--stop treatment on discharge home Type II dens fracture C1 arch Needs rigid cervical collar at all times for 6 weeks-poor surgical candidate per Dr. Venetia Constable neurosurgery who saw patient 2/20 Dysphagia, deemed unsafe to eat by SLP Severe aspiration risk based on evaluation 2/21, on 2/24 Goals are now comfort Recurrent CVAs last 1 10/25/2017 Not on prior to admission aspirin Holding at this time atorvastatin and Plavix in addition Prior upper GI bleed 10/2017 No anticoagulation given risk of bleeding Complex thyroid mass Stop Synthroid Poor candidate for work-up of mass in current state Ascending aortic aneurysm previously Poor candidate for work-up   DVT prophylaxis: Lovenox Code Status: DNR Family Communication: Long discussion at bedside with spouse Ian [name]  [phone] on 2/24 She is interested in discharge to hospice if possible Disposition:  Status is: Inpatient  Remains inpatient appropriate because:Hemodynamically unstable, Ongoing diagnostic testing needed not appropriate for outpatient work up and Unsafe d/c plan  Dispo:  Patient From: Home  Planned Disposition: Home with hospice  Expected discharge date: 2/26    Consultants:   Neurosurgery  Palliative care  Procedures: None  Antimicrobials: Currently cefepime   Subjective:  Confused  Speech therapist in the room explaining dysphagia per family-note significant risk of aspiration on their efforts Cannot obtain review of systems from patient himself  Objective: Vitals:   10/19/20 0355 10/19/20 0652 10/19/20 0815 10/19/20 1317  BP: 95/70   99/64  Pulse: 89   96  Resp: 18   20  Temp: 98.7 F (37.1 C)   97.6 F (36.4 C)  TempSrc:    Oral  SpO2: 93%  97% 100%  Weight:  83.8 kg    Height:        Intake/Output Summary (Last 24 hours) at 10/19/2020 1456 Last data filed at 10/19/2020 0636 Gross per 24 hour  Intake 999.28 ml  Output 350 ml  Net 649.28 ml   Filed Weights   10/17/20 0500 10/18/20 0500 10/19/20 0652  Weight: 80 kg 84.6 kg 83.8 kg    Examination:  Coherent x1 S1-S2 no murmur no rub no gallop Abdomen soft nontender no rebound no guarding Lower extremities in PR AFO-wounds not examined Moving 4 limbs grossly  Data Reviewed: personally reviewed   CBC    Component Value Date/Time   WBC 8.8 10/19/2020 0402   RBC 3.18 (L) 10/19/2020 0402   HGB 10.4 (L) 10/19/2020 0402   HCT 32.7 (L) 10/19/2020 0402   PLT 248 10/19/2020 0402  MCV 102.8 (H) 10/19/2020 0402   MCH 32.7 10/19/2020 0402   MCHC 31.8 10/19/2020 0402   RDW 14.9 10/19/2020 0402   LYMPHSABS 0.9 10/19/2020 0402   MONOABS 0.8 10/19/2020 0402   EOSABS 0.1 10/19/2020 0402   BASOSABS 0.0 10/19/2020 0402   CMP Latest Ref Rng & Units 10/19/2020 10/18/2020 10/17/2020  Glucose 70 - 99 mg/dL  73 101(H) 93  BUN 8 - 23 mg/dL 17 16 16   Creatinine 0.61 - 1.24 mg/dL 0.70 0.66 0.65  Sodium 135 - 145 mmol/L 143 143 143  Potassium 3.5 - 5.1 mmol/L 4.0 3.8 4.2  Chloride 98 - 111 mmol/L 116(H) 114(H) 113(H)  CO2 22 - 32 mmol/L 22 21(L) 17(L)  Calcium 8.9 - 10.3 mg/dL 10.2 10.1 9.8  Total Protein 6.5 - 8.1 g/dL 4.8(L) 4.7(L) 4.9(L)  Total Bilirubin 0.3 - 1.2 mg/dL 0.9 0.8 0.7  Alkaline Phos 38 - 126 U/L 75 87 91  AST 15 - 41 U/L 32 35 40  ALT 0 - 44 U/L 15 20 21      Radiology Studies: DG CHEST PORT 1 VIEW  Result Date: 10/19/2020 CLINICAL DATA:  Pneumonia. EXAM: PORTABLE CHEST 1 VIEW COMPARISON:  Chest x-ray 10/16/2020, 10/15/2020. FINDINGS: Patient rotated to the right. Surgical clips and sutures right upper chest again noted. Aorta is again noted be tortuous. Heart size stable. Low lung volumes. Persistent bibasilar atelectasis and infiltrates with slight progression from prior exam. Small bilateral pleural effusions. Prominent skin fold on the left. No pneumothorax. IMPRESSION: Low lung volumes with persistent bibasilar atelectasis and infiltrates with slight progression from prior exam. Small bilateral pleural effusions. Electronically Signed   By: Marcello Moores  Register   On: 10/19/2020 05:46   DG Swallowing Func-Speech Pathology  Result Date: 10/17/2020 Objective Swallowing Evaluation: Type of Study: MBS-Modified Barium Swallow Study  Patient Details Name: Ian [name] MRN: [id number] Date of Birth: 1925/11/30 Today's Date: 10/17/2020 Time: SLP Start Time (ACUTE ONLY): 1551 -SLP Stop Time (ACUTE ONLY): 1630 SLP Time Calculation (min) (ACUTE ONLY): 39 min Past Medical History: Past Medical History: Diagnosis Date . Benign neoplasm of parathyroid gland 03/31/2013 . Calculus of kidney 03/31/2013  Overview:    see ER note  . Hypertension  . Leukocytosis 10/26/2017 . Loss of weight 10/26/2017 . Multiple leg contusions, unspecified laterality, sequela 10/26/2017 . Scalp hematoma, subsequent encounter 10/26/2017  . Stroke [facility])  Past Surgical History: Past Surgical History: Procedure Laterality Date . ESOPHAGOGASTRODUODENOSCOPY (EGD) WITH PROPOFOL N/A 11/09/2017  Procedure: ESOPHAGOGASTRODUODENOSCOPY (EGD) WITH PROPOFOL;  Surgeon: Laurence Spates, MD;  Location: [place];  Service: Endoscopy;  Laterality: N/A; HPI: Patient is a 87 y.o. male with PMH: CVA (2019), HTN, HLD, hypothyroidism, alcohol use disorder, dementia, who presented to ED via EMS for evaluation of confusion. Per wife's report at time of admission, patient had fall 2-3 days prior resulting in right shin injury. She found patient down on floor presumed to be from unwitnessed fall. Head CT did not reveal any acute intracranial abnormalities. CXR revealed chronic airspace opacities at lung bases favoring atelectasis or scarring.  Subjective: awake in chair in flouro Assessment / Plan / Recommendation CHL IP CLINICAL IMPRESSIONS 10/17/2020 Clinical Impression Mr Gillespie presents with moderately severe oropharyngeal dysphagia with sensorimotor deficits.  He was tested with hard cervical collar in place per neurosurgery due to his new fracture to anterior arch of C1.  Oropharyngeal musculature weakness results in impaired oral control with delayed oral transiting due to lingual pumping A-P, decreased propulsion.  Delay was most notable  with solids/puree - which pt required 12 seconds with excessive lingual pumping to transit.  Pharyngeal swallow was weak impairing epiglottic deflection, laryngeal elevation, pharyngeal contraction resulting in retention *at vallecular region more than pyriform sinus.  Cues for "strong swallow" clinically helpful to improve muscle recruitment however doubtful this will be replicated consistently during meals and suspect pt will fatigue.  Cues to "[facility]" were effective to clear large amount of vallecular and tongue base retention that pt does not sense. Pt actually stated "I hock all of the time" to this SLP - suspected some component  of chronic dysphagia.  Fortunately, he can clear up to 80% of vallecular retention with great effort - and suspect this has been vital in protecting his airway.   Fatigue factor noted as when testing concluded pt continued with barium retention despite efforts to clear.  His pharyngeal swallow is also delayed with puree/cracker mixture barium aggregating in vallecular space prior to swallow trigger. Once during testing,  pt stated "OK girls" - indicating he had swallowed when pudding was retained at tongue base and vallecular space without pt swallowing.   SLP provided pt with necter liquid to facilitate swallow.  Given pt's decreased oral control and overt aspiration observed with thin water at bedside, recommend nectar liquids -full liquids/nectar diet initially with strategies to mitigate his risk. Advise he be allowed ice chips and tsp amounts of thin water between meals after oral care.  Highly advise consider a palliative consult for this patient given his deconditioning, current illness, level of dysphagia with concern for meeting nutritional needs and aspiration risk.  Pt admits his swallowing ability is more impaired that baseline.   SLP had explained to Ian [name], pt's wife, that goal of testing is to determine if any consistency and strategy may mitigate aspiration but not prevent it.  Wife reports not desire for feeding tube.  Will follow for treatment, family education and readiness for dietary advancement.  Pt's wife was not present during evaluation but was invited to attend.   SLP Visit Diagnosis Dysphagia, oropharyngeal phase (R13.12) Attention and concentration deficit following -- Frontal lobe and executive function deficit following -- Impact on safety and function Severe aspiration risk;Risk for inadequate nutrition/hydration   CHL IP TREATMENT RECOMMENDATION 10/17/2020 Treatment Recommendations Therapy as outlined in treatment plan below   Prognosis 10/17/2020 Prognosis for Safe Diet Advancement Fair  Barriers to Reach Goals Cognitive deficits;Time post onset Barriers/Prognosis Comment -- CHL IP DIET RECOMMENDATION 10/17/2020 SLP Diet Recommendations Nectar thick liquid;Free water protocol after oral care Liquid Administration via Cup Medication Administration Crushed with puree Compensations Slow rate;Small sips/bites;Multiple dry swallows after each bite/sip;Other (Comment) Postural Changes Seated upright at 90 degrees;Remain semi-upright after after feeds/meals (Comment)   CHL IP OTHER RECOMMENDATIONS 10/17/2020 Recommended Consults -- Oral Care Recommendations Oral care BID Other Recommendations --   CHL IP FOLLOW UP RECOMMENDATIONS 10/17/2020 Follow up Recommendations Skilled Nursing facility;24 hour supervision/assistance   CHL IP FREQUENCY AND DURATION 10/17/2020 Speech Therapy Frequency (ACUTE ONLY) min 2x/week Treatment Duration 1 week      CHL IP ORAL PHASE 10/17/2020 Oral Phase Impaired Oral - Pudding Teaspoon -- Oral - Pudding Cup -- Oral - Honey Teaspoon -- Oral - Honey Cup -- Oral - Nectar Teaspoon Decreased bolus cohesion;Premature spillage;Weak lingual manipulation;Reduced posterior propulsion Oral - Nectar Cup Decreased bolus cohesion;Reduced posterior propulsion;Premature spillage;Weak lingual manipulation Oral - Nectar Straw Decreased bolus cohesion;Reduced posterior propulsion;Premature spillage;Weak lingual manipulation Oral - Thin Teaspoon Decreased bolus cohesion;Reduced posterior propulsion;Premature spillage;Weak lingual manipulation Oral -  Thin Cup Decreased bolus cohesion;Reduced posterior propulsion;Weak lingual manipulation;Right anterior bolus loss Oral - Thin Straw Reduced posterior propulsion;Weak lingual manipulation;Lingual pumping Oral - Puree Decreased bolus cohesion;Reduced posterior propulsion;Weak lingual manipulation;Lingual pumping Oral - Mech Soft Decreased bolus cohesion;Reduced posterior propulsion;Weak lingual manipulation;Impaired mastication;Lingual pumping Oral - Regular  -- Oral - Multi-Consistency -- Oral - Pill -- Oral Phase - Comment --  CHL IP PHARYNGEAL PHASE 10/17/2020 Pharyngeal Phase Impaired Pharyngeal- Pudding Teaspoon -- Pharyngeal -- Pharyngeal- Pudding Cup -- Pharyngeal -- Pharyngeal- Honey Teaspoon -- Pharyngeal -- Pharyngeal- Honey Cup -- Pharyngeal -- Pharyngeal- Nectar Teaspoon Delayed swallow initiation-vallecula;Reduced epiglottic inversion;Reduced laryngeal elevation;Reduced airway/laryngeal closure;Pharyngeal residue - valleculae;Pharyngeal residue - pyriform;Reduced tongue base retraction Pharyngeal -- Pharyngeal- Nectar Cup Pharyngeal residue - valleculae;Pharyngeal residue - pyriform;Delayed swallow initiation-vallecula;Reduced epiglottic inversion;Reduced anterior laryngeal mobility;Reduced pharyngeal peristalsis;Reduced laryngeal elevation;Reduced tongue base retraction;Penetration/Aspiration during swallow;Penetration/Apiration after swallow Pharyngeal Material enters airway, remains ABOVE vocal cords and not ejected out Pharyngeal- Nectar Straw Delayed swallow initiation-vallecula;Reduced epiglottic inversion;Reduced anterior laryngeal mobility;Reduced laryngeal elevation;Reduced airway/laryngeal closure;Reduced tongue base retraction;Penetration/Aspiration during swallow;Penetration/Apiration after swallow;Trace aspiration;Reduced pharyngeal peristalsis Pharyngeal Material enters airway, passes BELOW cords and not ejected out despite cough attempt by patient Pharyngeal- Thin Teaspoon Delayed swallow initiation-vallecula;Reduced laryngeal elevation;Reduced airway/laryngeal closure;Reduced tongue base retraction;Reduced epiglottic inversion;Reduced pharyngeal peristalsis;Penetration/Aspiration during swallow;Penetration/Apiration after swallow;Trace aspiration Pharyngeal Material enters airway, passes BELOW cords without attempt by patient to eject out (silent aspiration) Pharyngeal- Thin Cup Delayed swallow initiation-vallecula;Reduced epiglottic  inversion;Reduced pharyngeal peristalsis;Reduced anterior laryngeal mobility;Reduced laryngeal elevation;Reduced tongue base retraction;Penetration/Aspiration before swallow;Penetration/Aspiration during swallow;Moderate aspiration;Pharyngeal residue - valleculae;Pharyngeal residue - pyriform Pharyngeal Material enters airway, passes BELOW cords without attempt by patient to eject out (silent aspiration) Pharyngeal- Thin Straw Delayed swallow initiation-vallecula;Reduced pharyngeal peristalsis;Reduced epiglottic inversion;Reduced anterior laryngeal mobility;Reduced laryngeal elevation;Reduced airway/laryngeal closure;Reduced tongue base retraction;Pharyngeal residue - valleculae;Pharyngeal residue - pyriform Pharyngeal Material enters airway, passes BELOW cords and not ejected out despite cough attempt by patient Pharyngeal- Puree Delayed swallow initiation-vallecula;Reduced epiglottic inversion;Reduced pharyngeal peristalsis;Reduced laryngeal elevation;Reduced airway/laryngeal closure;Reduced tongue base retraction;Pharyngeal residue - valleculae;Pharyngeal residue - pyriform Pharyngeal Material does not enter airway Pharyngeal- Mechanical Soft Delayed swallow initiation-vallecula;Reduced pharyngeal peristalsis;Reduced epiglottic inversion;Reduced anterior laryngeal mobility;Reduced laryngeal elevation;Reduced airway/laryngeal closure;Reduced tongue base retraction;Pharyngeal residue - valleculae;Pharyngeal residue - pyriform Pharyngeal Material does not enter airway Pharyngeal- Regular -- Pharyngeal -- Pharyngeal- Multi-consistency -- Pharyngeal -- Pharyngeal- Pill -- Pharyngeal -- Pharyngeal Comment --  CHL IP CERVICAL ESOPHAGEAL PHASE 10/17/2020 Cervical Esophageal Phase Impaired Pudding Teaspoon -- Pudding Cup -- Honey Teaspoon -- Honey Cup -- Nectar Teaspoon -- Nectar Cup -- Nectar Straw -- Thin Teaspoon -- Thin Cup -- Thin Straw -- Puree -- Mechanical Soft -- Regular -- Multi-consistency -- Pill -- Cervical  Esophageal Comment Prominent Cricopharyngeus present - observed with nectar thick liquids.   Secretions and barium retained at pyriform sinus region.  Pt observed to frequently belch during testing without observation of backflowed barium into pharynx. Macario Golds 10/17/2020, 5:35 PM                Scheduled Meds: . enoxaparin (LOVENOX) injection  40 mg Subcutaneous Q24H  . ipratropium-albuterol  3 mL Nebulization BID  . [START ON 10/20/2020] levothyroxine  25 mcg Intravenous Daily  . pantoprazole (PROTONIX) IV  40 mg Intravenous Q24H  . sodium chloride flush  3 mL Intravenous Q12H  . sodium chloride HYPERTONIC  4 mL Nebulization Daily   Continuous Infusions: . sodium chloride 250 mL (10/18/20 2235)  . ceFEPime (MAXIPIME) IV 2 g (10/19/20 0900)  LOS: 4 days   Time spent 25  Nita Sells, MD [facility] To contact the attending provider between 7A-7P or the covering provider during after hours 7P-7A, please log into the web site [email or web address] and access using universal [place] password for that web site. If you do not have the password, please call the hospital operator.  10/19/2020, 2:56 PM

## 2020-10-20 DIAGNOSIS — A419 Sepsis, unspecified organism: Secondary | ICD-10-CM | POA: Diagnosis not present

## 2020-10-20 DIAGNOSIS — R6520 Severe sepsis without septic shock: Secondary | ICD-10-CM | POA: Diagnosis not present

## 2020-10-20 DIAGNOSIS — L03116 Cellulitis of left lower limb: Secondary | ICD-10-CM | POA: Diagnosis not present

## 2020-10-20 DIAGNOSIS — Z7189 Other specified counseling: Secondary | ICD-10-CM | POA: Diagnosis not present

## 2020-10-20 DIAGNOSIS — S12000B Unspecified displaced fracture of first cervical vertebra, initial encounter for open fracture: Secondary | ICD-10-CM | POA: Diagnosis not present

## 2020-10-20 LAB — COMPREHENSIVE METABOLIC PANEL
ALT: 12 U/L (ref 0–44)
AST: 30 U/L (ref 15–41)
Albumin: 2 g/dL — ABNORMAL LOW (ref 3.5–5.0)
Alkaline Phosphatase: 76 U/L (ref 38–126)
Anion gap: 8 (ref 5–15)
BUN: 20 mg/dL (ref 8–23)
CO2: 21 mmol/L — ABNORMAL LOW (ref 22–32)
Calcium: 9.9 mg/dL (ref 8.9–10.3)
Chloride: 116 mmol/L — ABNORMAL HIGH (ref 98–111)
Creatinine, Ser: 0.8 mg/dL (ref 0.61–1.24)
GFR, Estimated: >60 mL/min (ref >60)
Glucose, Bld: 65 mg/dL — ABNORMAL LOW (ref 70–99)
Potassium: 3.7 mmol/L (ref 3.5–5.1)
Sodium: 145 mmol/L (ref 135–145)
Total Bilirubin: 1.1 mg/dL (ref 0.3–1.2)
Total Protein: 4.9 g/dL — ABNORMAL LOW (ref 6.5–8.1)

## 2020-10-20 LAB — CBC WITH DIFFERENTIAL/PLATELET
Abs Immature Granulocytes: 0.13 10*3/uL — ABNORMAL HIGH (ref 0.00–0.07)
Basophils Absolute: 0.1 10*3/uL (ref 0.0–0.1)
Basophils Relative: 1 %
Eosinophils Absolute: 0.1 10*3/uL (ref 0.0–0.5)
Eosinophils Relative: 2 %
HCT: 32.3 % — ABNORMAL LOW (ref 39.0–52.0)
Hemoglobin: 10.4 g/dL — ABNORMAL LOW (ref 13.0–17.0)
Immature Granulocytes: 2 %
Lymphocytes Relative: 15 %
Lymphs Abs: 1.1 10*3/uL (ref 0.7–4.0)
MCH: 33.1 pg (ref 26.0–34.0)
MCHC: 32.2 g/dL (ref 30.0–36.0)
MCV: 102.9 fL — ABNORMAL HIGH (ref 80.0–100.0)
Monocytes Absolute: 0.9 10*3/uL (ref 0.1–1.0)
Monocytes Relative: 12 %
Neutro Abs: 4.9 10*3/uL (ref 1.7–7.7)
Neutrophils Relative %: 68 %
Platelets: 234 10*3/uL (ref 150–400)
RBC: 3.14 MIL/uL — ABNORMAL LOW (ref 4.22–5.81)
RDW: 15.1 % (ref 11.5–15.5)
WBC: 7.1 10*3/uL (ref 4.0–10.5)
nRBC: 0 % (ref 0.0–0.2)

## 2020-10-20 MED ORDER — CEPHALEXIN 500 MG PO CAPS
500.0000 mg | ORAL_CAPSULE | Freq: Three times a day (TID) | ORAL | Status: DC
  Administered 2020-10-20 – 2020-10-23 (×11): 500 mg via ORAL
  Filled 2020-10-20 (×10): qty 1

## 2020-10-20 MED ORDER — IPRATROPIUM-ALBUTEROL 0.5-2.5 (3) MG/3ML IN SOLN: 3.0000 mL | Freq: Four times a day (QID) | RESPIRATORY_TRACT | Status: DC | PRN

## 2020-10-20 MED ORDER — LORAZEPAM 2 MG/ML IJ SOLN: 0.5000 mg | Freq: Four times a day (QID) | INTRAMUSCULAR | Status: DC | PRN

## 2020-10-20 NOTE — Progress Notes (Signed)
OT Cancellation Note  Patient Details Name: Ian [name] MRN: [id number] DOB: [birth year]   Cancelled Treatment:    Reason Eval/Treat Not Completed: Other (comment). Family is pursuing hospice house. OT will sign off. Please re-consult if patient medically improves or GOC change.  Britten Seyfried L Draxton Luu 10/20/2020, 10:07 AM

## 2020-10-20 NOTE — Progress Notes (Signed)
Manufacturing engineer [facility]) Hospital Liaison note.    Chart and pt information have been reviewed by [facility] physician who feels life expectancy at this time is >2 weeks, and, as such, is not eligible for inpatient hospice at [facility]. [place] liaisons will continue as needed. Please do not hesitate to call with questions.    Thank you for the opportunity to participate in this patient's care.  Domenic Moras, BSN, RN [facility] Liaison (listed on [place] under Hospice/[facility])    [phone]  (24h on call)

## 2020-10-20 NOTE — Progress Notes (Addendum)
Daily Progress Note   Patient Name: Ian [name]       Date: 10/20/2020 DOB: 1926-05-08  Age: 87 y.o. MRN#: [id number] Attending Physician: Nita Sells, MD Primary Care Physician: Bernerd Limbo, MD Admit Date: 10/15/2020  Reason for Consultation/Follow-up: Establishing goals of care  Subjective: Discussed case with transition of care as well as Dr. Verlon Au.  I saw and examined [name].  He was awake and alert and lying in bed in no distress but was easily agitated whenever I saw him.  He seemed confused but also appeared is very hard of hearing so I am not sure how much he is really able to understand conversation.  Denied needs or complaints.  I called and spoke with patient's wife, Ian [name].  She reports that she has been in discussion with care team regarding options for care moving forward.  Overall, she expressed seeing that he is continuing to decline and is agreeable to evaluation for potential transition to residential hospice for end-of-life care.  Discussed that he had been on antibiotics prior, but she was in agreement with stopping these today as he is going to continue to deal with cellulitis and aspiration regardless of interventions moving forward.  I called and spoke with hospice liaison regarding his case.  Currently, there is question if he would meet criteria for residential hospice.  Discussed plan for stopping antibiotics and that I am planning on meeting with his wife tomorrow morning.  Liaison will let us know if there is further input from hospice MD regarding acceptance to [facility] for end-of-life care.  Length of Stay: 5  Current Medications: Scheduled Meds:  . cephALEXin  500 mg Oral Q8H  . sodium chloride flush  3 mL Intravenous Q12H    Continuous  Infusions: . sodium chloride 250 mL (10/18/20 2235)    PRN Meds: sodium chloride, acetaminophen **OR** acetaminophen, ipratropium-albuterol, LORazepam, [DISCONTINUED] ondansetron **OR** ondansetron (ZOFRAN) IV, polyvinyl alcohol, Resource ThickenUp Clear  Physical Exam      General: Alert, awake, in no acute distress. Limited engagement. HEENT: C collar in place Heart: Regular rate and rhythm. No murmur appreciated. Lungs: Fair air movement Abdomen: Soft, nontender, nondistended, positive bowel sounds.  Ext: No significant edema Skin: Warm and dry      Vital Signs: BP 102/77 (BP Location: Right Arm)  Pulse 93   Temp (!) 97.5 F (36.4 C) (Oral)   Resp 16   Ht 5\' 11"  (1.803 m)   Wt 84.7 kg   SpO2 99%   BMI 26.04 kg/m  SpO2: SpO2: 99 % O2 Device: O2 Device: Nasal Cannula O2 Flow Rate: O2 Flow Rate (L/min): 2 L/min  Intake/output summary:   Intake/Output Summary (Last 24 hours) at 10/20/2020 1932 Last data filed at 10/20/2020 1300 Gross per 24 hour  Intake 0 ml  Output 550 ml  Net -550 ml   LBM: Last BM Date: 10/15/20 Baseline Weight: Weight: 81.6 kg Most recent weight: Weight: 84.7 kg       Palliative Assessment/Data:      Patient Active Problem List   Diagnosis Date Noted  . Fracture of anterior arch of C1 ([place]) 10/15/2020  . Severe sepsis ([place]) 10/15/2020  . Cellulitis of left lower extremity 10/15/2020  . Acute metabolic encephalopathy 99/83/3825  . Anemia 02/12/2018  . Duodenal ulcer 02/12/2018  . History of stroke 02/12/2018  . ETOH abuse 02/12/2018  . Right foot drop 02/12/2018  . Dyslipidemia 11/16/2017  . GI bleed due to NSAIDs 11/09/2017  . Melena 11/09/2017  . Acute blood loss anemia 11/09/2017  . GI bleed 11/09/2017  . Peptic ulcer disease   . Dysphagia, post-stroke   . Essential hypertension   . Embolic cerebral infarction ([place]) 10/29/2017  . Dementia without behavioral disturbance ([place])   . Benign essential HTN   . Diastolic  dysfunction   . Hypothyroidism   . Hypokalemia   . Altered mental status   . Pressure injury of skin 10/26/2017  . Loss of weight 10/26/2017  . Elevated troponin I level 10/26/2017  . Leukocytosis 10/26/2017  . Multiple leg contusions, unspecified laterality, sequela 10/26/2017  . Cognitive impairment 10/26/2017  . Scalp hematoma, subsequent encounter 10/26/2017  . Head injury 10/26/2017  . Fall at home, sequela 10/26/2017  . Right BBB/left ant fasc block 10/26/2017  . Acute confusional state 10/26/2017  . Dizziness and giddiness 10/26/2017  . Cerebral infarction [facility]), right occipital lobe 10/26/2017  . Elevated AST (SGOT) 10/26/2017  . Shoulder injury, right, subsequent encounter 10/26/2017  . Paranasal sinus disease 10/26/2017  . Maxillary sinusitis 10/26/2017  . Ethmoid sinusitis 10/26/2017  . Sphenoid sinusitis 10/26/2017  . Rhabdomyolysis 10/25/2017  . Neuropathy 03/31/2013    Palliative Care Assessment & Plan   Patient Profile: 87 y.o. male with multiple medical problems including multiple CVAs (2019), ETOH abuse, foot drop, hypertension, aortic aneurysm, and questionable dementia. Patient presented from home with concerns of altered mental status and found by wife on floor s/p unwitnessed fall. Work-up identified severe sepesis, and fracture through the anterior arch of C1.  Palliative consulted for [place].  Recommendations/Plan: Discussed with patient's wife as well as liaison from hospice.  Reviewed plan for discontinuation of antibiotics and continuing to see how he does overnight.  I am concerned he will continue to have aspiration regardless of other interventions moving forward.  Hospice is evaluating for potential residential hospice.  If he qualifies, this will be likely pathway forward per my discussion with his wife today.  I discussed with her need to consider backup plan if he is determined not to be a candidate for residential hospice.  I am planning on meeting with  her tomorrow morning to continue discussion.   Code Status:    Code Status Orders  (From admission, onward)         Start  Ordered   10/17/20 1100  Do not attempt resuscitation (DNR)  Continuous       Question Answer Comment  In the event of cardiac or respiratory ARREST Do not call a "code blue"   In the event of cardiac or respiratory ARREST Do not perform Intubation, CPR, defibrillation or ACLS   In the event of cardiac or respiratory ARREST Use medication by any route, position, wound care, and other measures to relive pain and suffering. May use oxygen, suction and manual treatment of airway obstruction as needed for comfort.      10/17/20 1059        Code Status History    Date Active Date Inactive Code Status Order ID Comments User Context   10/15/2020 2039 10/17/2020 1059 Full Code [id number]  Lenore Cordia, MD ED   11/09/2017 0113 11/11/2017 2101 Full Code [id number]  Toy Baker, MD Inpatient   10/29/2017 1737 11/09/2017 0054 Full Code [id number]  Cathlyn Parsons, PA-C Inpatient   10/29/2017 1737 10/29/2017 1737 Full Code [id number]  Cathlyn Parsons, PA-C Inpatient   10/25/2017 1908 10/29/2017 1724 Full Code [id number]  Everrett Coombe, MD ED   Advance Care Planning Activity      Prognosis:  Guarded/poor  Discharge Planning: To Be Determined  Care plan was discussed with patient, wife, Dr. Verlon Au, and hospice liaison  Thank you for allowing the Palliative Medicine Team to assist in the care of this patient.   Time In: 1400 Time Out: 1440 Total Time 40 Prolonged Time Billed No      Greater than 50%  of this time was spent counseling and coordinating care related to the above assessment and plan.  Micheline Rough, MD  Please contact Palliative Medicine Team phone at [phone] for questions and concerns.

## 2020-10-20 NOTE — Progress Notes (Signed)
PROGRESS NOTE   Ian [name]  YDX:[id number] DOB: Dec 31, 1925 DOA: 10/15/2020 PCP: Bernerd Limbo, MD  Brief Narrative:   87 year old home dwelling male History recurrent CVAs 10/25/2017 + prior bifrontal with chronic foot drop since that time --subarachnoid's/subdurals and EtOH abuse-probable dementia from both HTN Hypothyroid with complex thyroid nodule in the past Ascending aortic aneurysm in the past Prior upper GI bleed 11/09/2017 from duodenal ulcers  Admitted from home 10/15/2020 with significant confusion-does not drink heavily any further Found to have several falls recently, wife found him on the ground unkempt with feces and body fluids about On work-up found to have severe sepsis secondary to lower extremity cellulitis superimposed on significant lower extremity edema, C1-C2 instability with new type II dens fracture at C1 arch from prior to admission  Palliative care saw the patient in consult-family elected to pursue comfort trajectory after discussion   Assessment & Plan:   Severe sepsis lower extremity cellulitis prior to admission superimposed on anasarca Transition Ancef 1 g every 8-->cefepime---> Keflex to complete on 10/24/2020 Type II dens fracture C1 arch Needs rigid cervical collar at all times for 6 weeks-poor surgical candidate per Dr. Venetia Constable neurosurgery who saw patient 2/20 Dysphagia, deemed unsafe to eat by SLP Severe aspiration risk based on evaluation 2/21, on 2/24 Goals are now comfort related Recurrent CVAs last 1 10/25/2017 Not on prior to admission aspirin Holding at this time atorvastatin and Plavix in addition Prior upper GI bleed 10/2017 No anticoagulation given risk of bleeding Complex thyroid mass Stop Synthroid Poor candidate for work-up of mass in current state Ascending aortic aneurysm previously Poor candidate for work-up   DVT prophylaxis: Lovenox Code Status: DNR Family Communication:   discussion at bedside with spouse [name] [phone]  on 2/25 She is interested in discharge to hospice  Patient has had some further mental improvement and clarity-might not be appropriate for hospice facility placement-we will reevaluate over the weekend-may need to go to skilled with palliative care instead Disposition:  Status is: Inpatient  Remains inpatient appropriate because:Hemodynamically unstable, Ongoing diagnostic testing needed not appropriate for outpatient work up and Unsafe d/c plan  Dispo:  Patient From: Home  Planned Disposition: See above discussion  Expected discharge date: 2/26    Consultants:   Neurosurgery  Palliative care  Procedures: None  Antimicrobials: Currently cefepime   Subjective:  Less confused Verbalizing that he is in the hospital 2 blocks away from his home and is able to orient somewhat better according to his wife who is at bedside Does not complain of any overt pain  Objective: Vitals:   10/20/20 0500 10/20/20 0525 10/20/20 0738 10/20/20 1354  BP:  122/67  102/77  Pulse:  91  93  Resp:  (!) 21  16  Temp:  98 F (36.7 C)  (!) 97.5 F (36.4 C)  TempSrc:    Oral  SpO2:  96% 96% 99%  Weight: 84.7 kg     Height:        Intake/Output Summary (Last 24 hours) at 10/20/2020 1449 Last data filed at 10/20/2020 1300 Gross per 24 hour  Intake 0 ml  Output 550 ml  Net -550 ml   Filed Weights   10/18/20 0500 10/19/20 0652 10/20/20 0500  Weight: 84.6 kg 83.8 kg 84.7 kg    Examination:  Coherent x 3 more engaging today Chest is clear to anterior lung fields S1-S2 no murmur no rub no gallop Abdomen soft no rebound no guarding  Data Reviewed: personally reviewed   CBC  Component Value Date/Time   WBC 7.1 10/20/2020 0346   RBC 3.14 (L) 10/20/2020 0346   HGB 10.4 (L) 10/20/2020 0346   HCT 32.3 (L) 10/20/2020 0346   PLT 234 10/20/2020 0346   MCV 102.9 (H) 10/20/2020 0346   MCH 33.1 10/20/2020 0346   MCHC 32.2 10/20/2020 0346   RDW 15.1 10/20/2020 0346   LYMPHSABS 1.1  10/20/2020 0346   MONOABS 0.9 10/20/2020 0346   EOSABS 0.1 10/20/2020 0346   BASOSABS 0.1 10/20/2020 0346   CMP Latest Ref Rng & Units 10/20/2020 10/19/2020 10/18/2020  Glucose 70 - 99 mg/dL 65(L) 73 101(H)  BUN 8 - 23 mg/dL 20 17 16   Creatinine 0.61 - 1.24 mg/dL 0.80 0.70 0.66  Sodium 135 - 145 mmol/L 145 143 143  Potassium 3.5 - 5.1 mmol/L 3.7 4.0 3.8  Chloride 98 - 111 mmol/L 116(H) 116(H) 114(H)  CO2 22 - 32 mmol/L 21(L) 22 21(L)  Calcium 8.9 - 10.3 mg/dL 9.9 10.2 10.1  Total Protein 6.5 - 8.1 g/dL 4.9(L) 4.8(L) 4.7(L)  Total Bilirubin 0.3 - 1.2 mg/dL 1.1 0.9 0.8  Alkaline Phos 38 - 126 U/L 76 75 87  AST 15 - 41 U/L 30 32 35  ALT 0 - 44 U/L 12 15 20      Radiology Studies: DG CHEST PORT 1 VIEW  Result Date: 10/19/2020 CLINICAL DATA:  Pneumonia. EXAM: PORTABLE CHEST 1 VIEW COMPARISON:  Chest x-ray 10/16/2020, 10/15/2020. FINDINGS: Patient rotated to the right. Surgical clips and sutures right upper chest again noted. Aorta is again noted be tortuous. Heart size stable. Low lung volumes. Persistent bibasilar atelectasis and infiltrates with slight progression from prior exam. Small bilateral pleural effusions. Prominent skin fold on the left. No pneumothorax. IMPRESSION: Low lung volumes with persistent bibasilar atelectasis and infiltrates with slight progression from prior exam. Small bilateral pleural effusions. Electronically Signed   By: Marcello Moores  Register   On: 10/19/2020 05:46     Scheduled Meds: . sodium chloride flush  3 mL Intravenous Q12H   Continuous Infusions: . sodium chloride 250 mL (10/18/20 2235)     LOS: 5 days   Time spent [place], MD [facility] To contact the attending provider between 7A-7P or the covering provider during after hours 7P-7A, please log into the web site [email or web address] and access using universal [place] password for that web site. If you do not have the password, please call the hospital operator.  10/20/2020, 2:49  PM

## 2020-10-20 NOTE — Progress Notes (Signed)

## 2020-10-21 DIAGNOSIS — F0390 Unspecified dementia without behavioral disturbance: Secondary | ICD-10-CM | POA: Diagnosis not present

## 2020-10-21 DIAGNOSIS — Z7189 Other specified counseling: Secondary | ICD-10-CM | POA: Diagnosis not present

## 2020-10-21 DIAGNOSIS — R6520 Severe sepsis without septic shock: Secondary | ICD-10-CM | POA: Diagnosis not present

## 2020-10-21 DIAGNOSIS — Z515 Encounter for palliative care: Secondary | ICD-10-CM | POA: Diagnosis not present

## 2020-10-21 DIAGNOSIS — A419 Sepsis, unspecified organism: Secondary | ICD-10-CM | POA: Diagnosis not present

## 2020-10-21 LAB — CBC WITH DIFFERENTIAL/PLATELET
Abs Immature Granulocytes: 0.12 10*3/uL — ABNORMAL HIGH (ref 0.00–0.07)
Basophils Absolute: 0.1 10*3/uL (ref 0.0–0.1)
Basophils Relative: 1 %
Eosinophils Absolute: 0.4 10*3/uL (ref 0.0–0.5)
Eosinophils Relative: 4 %
HCT: 32.2 % — ABNORMAL LOW (ref 39.0–52.0)
Hemoglobin: 10.4 g/dL — ABNORMAL LOW (ref 13.0–17.0)
Immature Granulocytes: 1 %
Lymphocytes Relative: 13 %
Lymphs Abs: 1.3 10*3/uL (ref 0.7–4.0)
MCH: 32.8 pg (ref 26.0–34.0)
MCHC: 32.3 g/dL (ref 30.0–36.0)
MCV: 101.6 fL — ABNORMAL HIGH (ref 80.0–100.0)
Monocytes Absolute: 1.1 10*3/uL — ABNORMAL HIGH (ref 0.1–1.0)
Monocytes Relative: 11 %
Neutro Abs: 7.2 10*3/uL (ref 1.7–7.7)
Neutrophils Relative %: 70 %
Platelets: 236 10*3/uL (ref 150–400)
RBC: 3.17 MIL/uL — ABNORMAL LOW (ref 4.22–5.81)
RDW: 15 % (ref 11.5–15.5)
WBC: 10 10*3/uL (ref 4.0–10.5)
nRBC: 0 % (ref 0.0–0.2)

## 2020-10-21 LAB — RESP PANEL BY RT-PCR (RSV, FLU A&B, COVID)  RVPGX2
Influenza A by PCR: NEGATIVE
Influenza B by PCR: NEGATIVE
Resp Syncytial Virus by PCR: NEGATIVE
SARS Coronavirus 2 by RT PCR: NEGATIVE

## 2020-10-21 LAB — COMPREHENSIVE METABOLIC PANEL
ALT: 13 U/L (ref 0–44)
AST: 32 U/L (ref 15–41)
Albumin: 2.2 g/dL — ABNORMAL LOW (ref 3.5–5.0)
Alkaline Phosphatase: 87 U/L (ref 38–126)
Anion gap: 7 (ref 5–15)
BUN: 19 mg/dL (ref 8–23)
CO2: 24 mmol/L (ref 22–32)
Calcium: 10.2 mg/dL (ref 8.9–10.3)
Chloride: 114 mmol/L — ABNORMAL HIGH (ref 98–111)
Creatinine, Ser: 0.8 mg/dL (ref 0.61–1.24)
GFR, Estimated: >60 mL/min (ref >60)
Glucose, Bld: 74 mg/dL (ref 70–99)
Potassium: 3.3 mmol/L — ABNORMAL LOW (ref 3.5–5.1)
Sodium: 145 mmol/L (ref 135–145)
Total Bilirubin: 1 mg/dL (ref 0.3–1.2)
Total Protein: 5.1 g/dL — ABNORMAL LOW (ref 6.5–8.1)

## 2020-10-21 LAB — CULTURE, BLOOD (ROUTINE X 2)
Culture: NO GROWTH
Culture: NO GROWTH
Special Requests: ADEQUATE

## 2020-10-21 MED ORDER — LORAZEPAM 2 MG/ML PO CONC
1.0000 mg | Freq: Three times a day (TID) | ORAL | 0 refills | Status: AC | PRN
Start: 2020-10-21 — End: ?

## 2020-10-21 NOTE — Progress Notes (Signed)
Daily Progress Note   Patient Name: Ian [name]       Date: 10/21/2020 DOB: [birth year]  Age: 87 y.o. MRN#: [id number] Attending Physician: Nita Sells, MD Primary Care Physician: Bernerd Limbo, MD Admit Date: 10/15/2020  Reason for Consultation/Follow-up: Establishing goals of care  Subjective: I met this morning with patient's wife, Ian [name].  She reports that he is a little bit more awake and interactive today.  We discussed that he was evaluated and determined not to be eligible for [facility] at this time.  She reports that she is okay with plan to pursue skilled facility placement.  We talked about the fact he has been transitioned over to oral antibiotics and plan is to work to discharge from the hospital and see how he continues to progress.  She understands that he is going to continue to aspirate and there is a high chance that he is going to decompensate quickly.  Overall goal is to make sure he has as much comfort and quality of life as possible moving forward.  Length of Stay: 6  Current Medications: Scheduled Meds:  . cephALEXin  500 mg Oral Q8H  . sodium chloride flush  3 mL Intravenous Q12H    Continuous Infusions: . sodium chloride 250 mL (10/18/20 2235)    PRN Meds: sodium chloride, acetaminophen **OR** acetaminophen, ipratropium-albuterol, LORazepam, [DISCONTINUED] ondansetron **OR** ondansetron (ZOFRAN) IV, polyvinyl alcohol, Resource ThickenUp Clear  Physical Exam      General: Alert, awake, in no acute distress. Limited engagement. HEENT: C collar in place Heart: Regular rate and rhythm. No murmur appreciated. Lungs: Fair air movement Abdomen: Soft, nontender, nondistended, positive bowel sounds.  Ext: No significant edema Skin: Warm and dry       Vital Signs: BP 140/87 (BP Location: Right Arm)   Pulse 81   Temp 98.3 F (36.8 C)   Resp 16   Ht _0  (1.803 m)   Wt 80.8 kg   SpO2 94%   BMI 24.84 kg/m  SpO2: SpO2: 94 % O2 Device: O2 Device: Nasal Cannula O2 Flow Rate: O2 Flow Rate (L/min): 2 L/min  Intake/output summary:   Intake/Output Summary (Last 24 hours) at 10/21/2020 1734 Last data filed at 10/21/2020 1300 Gross per 24 hour  Intake 200 ml  Output --  Net 200 ml   LBM:  Last BM Date: 10/15/20 Baseline Weight: Weight: 81.6 kg Most recent weight: Weight: 80.8 kg       Palliative Assessment/Data:      Patient Active Problem List   Diagnosis Date Noted  . Fracture of anterior arch of C1 ([place]) 10/15/2020  . Severe sepsis ([place]) 10/15/2020  . Cellulitis of left lower extremity 10/15/2020  . Acute metabolic encephalopathy 74/16/3845  . Anemia 02/12/2018  . Duodenal ulcer 02/12/2018  . History of stroke 02/12/2018  . ETOH abuse 02/12/2018  . Right foot drop 02/12/2018  . Dyslipidemia 11/16/2017  . GI bleed due to NSAIDs 11/09/2017  . Melena 11/09/2017  . Acute blood loss anemia 11/09/2017  . GI bleed 11/09/2017  . Peptic ulcer disease   . Dysphagia, post-stroke   . Essential hypertension   . Embolic cerebral infarction ([place]) 10/29/2017  . Dementia without behavioral disturbance ([place])   . Benign essential HTN   . Diastolic dysfunction   . Hypothyroidism   . Hypokalemia   . Altered mental status   . Pressure injury of skin 10/26/2017  . Loss of weight 10/26/2017  . Elevated troponin I level 10/26/2017  . Leukocytosis 10/26/2017  . Multiple leg contusions, unspecified laterality, sequela 10/26/2017  . Cognitive impairment 10/26/2017  . Scalp hematoma, subsequent encounter 10/26/2017  . Head injury 10/26/2017  . Fall at home, sequela 10/26/2017  . Right BBB/left ant fasc block 10/26/2017  . Acute confusional state 10/26/2017  . Dizziness and giddiness 10/26/2017  . Cerebral infarction [facility]),  right occipital lobe 10/26/2017  . Elevated AST (SGOT) 10/26/2017  . Shoulder injury, right, subsequent encounter 10/26/2017  . Paranasal sinus disease 10/26/2017  . Maxillary sinusitis 10/26/2017  . Ethmoid sinusitis 10/26/2017  . Sphenoid sinusitis 10/26/2017  . Rhabdomyolysis 10/25/2017  . Neuropathy 03/31/2013    Palliative Care Assessment & Plan   Patient Profile: 87 y.o. male with multiple medical problems including multiple CVAs (2019), ETOH abuse, foot drop, hypertension, aortic aneurysm, and questionable dementia. Patient presented from home with concerns of altered mental status and found by wife on floor s/p unwitnessed fall. Work-up identified severe sepesis, and fracture through the anterior arch of C1.  Palliative consulted for [place].  Recommendations/Plan: Discussed with patient's wife at the bedside today.  We discussed his chronic comorbid conditions and the fact that he is at high risk for continued decompensation due to possibility of microaspiration/aspiration regardless of other interventions moving forward. She is in agreement with plan to continue current interventions with overall focus on maintaining as much comfort and quality of life as possible moving forward while working to get out of the hospital. Appreciate transition of care help with disposition options.  Code Status:    Code Status Orders  (From admission, onward)         Start     Ordered   10/17/20 1100  Do not attempt resuscitation (DNR)  Continuous       Question Answer Comment  In the event of cardiac or respiratory ARREST Do not call a "code blue"   In the event of cardiac or respiratory ARREST Do not perform Intubation, CPR, defibrillation or ACLS   In the event of cardiac or respiratory ARREST Use medication by any route, position, wound care, and other measures to relive pain and suffering. May use oxygen, suction and manual treatment of airway obstruction as needed for comfort.       10/17/20 1059        Code Status History  Date Active Date Inactive Code Status Order ID Comments User Context   10/15/2020 2039 10/17/2020 1059 Full Code [id number]  Lenore Cordia, MD ED   11/09/2017 0113 11/11/2017 2101 Full Code [id number]  Toy Baker, MD Inpatient   10/29/2017 1737 11/09/2017 0054 Full Code [id number]  Cathlyn Parsons, PA-C Inpatient   10/29/2017 1737 10/29/2017 1737 Full Code [id number]  Cathlyn Parsons, PA-C Inpatient   10/25/2017 1908 10/29/2017 1724 Full Code [id number]  Everrett Coombe, MD ED   Advance Care Planning Activity      Prognosis:  Guarded/poor  Discharge Planning: To Be Determined-likely skilled facility with palliative following  Care plan was discussed with IDT  Thank you for allowing the Palliative Medicine Team to assist in the care of this patient.   Time In: 1050 Time Out: 1120 Total Time 30 Prolonged Time Billed No      Greater than 50%  of this time was spent counseling and coordinating care related to the above assessment and plan.  Micheline Rough, MD  Please contact Palliative Medicine Team phone at [phone] for questions and concerns.

## 2020-10-21 NOTE — TOC Progression Note (Signed)
Transition of Care [facility]) - Progression Note    Patient Details  Name: Ian [name] MRN: [id number] Date of Birth: 1926-02-19  Transition of Care [facility]) CM/SW [place], [place] Phone Number: 10/21/2020, 2:39 PM  Clinical Narrative:   Patient is ineligible for [facility].  Plan has changed to short term rehab with outpatient palliative follow up by [facility]. Wife's first choice is [place] due to proximity to home.  Spoke with [facility] who can accept patient tomorrow with insuranc eauthorization. [employer] reference # [id number]. TOC will continue to follow during the course of hospitalization.      Expected Discharge Plan: Skilled Nursing Facility Barriers to Discharge: Other (comment) (SNF pending insuranc auth)  Expected Discharge Plan and Services Expected Discharge Plan: [place]   Discharge Planning Services: CM Consult   Living arrangements for the past 2 months: Single Family Home                                       Social Determinants of Health (SDOH) Interventions    Readmission Risk Interventions No flowsheet data found.

## 2020-10-21 NOTE — Progress Notes (Signed)
Long discussion with wife at the bedside and reiterated discussions with palliative care Wife is ready for him to transition to comfort care He has a bed at [facility] for tomorrow once authorization is obtained as per Education officer, museum Ian [name]  Covid/flu test ordered portable DNR and scripts in chart will be signed He will discharge probably tomorrow morning   No charge  Verneita Griffes, MD [facility] 3:26 PM

## 2020-10-21 NOTE — Discharge Summary (Addendum)
Physician Discharge Summary  Ian [name] ZWC:[id number] DOB: 11-Jun-1926 DOA: 10/15/2020  PCP: Bernerd Limbo, MD  Admit date: 10/15/2020 Discharge date: 10/24/2020   Recommendations for Outpatient Follow-up:  1. Will be discharged to [facility] with palliative care following  Discharge Diagnoses:  MAIN problem for hospitalization   Severe sepsis secondary to lower extremities Prior to admission Type II dens fracture C1 arch --highly unstable--very poor surgical candidate Severe dysphagia  Please see below for itemized issues addressed in HOpsital- refer to other progress notes for clarity if needed  Discharge Condition: Poor long-term prognosis  Diet recommendation: Dysphagia diet 1 if able to eat  Filed Weights   10/19/20 0652 10/20/20 0500 10/21/20 0443  Weight: 83.8 kg 84.7 kg 80.8 kg    History of present illness:  87 year old home dwelling male History recurrent CVAs 10/25/2017 + prior bifrontal with chronic foot drop since that time --subarachnoid's/subdurals and EtOH abuse-probable dementia from both HTN Hypothyroid with complex thyroid nodule in the past Ascending aortic aneurysm in the past Prior upper GI bleed 11/09/2017 from duodenal ulcers  Admitted from home 10/15/2020 with significant confusion-does not drink heavily any further Found to have several falls recently, wife found him on the ground unkempt with feces and body fluids about On work-up found to have severe sepsis secondary to lower extremity cellulitis superimposed on significant lower extremity edema, C1-C2 instability with new type II dens fracture at C1 arch from prior to admission  Palliative care saw the patient in consult-family elected to pursue comfort trajectory after discussion  Hospital Course:  Severe sepsis lower extremity cellulitis prior to admission superimposed on anasarca Transition Ancef 1 g every 8-->cefepime, antibiotics discontinued on discharge as hospice philosophy Type II  dens fracture C1 arch Needs rigid cervical collar at all times for 6 weeks-poor surgical candidate per Dr. Venetia Constable neurosurgery who saw patient 2/20 Dysphagia, deemed unsafe to eat by SLP Severe aspiration risk based on evaluation 2/21, on 2/24 May have dysphagia 1 diet, sit up 90 degrees 30 minutes after monitor c for aspiration Goals are now comfort related Recurrent CVAs last 1 10/25/2017 Not on prior to admission aspirin Holding at this time atorvastatin and Plavix in addition Prior upper GI bleed 10/2017 No anticoagulation given risk of bleeding Complex thyroid mass Stop Synthroid Poor candidate for work-up of mass in current state Ascending aortic aneurysm previously Poor candidate for work-up  Consultations:  Palliative care  Discharge Exam: Vitals:   10/22/20 1328 10/23/20 1430  BP: 134/86 137/89  Pulse: 86 81  Resp: 15 18  Temp: (!) 97.5 F (36.4 C) 98 F (36.7 C)  SpO2: 93% 95%    Discharge Instructions   Discharge Instructions    Diet - low sodium heart healthy   Complete by: As directed    Discharge wound care:   Complete by: As directed    Wound care  Every shift      Comments: Wound care to left LE full thickness traumatic wound:  Cleanse with soap and water, rinse and pat gently dry. Cover with folded piece of xeroform gauze Kellie Simmering # 294). Top with dry gauze and secure with a few turns of Kertlix roll gauze/paper tape. Place foot/feet into Prevalon boots.  02/   Increase activity slowly   Complete by: As directed      Allergies as of 10/21/2020   No Known Allergies     Medication List    STOP taking these medications   atorvastatin 40 MG tablet Commonly known as:  LIPITOR   clopidogrel 75 MG tablet Commonly known as: PLAVIX   furosemide 20 MG tablet Commonly known as: LASIX   nitroGLYCERIN 0.4 MG SL tablet Commonly known as: NITROSTAT   NUTRITIONAL SUPPLEMENT PO   pantoprazole 40 MG tablet Commonly known as: PROTONIX   potassium  chloride SA 20 MEQ tablet Commonly known as: KLOR-CON   Stress B-Complex/Vit C/Zinc Tabs   vitamin B-12 1000 MCG tablet Commonly known as: CYANOCOBALAMIN     TAKE these medications   levothyroxine 50 MCG tablet Commonly known as: SYNTHROID Take 1 tablet (50 mcg total) by mouth daily before breakfast. What changed: when to take this   LORazepam 2 MG/ML concentrated solution Commonly known as: ATIVAN Take 0.5 mLs (1 mg total) by mouth every 8 (eight) hours as needed for anxiety.            Discharge Care Instructions  (From admission, onward)         Start     Ordered   10/21/20 0000  Discharge wound care:       Comments: Wound care  Every shift      Comments: Wound care to left LE full thickness traumatic wound:  Cleanse with soap and water, rinse and pat gently dry. Cover with folded piece of xeroform gauze Kellie Simmering # 294). Top with dry gauze and secure with a few turns of Kertlix roll gauze/paper tape. Place foot/feet into Prevalon boots.  02/   10/21/20 1453         No Known Allergies    The results of significant diagnostics from this hospitalization (including imaging, microbiology, ancillary and laboratory) are listed below for reference.    Significant Diagnostic Studies: DG Chest 1 View  Result Date: 10/15/2020 CLINICAL DATA:  Pain status post fall EXAM: CHEST  1 VIEW COMPARISON:  October 25, 2017 FINDINGS: The heart size is relatively stable. The thoracic aorta appears to be dilated. There are chronic airspace opacities at the lung bases. These are favored to represent atelectasis or scarring. There is no pneumothorax. No definite acute osseous abnormality. IMPRESSION: 1. No definite acute osseous abnormality. 2. Bibasilar atelectasis or scarring. 3. Dilated thoracic aorta. Electronically Signed   By: Constance Holster M.D.   On: 10/15/2020 15:06   DG Pelvis 1-2 Views  Result Date: 10/15/2020 CLINICAL DATA:  Acute pain due to fall. EXAM: PELVIS - 1-2 VIEW  COMPARISON:  None. FINDINGS: There is diffuse osteopenia which limits detection of nondisplaced fractures. There is no acute displaced fracture or dislocation. There are moderate degenerative changes of both hips. There are degenerative changes of the lower lumbar spine. IMPRESSION: 1. No acute displaced fracture or dislocation. Diffuse osteopenia limits detection of nondisplaced fractures. 2. Moderate degenerative changes of both hips. Electronically Signed   By: Constance Holster M.D.   On: 10/15/2020 15:07   CT Head Wo Contrast  Result Date: 10/15/2020 CLINICAL DATA:  Pain after trauma EXAM: CT HEAD WITHOUT CONTRAST CT CERVICAL SPINE WITHOUT CONTRAST TECHNIQUE: Multidetector CT imaging of the head and cervical spine was performed following the standard protocol without intravenous contrast. Multiplanar CT image reconstructions of the cervical spine were also generated. COMPARISON:  CT scan of the head October 29, 2017. CT angio neck October 27, 2017. FINDINGS: CT HEAD FINDINGS Brain: No subdural, epidural, or subarachnoid hemorrhage. No mass effect or midline shift. Ventricles and sulci are stable. Cerebellum, brainstem, and basal cisterns are normal. White matter changes are identified. No acute cortical ischemia or infarct identified. Vascular: Calcified  atherosclerosis in the intracranial carotids. Skull: Normal. Negative for fracture or focal lesion. Sinuses/Orbits: The left maxillary and sphenoid sinuses are completely opacified, unchanged since March of 2019. Opacification of scattered left mastoid air cells is identified without associated fracture or rosea in. No other abnormalities. Other: None. CT CERVICAL SPINE FINDINGS Alignment: C1 articulates with the occipital condyles. C1 is somewhat displaced posteriorly relative to the remainder of the cervical spine. This is more pronounced compared to the 2017-11-14 study. Anterolisthesis of C3 versus C4 is stable since 2019. Several other mild listheses are  identified, also likely degenerative in nature. Anterolisthesis of C7 versus T1 is stable. Skull base and vertebrae: There are fractures through the right and left aspects of the C1 anterior arch. There is a fracture through the odontoid process best seen on sagittal image 25. No other fractures are identified. Soft tissues and spinal canal: Calcified pannus associated with the C1 level. This was also present in March of 2019. No other soft tissue abnormalities are identified. Disc levels: Multilevel degenerative changes. Facet degenerative changes. Upper chest: Negative. Other: No other abnormalities. IMPRESSION: 1. There is a fracture through the odontoid process as well as fracture through the anterior arch of C1. C1 is somewhat posteriorly displaced relative to the remainder of the cervical spine. This was at least partially present in March of 2019 but does appear somewhat increased. The canal remains patent. 2. Degenerative changes in the cervical spine. 3. No acute intracranial abnormalities. 4. Sinus disease as above. Findings called to Dr. Beecher Mcardle. Electronically Signed   By: Dorise Bullion III M.D   On: 10/15/2020 16:41   CT Cervical Spine Wo Contrast  Result Date: 10/15/2020 CLINICAL DATA:  Pain after trauma EXAM: CT HEAD WITHOUT CONTRAST CT CERVICAL SPINE WITHOUT CONTRAST TECHNIQUE: Multidetector CT imaging of the head and cervical spine was performed following the standard protocol without intravenous contrast. Multiplanar CT image reconstructions of the cervical spine were also generated. COMPARISON:  CT scan of the head October 29, 2017. CT angio neck 2017-11-14. FINDINGS: CT HEAD FINDINGS Brain: No subdural, epidural, or subarachnoid hemorrhage. No mass effect or midline shift. Ventricles and sulci are stable. Cerebellum, brainstem, and basal cisterns are normal. White matter changes are identified. No acute cortical ischemia or infarct identified. Vascular: Calcified atherosclerosis in the  intracranial carotids. Skull: Normal. Negative for fracture or focal lesion. Sinuses/Orbits: The left maxillary and sphenoid sinuses are completely opacified, unchanged since March of 2019. Opacification of scattered left mastoid air cells is identified without associated fracture or rosea in. No other abnormalities. Other: None. CT CERVICAL SPINE FINDINGS Alignment: C1 articulates with the occipital condyles. C1 is somewhat displaced posteriorly relative to the remainder of the cervical spine. This is more pronounced compared to the 2017/11/14 study. Anterolisthesis of C3 versus C4 is stable since 2019. Several other mild listheses are identified, also likely degenerative in nature. Anterolisthesis of C7 versus T1 is stable. Skull base and vertebrae: There are fractures through the right and left aspects of the C1 anterior arch. There is a fracture through the odontoid process best seen on sagittal image 25. No other fractures are identified. Soft tissues and spinal canal: Calcified pannus associated with the C1 level. This was also present in March of 2019. No other soft tissue abnormalities are identified. Disc levels: Multilevel degenerative changes. Facet degenerative changes. Upper chest: Negative. Other: No other abnormalities. IMPRESSION: 1. There is a fracture through the odontoid process as well as fracture through  the anterior arch of C1. C1 is somewhat posteriorly displaced relative to the remainder of the cervical spine. This was at least partially present in March of 2019 but does appear somewhat increased. The canal remains patent. 2. Degenerative changes in the cervical spine. 3. No acute intracranial abnormalities. 4. Sinus disease as above. Findings called to Dr. Beecher Mcardle. Electronically Signed   By: Dorise Bullion III M.D   On: 10/15/2020 16:41   DG CHEST PORT 1 VIEW  Result Date: 10/19/2020 CLINICAL DATA:  Pneumonia. EXAM: PORTABLE CHEST 1 VIEW COMPARISON:  Chest x-ray 10/16/2020,  10/15/2020. FINDINGS: Patient rotated to the right. Surgical clips and sutures right upper chest again noted. Aorta is again noted be tortuous. Heart size stable. Low lung volumes. Persistent bibasilar atelectasis and infiltrates with slight progression from prior exam. Small bilateral pleural effusions. Prominent skin fold on the left. No pneumothorax. IMPRESSION: Low lung volumes with persistent bibasilar atelectasis and infiltrates with slight progression from prior exam. Small bilateral pleural effusions. Electronically Signed   By: Marcello Moores  Register   On: 10/19/2020 05:46   DG CHEST PORT 1 VIEW  Result Date: 10/16/2020 CLINICAL DATA:  87 year old male with hypoxia. EXAM: PORTABLE CHEST 1 VIEW COMPARISON:  Chest radiograph dated 10/15/2020. FINDINGS: Mild cardiomegaly with mild vascular congestion. Bibasilar streaky densities may represent atelectasis or infiltrate. There is mild eventration of the right hemidiaphragm. No large pleural effusion. No pneumothorax. Atherosclerotic calcification of the aorta. No acute osseous pathology. Surgical clips over the upper mediastinum as before. IMPRESSION: 1. Mild cardiomegaly with mild central vascular congestion. 2. Bibasilar atelectasis/scarring versus developing infiltrate. Electronically Signed   By: Anner Crete M.D.   On: 10/16/2020 17:08   DG Swallowing Func-Speech Pathology  Result Date: 10/17/2020 Objective Swallowing Evaluation: Type of Study: MBS-Modified Barium Swallow Study  Patient Details Name: Ian [name] MRN: [id number] Date of Birth: 1925-11-16 Today's Date: 10/17/2020 Time: SLP Start Time (ACUTE ONLY): 1551 -SLP Stop Time (ACUTE ONLY): 1630 SLP Time Calculation (min) (ACUTE ONLY): 39 min Past Medical History: Past Medical History: Diagnosis Date . Benign neoplasm of parathyroid gland 03/31/2013 . Calculus of kidney 03/31/2013  Overview:    see ER note  . Hypertension  . Leukocytosis 10/26/2017 . Loss of weight 10/26/2017 . Multiple leg contusions,  unspecified laterality, sequela 10/26/2017 . Scalp hematoma, subsequent encounter 10/26/2017 . Stroke [facility])  Past Surgical History: Past Surgical History: Procedure Laterality Date . ESOPHAGOGASTRODUODENOSCOPY (EGD) WITH PROPOFOL N/A 11/09/2017  Procedure: ESOPHAGOGASTRODUODENOSCOPY (EGD) WITH PROPOFOL;  Surgeon: Laurence Spates, MD;  Location: [facility];  Service: Endoscopy;  Laterality: N/A; HPI: Patient is a 87 y.o. male with PMH: CVA (2019), HTN, HLD, hypothyroidism, alcohol use disorder, dementia, who presented to ED via EMS for evaluation of confusion. Per wife's report at time of admission, patient had fall 2-3 days prior resulting in right shin injury. She found patient down on floor presumed to be from unwitnessed fall. Head CT did not reveal any acute intracranial abnormalities. CXR revealed chronic airspace opacities at lung bases favoring atelectasis or scarring.  Subjective: awake in chair in flouro Assessment / Plan / Recommendation CHL IP CLINICAL IMPRESSIONS 10/17/2020 Clinical Impression Mr Deloria presents with moderately severe oropharyngeal dysphagia with sensorimotor deficits.  He was tested with hard cervical collar in place per neurosurgery due to his new fracture to anterior arch of C1.  Oropharyngeal musculature weakness results in impaired oral control with delayed oral transiting due to lingual pumping A-P, decreased propulsion.  Delay was most notable with solids/puree - which  pt required 12 seconds with excessive lingual pumping to transit.  Pharyngeal swallow was weak impairing epiglottic deflection, laryngeal elevation, pharyngeal contraction resulting in retention *at vallecular region more than pyriform sinus.  Cues for "strong swallow" clinically helpful to improve muscle recruitment however doubtful this will be replicated consistently during meals and suspect pt will fatigue.  Cues to "[facility]" were effective to clear large amount of vallecular and tongue base retention that pt does not  sense. Pt actually stated "I hock all of the time" to this SLP - suspected some component of chronic dysphagia.  Fortunately, he can clear up to 80% of vallecular retention with great effort - and suspect this has been vital in protecting his airway.   Fatigue factor noted as when testing concluded pt continued with barium retention despite efforts to clear.  His pharyngeal swallow is also delayed with puree/cracker mixture barium aggregating in vallecular space prior to swallow trigger. Once during testing,  pt stated "OK girls" - indicating he had swallowed when pudding was retained at tongue base and vallecular space without pt swallowing.   SLP provided pt with necter liquid to facilitate swallow.  Given pt's decreased oral control and overt aspiration observed with thin water at bedside, recommend nectar liquids -full liquids/nectar diet initially with strategies to mitigate his risk. Advise he be allowed ice chips and tsp amounts of thin water between meals after oral care.  Highly advise consider a palliative consult for this patient given his deconditioning, current illness, level of dysphagia with concern for meeting nutritional needs and aspiration risk.  Pt admits his swallowing ability is more impaired that baseline.   SLP had explained to [name], pt's wife, that goal of testing is to determine if any consistency and strategy may mitigate aspiration but not prevent it.  Wife reports not desire for feeding tube.  Will follow for treatment, family education and readiness for dietary advancement.  Pt's wife was not present during evaluation but was invited to attend.   SLP Visit Diagnosis Dysphagia, oropharyngeal phase (R13.12) Attention and concentration deficit following -- Frontal lobe and executive function deficit following -- Impact on safety and function Severe aspiration risk;Risk for inadequate nutrition/hydration   CHL IP TREATMENT RECOMMENDATION 10/17/2020 Treatment Recommendations Therapy as  outlined in treatment plan below   Prognosis 10/17/2020 Prognosis for Safe Diet Advancement Fair Barriers to Reach Goals Cognitive deficits;Time post onset Barriers/Prognosis Comment -- CHL IP DIET RECOMMENDATION 10/17/2020 SLP Diet Recommendations Nectar thick liquid;Free water protocol after oral care Liquid Administration via Cup Medication Administration Crushed with puree Compensations Slow rate;Small sips/bites;Multiple dry swallows after each bite/sip;Other (Comment) Postural Changes Seated upright at 90 degrees;Remain semi-upright after after feeds/meals (Comment)   CHL IP OTHER RECOMMENDATIONS 10/17/2020 Recommended Consults -- Oral Care Recommendations Oral care BID Other Recommendations --   CHL IP FOLLOW UP RECOMMENDATIONS 10/17/2020 Follow up Recommendations Skilled Nursing facility;24 hour supervision/assistance   CHL IP FREQUENCY AND DURATION 10/17/2020 Speech Therapy Frequency (ACUTE ONLY) min 2x/week Treatment Duration 1 week      CHL IP ORAL PHASE 10/17/2020 Oral Phase Impaired Oral - Pudding Teaspoon -- Oral - Pudding Cup -- Oral - Honey Teaspoon -- Oral - Honey Cup -- Oral - Nectar Teaspoon Decreased bolus cohesion;Premature spillage;Weak lingual manipulation;Reduced posterior propulsion Oral - Nectar Cup Decreased bolus cohesion;Reduced posterior propulsion;Premature spillage;Weak lingual manipulation Oral - Nectar Straw Decreased bolus cohesion;Reduced posterior propulsion;Premature spillage;Weak lingual manipulation Oral - Thin Teaspoon Decreased bolus cohesion;Reduced posterior propulsion;Premature spillage;Weak lingual manipulation Oral - Thin Cup Decreased  bolus cohesion;Reduced posterior propulsion;Weak lingual manipulation;Right anterior bolus loss Oral - Thin Straw Reduced posterior propulsion;Weak lingual manipulation;Lingual pumping Oral - Puree Decreased bolus cohesion;Reduced posterior propulsion;Weak lingual manipulation;Lingual pumping Oral - Mech Soft Decreased bolus cohesion;Reduced  posterior propulsion;Weak lingual manipulation;Impaired mastication;Lingual pumping Oral - Regular -- Oral - Multi-Consistency -- Oral - Pill -- Oral Phase - Comment --  CHL IP PHARYNGEAL PHASE 10/17/2020 Pharyngeal Phase Impaired Pharyngeal- Pudding Teaspoon -- Pharyngeal -- Pharyngeal- Pudding Cup -- Pharyngeal -- Pharyngeal- Honey Teaspoon -- Pharyngeal -- Pharyngeal- Honey Cup -- Pharyngeal -- Pharyngeal- Nectar Teaspoon Delayed swallow initiation-vallecula;Reduced epiglottic inversion;Reduced laryngeal elevation;Reduced airway/laryngeal closure;Pharyngeal residue - valleculae;Pharyngeal residue - pyriform;Reduced tongue base retraction Pharyngeal -- Pharyngeal- Nectar Cup Pharyngeal residue - valleculae;Pharyngeal residue - pyriform;Delayed swallow initiation-vallecula;Reduced epiglottic inversion;Reduced anterior laryngeal mobility;Reduced pharyngeal peristalsis;Reduced laryngeal elevation;Reduced tongue base retraction;Penetration/Aspiration during swallow;Penetration/Apiration after swallow Pharyngeal Material enters airway, remains ABOVE vocal cords and not ejected out Pharyngeal- Nectar Straw Delayed swallow initiation-vallecula;Reduced epiglottic inversion;Reduced anterior laryngeal mobility;Reduced laryngeal elevation;Reduced airway/laryngeal closure;Reduced tongue base retraction;Penetration/Aspiration during swallow;Penetration/Apiration after swallow;Trace aspiration;Reduced pharyngeal peristalsis Pharyngeal Material enters airway, passes BELOW cords and not ejected out despite cough attempt by patient Pharyngeal- Thin Teaspoon Delayed swallow initiation-vallecula;Reduced laryngeal elevation;Reduced airway/laryngeal closure;Reduced tongue base retraction;Reduced epiglottic inversion;Reduced pharyngeal peristalsis;Penetration/Aspiration during swallow;Penetration/Apiration after swallow;Trace aspiration Pharyngeal Material enters airway, passes BELOW cords without attempt by patient to eject out (silent  aspiration) Pharyngeal- Thin Cup Delayed swallow initiation-vallecula;Reduced epiglottic inversion;Reduced pharyngeal peristalsis;Reduced anterior laryngeal mobility;Reduced laryngeal elevation;Reduced tongue base retraction;Penetration/Aspiration before swallow;Penetration/Aspiration during swallow;Moderate aspiration;Pharyngeal residue - valleculae;Pharyngeal residue - pyriform Pharyngeal Material enters airway, passes BELOW cords without attempt by patient to eject out (silent aspiration) Pharyngeal- Thin Straw Delayed swallow initiation-vallecula;Reduced pharyngeal peristalsis;Reduced epiglottic inversion;Reduced anterior laryngeal mobility;Reduced laryngeal elevation;Reduced airway/laryngeal closure;Reduced tongue base retraction;Pharyngeal residue - valleculae;Pharyngeal residue - pyriform Pharyngeal Material enters airway, passes BELOW cords and not ejected out despite cough attempt by patient Pharyngeal- Puree Delayed swallow initiation-vallecula;Reduced epiglottic inversion;Reduced pharyngeal peristalsis;Reduced laryngeal elevation;Reduced airway/laryngeal closure;Reduced tongue base retraction;Pharyngeal residue - valleculae;Pharyngeal residue - pyriform Pharyngeal Material does not enter airway Pharyngeal- Mechanical Soft Delayed swallow initiation-vallecula;Reduced pharyngeal peristalsis;Reduced epiglottic inversion;Reduced anterior laryngeal mobility;Reduced laryngeal elevation;Reduced airway/laryngeal closure;Reduced tongue base retraction;Pharyngeal residue - valleculae;Pharyngeal residue - pyriform Pharyngeal Material does not enter airway Pharyngeal- Regular -- Pharyngeal -- Pharyngeal- Multi-consistency -- Pharyngeal -- Pharyngeal- Pill -- Pharyngeal -- Pharyngeal Comment --  CHL IP CERVICAL ESOPHAGEAL PHASE 10/17/2020 Cervical Esophageal Phase Impaired Pudding Teaspoon -- Pudding Cup -- Honey Teaspoon -- Honey Cup -- Nectar Teaspoon -- Nectar Cup -- Nectar Straw -- Thin Teaspoon -- Thin Cup -- Thin  Straw -- Puree -- Mechanical Soft -- Regular -- Multi-consistency -- Pill -- Cervical Esophageal Comment Prominent Cricopharyngeus present - observed with nectar thick liquids.   Secretions and barium retained at pyriform sinus region.  Pt observed to frequently belch during testing without observation of backflowed barium into pharynx. Ian [name] 10/17/2020, 5:35 PM              ECHOCARDIOGRAM COMPLETE  Result Date: 10/16/2020    ECHOCARDIOGRAM REPORT   Patient Name:   Ian [name] Date of Exam: 10/16/2020 Medical Rec #:  [id number]    Height:       71.0 in Accession #:    [id number]   Weight:       180.0 lb Date of Birth:  [birth year]     BSA:          2.016 m Patient Age:    51 years  BP:           108/81 mmHg Patient Gender: M            HR:           83 bpm. Exam Location:  Inpatient Procedure: Limited Echo, Cardiac Doppler and Color Doppler Indications:    Atrial fibrillation  History:        Patient has prior history of Echocardiogram examinations, most                 recent 10/26/2017. Stroke, Signs/Symptoms:Dementia; Risk                 Factors:Hypertension and Dyslipidemia. Confusion, Alcohol abuse.  Sonographer:    Dustin Flock Referring Phys: [id number] VISHAL R PATEL  Sonographer Comments: Image acquisition challenging due to uncooperative patient. IMPRESSIONS  1. Study ended abruptly Pt not cooperative. Reorder when patient calmer.  2. There is mild left ventricular hypertrophy.  3. The aortic valve is abnormal. FINDINGS  Left Ventricle: The left ventricular internal cavity size was normal in size. There is mild left ventricular hypertrophy. Aortic Valve: The aortic valve is abnormal. Pulmonic Valve: The pulmonic valve was not well visualized. Pulmonic valve regurgitation is not visualized. Aorta: The aortic root is normal in size and structure.  LEFT VENTRICLE PLAX 2D LVIDd:         4.16 cm LVIDs:         1.98 cm LV PW:         1.35 cm LV IVS:        1.23 cm LVOT diam:     2.20 cm LVOT  Area:     3.80 cm  LEFT ATRIUM         Index LA diam:    2.90 cm 1.44 cm/m   AORTA Ao Root diam: 3.30 cm  SHUNTS Systemic Diam: 2.20 cm Dorris Carnes MD Electronically signed by Dorris Carnes MD Signature Date/Time: 10/16/2020/5:21:00 PM    Final    VAS [place] LOWER EXTREMITY VENOUS (DVT)  Result Date: 10/16/2020  Lower Venous DVT Study Indications: BLE edema.  Limitations: Patient AMS with continous movement & wound to LLE calf. Comparison Study: Previous exam 01/20/18 - negative Performing Technologist: Rogelia Rohrer  Examination Guidelines: A complete evaluation includes B-mode imaging, spectral Doppler, color Doppler, and power Doppler as needed of all accessible portions of each vessel. Bilateral testing is considered an integral part of a complete examination. Limited examinations for reoccurring indications may be performed as noted. The reflux portion of the exam is performed with the patient in reverse Trendelenburg.  +---------+---------------+---------+-----------+----------+--------------+ RIGHT    CompressibilityPhasicitySpontaneityPropertiesThrombus Aging +---------+---------------+---------+-----------+----------+--------------+ CFV      Full           Yes      Yes                                 +---------+---------------+---------+-----------+----------+--------------+ SFJ      Full                                                        +---------+---------------+---------+-----------+----------+--------------+ FV Prox  Full           Yes      Yes                                 +---------+---------------+---------+-----------+----------+--------------+  FV Mid   Full           Yes      Yes                                 +---------+---------------+---------+-----------+----------+--------------+ FV DistalFull           Yes      Yes                                 +---------+---------------+---------+-----------+----------+--------------+ PFV      Full                                                         +---------+---------------+---------+-----------+----------+--------------+ POP      Full           Yes      Yes                                 +---------+---------------+---------+-----------+----------+--------------+ PTV      Full                                                        +---------+---------------+---------+-----------+----------+--------------+ PERO     Full                                                        +---------+---------------+---------+-----------+----------+--------------+   +---------+---------------+---------+-----------+----------+------------------+ LEFT     CompressibilityPhasicitySpontaneityPropertiesThrombus Aging     +---------+---------------+---------+-----------+----------+------------------+ CFV      Full           Yes      Yes                                     +---------+---------------+---------+-----------+----------+------------------+ SFJ      Full                                                            +---------+---------------+---------+-----------+----------+------------------+ FV Prox  Full           Yes      Yes                                     +---------+---------------+---------+-----------+----------+------------------+ FV Mid   Full           Yes      Yes                                     +---------+---------------+---------+-----------+----------+------------------+  FV DistalFull           Yes      Yes                                     +---------+---------------+---------+-----------+----------+------------------+ PFV      Full                                                            +---------+---------------+---------+-----------+----------+------------------+ POP      Full           Yes      Yes                                     +---------+---------------+---------+-----------+----------+------------------+ PTV                                                    Not seen on this                                                         exam               +---------+---------------+---------+-----------+----------+------------------+ PERO                                                  Not seen on this                                                         exam               +---------+---------------+---------+-----------+----------+------------------+     Summary: BILATERAL: - No evidence of deep vein thrombosis seen in the lower extremities, bilaterally. - No evidence of superficial venous thrombosis in the lower extremities, bilaterally. -No evidence of popliteal cyst, bilaterally. RIGHT: Subcutaneous edema from distal thigh downwards.  LEFT: - Unable to see posterial tibial and peroneal veins due to tissue properties and continous movement of patient. Subcutaneous edema from distal thigh downward.  *See table(s) above for measurements and observations. Electronically signed by Ruta Hinds MD on 10/16/2020 at 5:52:06 PM.    Final     Microbiology: Recent Results (from the past 240 hour(s))  Culture, blood (routine x 2)     Status: None (Preliminary result)   Collection Time: 10/15/20  2:35 PM   Specimen: BLOOD LEFT FOREARM  Result Value Ref Range Status   Specimen Description   Final    BLOOD LEFT FOREARM Performed at [facility], [address],  [place], [place] [address]    Special Requests   Final    BOTTLES DRAWN AEROBIC AND ANAEROBIC Blood Culture results may not be optimal due to an inadequate volume of blood received in culture bottles Performed at [facility] [address] [place], [place] [address]    Culture   Final    NO GROWTH 4 DAYS Performed at [facility], [place] [address] [place], [place] [address]    Report Status PENDING  Incomplete  Culture, blood (routine x 2)     Status: None (Preliminary result)   Collection Time:  10/15/20  8:40 PM   Specimen: BLOOD  Result Value Ref Range Status   Specimen Description   Final    BLOOD RIGHT ANTECUBITAL Performed at [facility] [address] [place], [place] [address]    Special Requests   Final    BOTTLES DRAWN AEROBIC AND ANAEROBIC Blood Culture adequate volume Performed at [facility] [address], [place], [place] [address]    Culture   Final    NO GROWTH 4 DAYS Performed at [facility], [place] [address] [place], [place] [address]    Report Status PENDING  Incomplete  Urine culture     Status: Abnormal   Collection Time: 10/15/20  8:43 PM   Specimen: Urine, Random  Result Value Ref Range Status   Specimen Description   Final    URINE, RANDOM Performed at [facility] [address] [place], [place] [address]    Special Requests   Final    NONE Performed at [facility], [place] [address] [place], [place] [address]    Culture MULTIPLE SPECIES PRESENT, SUGGEST RECOLLECTION (A)  Final   Report Status 10/17/2020 FINAL  Final  SARS CORONAVIRUS 2 (TAT 6-24 HRS) Nasopharyngeal Nasopharyngeal Swab     Status: None   Collection Time: 10/15/20  9:33 PM   Specimen: Nasopharyngeal Swab  Result Value Ref Range Status   SARS Coronavirus 2 NEGATIVE NEGATIVE Final    Comment: (NOTE) SARS-CoV-2 target nucleic acids are NOT DETECTED.  The SARS-CoV-2 RNA is generally detectable in upper and lower respiratory specimens during the acute phase of infection. Negative results do not preclude SARS-CoV-2 infection, do not rule out co-infections with other pathogens, and should not be used as the sole basis for treatment or other patient management decisions. Negative results must be combined with clinical observations, patient history, and epidemiological information. The expected result is Negative.  Fact Sheet for Patients: SugarRoll.be  Fact  Sheet for Healthcare Providers: https://www.woods-mathews.com/  This test is not yet approved or cleared by the [place] FDA and  has been authorized for detection and/or diagnosis of SARS-CoV-2 by FDA under an Emergency Use Authorization (EUA). This EUA will remain  in effect (meaning this test can be used) for the duration of the COVID-19 declaration under Se ction 564(b)(1) of the Act, 21 U.S.C. section 360bbb-3(b)(1), unless the authorization is terminated or revoked sooner.  Performed at [facility], [place] [address], [place], [place] [address]   MRSA PCR Screening     Status: None   Collection Time: 10/17/20  1:22 PM   Specimen: Nasopharyngeal  Result Value Ref Range Status   MRSA by PCR NEGATIVE NEGATIVE Final    Comment:        The GeneXpert MRSA Assay (FDA approved for NASAL specimens only), is one component of a comprehensive MRSA colonization surveillance  program. It is not intended to diagnose MRSA infection nor to guide or monitor treatment for MRSA infections. Performed at [facility], [place] [address] [place],  [address]      Labs: Basic Metabolic Panel: Recent Labs  Lab 10/15/20 1415 10/15/20 1430 10/16/20 0335 10/17/20 0841 10/18/20 0405 10/19/20 0402 10/20/20 0346 10/21/20 0308  NA 138   < > 142 143 143 143 145 145  K 3.4*   < > 3.8 4.2 3.8 4.0 3.7 3.3*  CL 106   < > 112* 113* 114* 116* 116* 114*  CO2 22  --  21* 17* 21* 22 21* 24  GLUCOSE 88   < > 78 93 101* 73 65* 74  BUN 23   < > 22 16 16 17 20 19   CREATININE 0.88   < > 0.79 0.65 0.66 0.70 0.80 0.80  CALCIUM 9.6  --  9.8 9.8 10.1 10.2 9.9 10.2  MG 1.5*  --  2.0 2.0  --   --   --   --   PHOS  --   --   --  2.6  --   --   --   --    < > = values in this interval not displayed.   Liver Function Tests: Recent Labs  Lab 10/17/20 0841 10/18/20 0405 10/19/20 0402 10/20/20 0346 10/21/20 0308  AST 40 35 32 30 32  ALT 21 20 15 12 13   ALKPHOS  91 87 75 76 87  BILITOT 0.7 0.8 0.9 1.1 1.0  PROT 4.9* 4.7* 4.8* 4.9* 5.1*  ALBUMIN 2.1* 2.0* 2.0* 2.0* 2.2*   No results for input(s): LIPASE, AMYLASE in the last 168 hours. No results for input(s): AMMONIA in the last 168 hours. CBC: Recent Labs  Lab 10/15/20 1415 10/15/20 1430 10/17/20 0841 10/18/20 0405 10/19/20 0402 10/20/20 0346 10/21/20 0308  WBC 15.6*   < > 11.5* 8.9 8.8 7.1 10.0  NEUTROABS 13.4*  --   --   --  6.9 4.9 7.2  HGB 11.3*   < > 11.1* 11.0* 10.4* 10.4* 10.4*  HCT 34.0*   < > 33.9* 33.5* 32.7* 32.3* 32.2*  MCV 99.7   < > 101.2* 100.3* 102.8* 102.9* 101.6*  PLT 316   < > 278 247 248 234 236   < > = values in this interval not displayed.   Cardiac Enzymes: Recent Labs  Lab 10/15/20 1415  CKTOTAL 363   BNP: BNP (last 3 results) Recent Labs    10/15/20 2023  BNP 305.1*    ProBNP (last 3 results) No results for input(s): PROBNP in the last 8760 hours.  CBG: No results for input(s): GLUCAP in the last 168 hours.     Signed:  Nita Sells MD   [facility] 10/21/2020, 2:55 PM

## 2020-10-22 NOTE — Progress Notes (Signed)
  PROGRESS NOTE  Patient stable for discharge to SNF with palliative care services to follow.  Per TOC notes, currently awaiting insurance authorization.  Confirmed with wife outside of patient's room as well.  Patient evaluated in the room, trying to get out of bed but is redirectable.  Seems to be confused which sounds like has been his baseline.  Please see Dr. Arlyss Queen discharge summary from 2/26 DNR and Rx signed in shadow chart   Ian Phi, DO [facility] 10/22/2020, 1:07 PM  Available via Epic secure chat 7am-7pm After these hours, please refer to coverage provider listed on [email or web address]

## 2020-10-22 NOTE — Progress Notes (Addendum)
..   Transition of Care [facility]) - Emergency Department Mini Assessment   Patient Details  Name: Ian [name] MRN: [id number] Date of Birth: 06-Feb-1926  Transition of [facility]) CM/SW Contact:    Muhsin Doris C Tarpley-Carter, [place] Phone Number: 10/22/2020, 1:29 PM   Clinical Narrative: TOC CM/CSW reached out to pts insurance.  The following insurance numbers are as follows:  [phone]--DQQIWLN did not have coverage.   [phone]--Patient did not have Coverage.  Bernadene Bell Portal--CSW could not verify pt in portal.   [phone]--EYCXKGYJE # [id number] CSW spoke with Vangie Bicker.  Jenny Reichmann verified that pt is covered and she could not verify auth # [id number].  CSW could not verify auth # [id number].  Brandin Dilday Tarpley-Carter, MSW, LCSW-A Pronouns:  She, Her, [place] ED Transitions of CareClinical Social Worker [email or web address] [phone]    ED Mini Assessment:    Barriers to Discharge: No Barriers Identified     Means of departure: Ambulance  Interventions which prevented an admission or readmission: SNF Placement,Other (must enter comment) [employer] authorization)    Patient Adult nurse 1: [employer] Provider   Spoke with: Barrington Ellison Date: 10/22/20,     Contact Phone Number: [phone] Call outcome: Currently, awaiting auth approval.    CMS Medicare.gov Compare Post Acute Care list provided to:: Patient Choice offered to / list presented to : Patient  Admission diagnosis:  Fracture of anterior arch of C1 ([place]) [H20.947S] Patient Active Problem List   Diagnosis Date Noted  . Fracture of anterior arch of C1 ([place]) 10/15/2020  . Severe sepsis ([place]) 10/15/2020  . Cellulitis of left lower extremity 10/15/2020  . Acute metabolic encephalopathy 96/28/3662  . Anemia 02/12/2018  . Duodenal ulcer 02/12/2018  . History of stroke 02/12/2018  . ETOH abuse 02/12/2018  . Right foot drop 02/12/2018   . Dyslipidemia 11/16/2017  . GI bleed due to NSAIDs 11/09/2017  . Melena 11/09/2017  . Acute blood loss anemia 11/09/2017  . GI bleed 11/09/2017  . Peptic ulcer disease   . Dysphagia, post-stroke   . Essential hypertension   . Embolic cerebral infarction ([place]) 10/29/2017  . Dementia without behavioral disturbance ([place])   . Benign essential HTN   . Diastolic dysfunction   . Hypothyroidism   . Hypokalemia   . Altered mental status   . Pressure injury of skin 10/26/2017  . Loss of weight 10/26/2017  . Elevated troponin I level 10/26/2017  . Leukocytosis 10/26/2017  . Multiple leg contusions, unspecified laterality, sequela 10/26/2017  . Cognitive impairment 10/26/2017  . Scalp hematoma, subsequent encounter 10/26/2017  . Head injury 10/26/2017  . Fall at home, sequela 10/26/2017  . Right BBB/left ant fasc block 10/26/2017  . Acute confusional state 10/26/2017  . Dizziness and giddiness 10/26/2017  . Cerebral infarction [facility]), right occipital lobe 10/26/2017  . Elevated AST (SGOT) 10/26/2017  . Shoulder injury, right, subsequent encounter 10/26/2017  . Paranasal sinus disease 10/26/2017  . Maxillary sinusitis 10/26/2017  . Ethmoid sinusitis 10/26/2017  . Sphenoid sinusitis 10/26/2017  . Rhabdomyolysis 10/25/2017  . Neuropathy 03/31/2013   PCP:  Bernerd Limbo, MD Pharmacy:   [place], [place] [address] Phone: [phone] Fax: [phone]

## 2020-10-23 NOTE — Progress Notes (Signed)
Physical Therapy Treatment Patient Details Name: Ian [name] MRN: [id number] DOB: 09/12/1925 Today's Date: 10/23/2020    History of Present Illness 87 y.o. male with medical history significant for history of CVA on Plavix, hypertension, hyperlipidemia, hypothyroidism, RBBB, alcohol use disorder, dementia who was brought to the ED via EMS for evaluation of confusion. Pt had a fall at home. Imaging showed dens fx and L1 fx. Dx of severe sepsis.    PT Comments    Max assist for bed mobility and sit to stand. In standing pt was able to take 3 side steps at edge of bed with RW with back of LEs supported by bed, he was unable to advance LEs forward to step into RW. BLEs began to buckle with attempted stand pivot transfer, so returned to bed. Performed seated BUE/LE strengthening exercises. SNF recommended.    Follow Up Recommendations  SNF;Supervision/Assistance - 24 hour;Supervision for mobility/OOB     Equipment Recommendations  None recommended by PT    Recommendations for Other Services       Precautions / Restrictions Precautions Precautions: Fall;Cervical Required Braces or Orthoses: Cervical Brace Cervical Brace: Hard collar Restrictions Weight Bearing Restrictions: No    Mobility  Bed Mobility Overal bed mobility: Needs Assistance Bed Mobility: Rolling;Sidelying to Sit;Sit to Supine Rolling: Max assist Sidelying to sit: Max assist   Sit to supine: Max assist   General bed mobility comments: verbal and manual cues for log roll, assist to initiate roll and then to raise trunk    Transfers Overall transfer level: Needs assistance Equipment used: Rolling walker (2 wheeled) Transfers: Sit to/from Stand Sit to Stand: Mod assist;From elevated surface         General transfer comment: sit to stand x 3 trials from elevated bed, pt stood with RW and was able to take 3 side steps towards head of bed with back of legs supported on bed but was unable to take a step forward  away from support of the bed into RW, attempted bear hug SPT but once in partial standing BLEs began to buckle so did not pivot  Ambulation/Gait Ambulation/Gait assistance: Min assist Gait Distance (Feet): 2 Feet Assistive device: Rolling walker (2 wheeled)       General Gait Details: 3 side steps at edge of bed with RW with back of legs supported by bed, unable to step forward away from support of bed   Stairs             Wheelchair Mobility    Modified Rankin (Stroke Patients Only)       Balance Overall balance assessment: History of Falls;Needs assistance Sitting-balance support: Feet supported Sitting balance-Leahy Scale: Fair     Standing balance support: Bilateral upper extremity supported Standing balance-Leahy Scale: Poor Standing balance comment: reliant on UE support and mod A                            Cognition Arousal/Alertness: Awake/alert Behavior During Therapy: WFL for tasks assessed/performed Overall Cognitive Status: History of cognitive impairments - at baseline                                 General Comments: able to follow commands with increased time & with verbal/manual cues, h/o dementa      Exercises General Exercises - Upper Extremity Shoulder Flexion: AAROM;Both;10 reps;Seated General Exercises - Lower Extremity Long Arc Quad:  AROM;Both;10 reps;Seated Hip Flexion/Marching: AROM;Both;10 reps;Seated    General Comments        Pertinent Vitals/Pain Pain Assessment: No/denies pain    Home Living                      Prior Function            PT Goals (current goals can now be found in the care plan section) Acute Rehab PT Goals Patient Stated Goal: agreeable to get to sit and attempt standing PT Goal Formulation: With family Time For Goal Achievement: 11/01/20 Potential to Achieve Goals: Fair Progress towards PT goals: Progressing toward goals    Frequency    Min 2X/week       PT Plan Current plan remains appropriate    Co-evaluation              AM-PAC PT "6 Clicks" Mobility   Outcome Measure  Help needed turning from your back to your side while in a flat bed without using bedrails?: A Lot Help needed moving from lying on your back to sitting on the side of a flat bed without using bedrails?: A Lot Help needed moving to and from a bed to a chair (including a wheelchair)?: A Lot Help needed standing up from a chair using your arms (e.g., wheelchair or bedside chair)?: A Lot Help needed to walk in hospital room?: Total Help needed climbing 3-5 steps with a railing? : Total 6 Click Score: 10    End of Session Equipment Utilized During Treatment: Gait belt;Oxygen Activity Tolerance: Patient limited by fatigue Patient left: in bed;with call bell/phone within reach;with family/visitor present;with bed alarm set Nurse Communication: Mobility status (IV came out, condom cath not attached upon arrival to room) PT Visit Diagnosis: Difficulty in walking, not elsewhere classified (R26.2)     Time: 9480-1655 PT Time Calculation (min) (ACUTE ONLY): 25 min  Charges:  $Therapeutic Exercise: 8-22 mins $Therapeutic Activity: 8-22 mins                     Blondell Reveal Kistler PT 10/23/2020  Acute Rehabilitation Services Pager [phone] Office [phone]

## 2020-10-23 NOTE — Care Management Important Message (Signed)
Medicare IM printed for Social work team at [facility] to give to the patient. °

## 2020-10-23 NOTE — TOC Progression Note (Signed)
Transition of Care [facility]) - Progression Note    Patient Details  Name: Zaven Klemens MRN: [id number] Date of Birth: 02-Mar-1926  Transition of Care [facility]) CM/SW Contact  Joaquin Courts, RN Phone Number: 10/23/2020, 2:20 PM  Clinical Narrative:    CM followed up on Insurance Authorization status, per Candace Cruise is still pending and they request updated clinicals.  CM reached out to PT department and requested for patient to be seen today.    Updated PT notes and progress notes faxed to [facility].     Expected Discharge Plan: Skilled Nursing Facility Barriers to Discharge: Insurance Authorization  Expected Discharge Plan and Services Expected Discharge Plan: [place]   Discharge Planning Services: CM Consult   Living arrangements for the past 2 months: Single Family Home Expected Discharge Date: 10/22/20                                     Social Determinants of Health (SDOH) Interventions    Readmission Risk Interventions No flowsheet data found.

## 2020-10-23 NOTE — Progress Notes (Signed)
  PROGRESS NOTE  Wife at bedside, no acute events overnight.  Remains stable for discharge to SNF placement once insurance authorization available.   Dessa Phi, DO [facility] 10/23/2020, 10:35 AM  Available via Epic secure chat 7am-7pm After these hours, please refer to coverage provider listed on [email or web address]

## 2020-10-24 NOTE — Progress Notes (Signed)
  PROGRESS NOTE  Patient remains stable, resting comfortably in bed in c-collar.  Ready for discharge to SNF.  Discharge summary updated to reflect today's discharge date.   Dessa Phi, DO [facility] 10/24/2020, 9:54 AM  Available via Epic secure chat 7am-7pm After these hours, please refer to coverage provider listed on [email or web address]

## 2020-10-24 NOTE — Progress Notes (Signed)
Called report to [facility] and gave report to Talmage Coin the nurse receiving the patient.   Went over discharge instructions w/ patient wife. Pt wife verbalized understanding.

## 2020-10-24 NOTE — TOC Transition Note (Signed)
Transition of Care [facility]) - CM/SW Discharge Note   Patient Details  Name: Ian [name] MRN: [id number] Date of Birth: June 28, 1926  Transition of Care [facility]) CM/SW Contact:  Trish Mage, LCSW Phone Number: 10/24/2020, 10:11 AM   Clinical Narrative:  Patient who was stable for d/c since Saturday has been waiting for insurance authorization.  Authorization received.  [employer] reference # [id number]. Good for three days. 2/28-3/2.  Review with P Naim.  FAX clinicals to [phone].  Teena with [facility] confirms bed availability today.  Family informed.  PTAR arranged.  Nursing, please call report to [phone], room 127A. TOC sign off.    Final next level of care: Skilled Nursing Facility Barriers to Discharge: Barriers Resolved   Patient Goals and CMS Choice   CMS Medicare.gov Compare Post Acute Care list provided to:: Patient Choice offered to / list presented to : Patient  Discharge Placement                       Discharge Plan and Services   Discharge Planning Services: CM Consult                                 Social Determinants of Health (SDOH) Interventions     Readmission Risk Interventions No flowsheet data found.

## 2020-10-30 ENCOUNTER — Emergency Department (HOSPITAL_COMMUNITY): Payer: Medicare Other

## 2020-10-30 ENCOUNTER — Other Ambulatory Visit: Payer: Self-pay

## 2020-10-30 ENCOUNTER — Inpatient Hospital Stay (HOSPITAL_COMMUNITY)
Admission: EM | Admit: 2020-10-30 | Discharge: 2020-11-24 | DRG: 871 | Disposition: E | Payer: Medicare Other | Attending: Critical Care Medicine | Admitting: Critical Care Medicine

## 2020-10-30 ENCOUNTER — Inpatient Hospital Stay (HOSPITAL_COMMUNITY): Payer: Medicare Other

## 2020-10-30 ENCOUNTER — Inpatient Hospital Stay: Payer: Self-pay

## 2020-10-30 DIAGNOSIS — E876 Hypokalemia: Secondary | ICD-10-CM | POA: Diagnosis present

## 2020-10-30 DIAGNOSIS — N179 Acute kidney failure, unspecified: Secondary | ICD-10-CM | POA: Diagnosis present

## 2020-10-30 DIAGNOSIS — Z8673 Personal history of transient ischemic attack (TIA), and cerebral infarction without residual deficits: Secondary | ICD-10-CM | POA: Diagnosis not present

## 2020-10-30 DIAGNOSIS — Z9911 Dependence on respirator [ventilator] status: Secondary | ICD-10-CM | POA: Diagnosis not present

## 2020-10-30 DIAGNOSIS — J189 Pneumonia, unspecified organism: Secondary | ICD-10-CM | POA: Diagnosis present

## 2020-10-30 DIAGNOSIS — N390 Urinary tract infection, site not specified: Secondary | ICD-10-CM | POA: Diagnosis present

## 2020-10-30 DIAGNOSIS — Z20822 Contact with and (suspected) exposure to covid-19: Secondary | ICD-10-CM | POA: Diagnosis present

## 2020-10-30 DIAGNOSIS — S129XXD Fracture of neck, unspecified, subsequent encounter: Secondary | ICD-10-CM

## 2020-10-30 DIAGNOSIS — A419 Sepsis, unspecified organism: Principal | ICD-10-CM | POA: Diagnosis present

## 2020-10-30 DIAGNOSIS — E440 Moderate protein-calorie malnutrition: Secondary | ICD-10-CM | POA: Diagnosis present

## 2020-10-30 DIAGNOSIS — G9340 Encephalopathy, unspecified: Secondary | ICD-10-CM | POA: Diagnosis present

## 2020-10-30 DIAGNOSIS — Z66 Do not resuscitate: Secondary | ICD-10-CM | POA: Diagnosis present

## 2020-10-30 DIAGNOSIS — N170 Acute kidney failure with tubular necrosis: Secondary | ICD-10-CM | POA: Diagnosis not present

## 2020-10-30 DIAGNOSIS — E739 Lactose intolerance, unspecified: Secondary | ICD-10-CM | POA: Diagnosis present

## 2020-10-30 DIAGNOSIS — W19XXXA Unspecified fall, initial encounter: Secondary | ICD-10-CM | POA: Diagnosis present

## 2020-10-30 DIAGNOSIS — Z515 Encounter for palliative care: Secondary | ICD-10-CM

## 2020-10-30 DIAGNOSIS — E870 Hyperosmolality and hypernatremia: Secondary | ICD-10-CM | POA: Diagnosis present

## 2020-10-30 DIAGNOSIS — E872 Acidosis: Secondary | ICD-10-CM | POA: Diagnosis present

## 2020-10-30 DIAGNOSIS — R6521 Severe sepsis with septic shock: Secondary | ICD-10-CM | POA: Diagnosis present

## 2020-10-30 DIAGNOSIS — R680 Hypothermia, not associated with low environmental temperature: Secondary | ICD-10-CM | POA: Diagnosis present

## 2020-10-30 DIAGNOSIS — I10 Essential (primary) hypertension: Secondary | ICD-10-CM | POA: Diagnosis present

## 2020-10-30 DIAGNOSIS — L89153 Pressure ulcer of sacral region, stage 3: Secondary | ICD-10-CM | POA: Diagnosis present

## 2020-10-30 DIAGNOSIS — J690 Pneumonitis due to inhalation of food and vomit: Secondary | ICD-10-CM | POA: Diagnosis present

## 2020-10-30 DIAGNOSIS — J9690 Respiratory failure, unspecified, unspecified whether with hypoxia or hypercapnia: Secondary | ICD-10-CM | POA: Diagnosis present

## 2020-10-30 DIAGNOSIS — D649 Anemia, unspecified: Secondary | ICD-10-CM | POA: Diagnosis present

## 2020-10-30 DIAGNOSIS — Z79890 Hormone replacement therapy (postmenopausal): Secondary | ICD-10-CM

## 2020-10-30 DIAGNOSIS — S129XXA Fracture of neck, unspecified, initial encounter: Secondary | ICD-10-CM | POA: Diagnosis present

## 2020-10-30 DIAGNOSIS — I21A1 Myocardial infarction type 2: Secondary | ICD-10-CM | POA: Diagnosis present

## 2020-10-30 DIAGNOSIS — J9601 Acute respiratory failure with hypoxia: Secondary | ICD-10-CM | POA: Diagnosis present

## 2020-10-30 DIAGNOSIS — Z6824 Body mass index (BMI) 24.0-24.9, adult: Secondary | ICD-10-CM

## 2020-10-30 DIAGNOSIS — I4891 Unspecified atrial fibrillation: Secondary | ICD-10-CM | POA: Diagnosis present

## 2020-10-30 DIAGNOSIS — E039 Hypothyroidism, unspecified: Secondary | ICD-10-CM | POA: Diagnosis present

## 2020-10-30 DIAGNOSIS — R34 Anuria and oliguria: Secondary | ICD-10-CM | POA: Diagnosis present

## 2020-10-30 DIAGNOSIS — J9600 Acute respiratory failure, unspecified whether with hypoxia or hypercapnia: Secondary | ICD-10-CM

## 2020-10-30 DIAGNOSIS — Z79899 Other long term (current) drug therapy: Secondary | ICD-10-CM

## 2020-10-30 DIAGNOSIS — J9691 Respiratory failure, unspecified with hypoxia: Secondary | ICD-10-CM | POA: Diagnosis present

## 2020-10-30 DIAGNOSIS — Z7189 Other specified counseling: Secondary | ICD-10-CM | POA: Diagnosis not present

## 2020-10-30 DIAGNOSIS — R739 Hyperglycemia, unspecified: Secondary | ICD-10-CM | POA: Diagnosis present

## 2020-10-30 DIAGNOSIS — J9621 Acute and chronic respiratory failure with hypoxia: Secondary | ICD-10-CM

## 2020-10-30 DIAGNOSIS — F0390 Unspecified dementia without behavioral disturbance: Secondary | ICD-10-CM | POA: Diagnosis present

## 2020-10-30 LAB — URINALYSIS, ROUTINE W REFLEX MICROSCOPIC
Glucose, UA: NEGATIVE mg/dL
Hgb urine dipstick: NEGATIVE
Ketones, ur: NEGATIVE mg/dL
Nitrite: POSITIVE — AB
Protein, ur: 30 mg/dL — AB
Specific Gravity, Urine: >1.03 — ABNORMAL HIGH (ref 1.005–1.030)
pH: 5 (ref 5.0–8.0)

## 2020-10-30 LAB — COMPREHENSIVE METABOLIC PANEL
ALT: 22 U/L (ref 0–44)
ALT: 25 U/L (ref 0–44)
AST: 42 U/L — ABNORMAL HIGH (ref 15–41)
AST: 55 U/L — ABNORMAL HIGH (ref 15–41)
Albumin: 1.7 g/dL — ABNORMAL LOW (ref 3.5–5.0)
Albumin: 1.8 g/dL — ABNORMAL LOW (ref 3.5–5.0)
Alkaline Phosphatase: 89 U/L (ref 38–126)
Alkaline Phosphatase: 86 U/L (ref 38–126)
Anion gap: 12 (ref 5–15)
Anion gap: 14 (ref 5–15)
BUN: 42 mg/dL — ABNORMAL HIGH (ref 8–23)
BUN: 42 mg/dL — ABNORMAL HIGH (ref 8–23)
CO2: 20 mmol/L — ABNORMAL LOW (ref 22–32)
CO2: 20 mmol/L — ABNORMAL LOW (ref 22–32)
Calcium: 11.4 mg/dL — ABNORMAL HIGH (ref 8.9–10.3)
Calcium: 11.4 mg/dL — ABNORMAL HIGH (ref 8.9–10.3)
Chloride: 120 mmol/L — ABNORMAL HIGH (ref 98–111)
Chloride: 116 mmol/L — ABNORMAL HIGH (ref 98–111)
Creatinine, Ser: 1.87 mg/dL — ABNORMAL HIGH (ref 0.61–1.24)
Creatinine, Ser: 1.99 mg/dL — ABNORMAL HIGH (ref 0.61–1.24)
GFR, Estimated: 33 mL/min — ABNORMAL LOW (ref >60)
GFR, Estimated: 31 mL/min — ABNORMAL LOW (ref >60)
Glucose, Bld: 75 mg/dL (ref 70–99)
Glucose, Bld: 109 mg/dL — ABNORMAL HIGH (ref 70–99)
Potassium: 4 mmol/L (ref 3.5–5.1)
Potassium: 3.4 mmol/L — ABNORMAL LOW (ref 3.5–5.1)
Sodium: 152 mmol/L — ABNORMAL HIGH (ref 135–145)
Sodium: 150 mmol/L — ABNORMAL HIGH (ref 135–145)
Total Bilirubin: 2.1 mg/dL — ABNORMAL HIGH (ref 0.3–1.2)
Total Bilirubin: 2 mg/dL — ABNORMAL HIGH (ref 0.3–1.2)
Total Protein: 5.1 g/dL — ABNORMAL LOW (ref 6.5–8.1)
Total Protein: 5.4 g/dL — ABNORMAL LOW (ref 6.5–8.1)

## 2020-10-30 LAB — CBC WITH DIFFERENTIAL/PLATELET
Abs Immature Granulocytes: 0.18 10*3/uL — ABNORMAL HIGH (ref 0.00–0.07)
Basophils Absolute: 0.1 10*3/uL (ref 0.0–0.1)
Basophils Relative: 1 %
Eosinophils Absolute: 0 10*3/uL (ref 0.0–0.5)
Eosinophils Relative: 0 %
HCT: 41.6 % (ref 39.0–52.0)
Hemoglobin: 12.7 g/dL — ABNORMAL LOW (ref 13.0–17.0)
Immature Granulocytes: 1 %
Lymphocytes Relative: 3 %
Lymphs Abs: 0.5 10*3/uL — ABNORMAL LOW (ref 0.7–4.0)
MCH: 32.4 pg (ref 26.0–34.0)
MCHC: 30.5 g/dL (ref 30.0–36.0)
MCV: 106.1 fL — ABNORMAL HIGH (ref 80.0–100.0)
Monocytes Absolute: 0.5 10*3/uL (ref 0.1–1.0)
Monocytes Relative: 3 %
Neutro Abs: 16.6 10*3/uL — ABNORMAL HIGH (ref 1.7–7.7)
Neutrophils Relative %: 92 %
Platelets: 267 10*3/uL (ref 150–400)
RBC: 3.92 MIL/uL — ABNORMAL LOW (ref 4.22–5.81)
RDW: 15.3 % (ref 11.5–15.5)
WBC: 17.8 10*3/uL — ABNORMAL HIGH (ref 4.0–10.5)
nRBC: 0.3 % — ABNORMAL HIGH (ref 0.0–0.2)

## 2020-10-30 LAB — RESP PANEL BY RT-PCR (FLU A&B, COVID) ARPGX2
Influenza A by PCR: NEGATIVE
Influenza B by PCR: NEGATIVE
SARS Coronavirus 2 by RT PCR: NEGATIVE

## 2020-10-30 LAB — I-STAT ARTERIAL BLOOD GAS, ED
Acid-base deficit: 1 mmol/L (ref 0.0–2.0)
Bicarbonate: 24 mmol/L (ref 20.0–28.0)
Calcium, Ion: 1.64 mmol/L (ref 1.15–1.40)
HCT: 33 % — ABNORMAL LOW (ref 39.0–52.0)
Hemoglobin: 11.2 g/dL — ABNORMAL LOW (ref 13.0–17.0)
O2 Saturation: 98 %
Potassium: 3.5 mmol/L (ref 3.5–5.1)
Sodium: 153 mmol/L — ABNORMAL HIGH (ref 135–145)
TCO2: 25 mmol/L (ref 22–32)
pCO2 arterial: 40.8 mmHg (ref 32.0–48.0)
pH, Arterial: 7.378 (ref 7.350–7.450)
pO2, Arterial: 100 mmHg (ref 83.0–108.0)

## 2020-10-30 LAB — CBC
HCT: 42.8 % (ref 39.0–52.0)
Hemoglobin: 14 g/dL (ref 13.0–17.0)
MCH: 33.5 pg (ref 26.0–34.0)
MCHC: 32.7 g/dL (ref 30.0–36.0)
MCV: 102.4 fL — ABNORMAL HIGH (ref 80.0–100.0)
Platelets: 273 10*3/uL (ref 150–400)
RBC: 4.18 MIL/uL — ABNORMAL LOW (ref 4.22–5.81)
RDW: 15.2 % (ref 11.5–15.5)
WBC: 16.6 10*3/uL — ABNORMAL HIGH (ref 4.0–10.5)
nRBC: 0.5 % — ABNORMAL HIGH (ref 0.0–0.2)

## 2020-10-30 LAB — URINALYSIS, MICROSCOPIC (REFLEX): RBC / HPF: NONE SEEN RBC/hpf (ref 0–5)

## 2020-10-30 LAB — HEMOGLOBIN A1C
Hgb A1c MFr Bld: 5.2 % (ref 4.8–5.6)
Mean Plasma Glucose: 102.54 mg/dL

## 2020-10-30 LAB — GLUCOSE, CAPILLARY
Glucose-Capillary: 84 mg/dL (ref 70–99)
Glucose-Capillary: 98 mg/dL (ref 70–99)

## 2020-10-30 LAB — LACTIC ACID, PLASMA
Lactic Acid, Venous: 3.2 mmol/L (ref 0.5–1.9)
Lactic Acid, Venous: 2.7 mmol/L (ref 0.5–1.9)

## 2020-10-30 LAB — PROCALCITONIN: Procalcitonin: 3.67 ng/mL

## 2020-10-30 LAB — CBG MONITORING, ED: Glucose-Capillary: 86 mg/dL (ref 70–99)

## 2020-10-30 LAB — MRSA PCR SCREENING: MRSA by PCR: NEGATIVE

## 2020-10-30 MED ORDER — FENTANYL 2500MCG IN NS 250ML (10MCG/ML) PREMIX INFUSION
0.0000 ug/h | INTRAVENOUS | Status: DC
  Administered 2020-10-30: 25 ug/h via INTRAVENOUS
  Filled 2020-10-30: qty 250

## 2020-10-30 MED ORDER — POLYETHYLENE GLYCOL 3350 17 G PO PACK: 17.0000 g | PACK | Freq: Every day | ORAL | Status: DC | PRN

## 2020-10-30 MED ORDER — DOCUSATE SODIUM 50 MG/5ML PO LIQD
100.0000 mg | Freq: Two times a day (BID) | ORAL | Status: DC
  Administered 2020-11-01 – 2020-11-02 (×3): 100 mg
  Filled 2020-10-30 (×4): qty 10

## 2020-10-30 MED ORDER — FENTANYL CITRATE (PF) 100 MCG/2ML IJ SOLN: 25.0000 ug | INTRAMUSCULAR | Status: DC | PRN

## 2020-10-30 MED ORDER — LEVOTHYROXINE SODIUM 25 MCG PO TABS
50.0000 ug | ORAL_TABLET | Freq: Every day | ORAL | Status: DC
  Administered 2020-11-02 – 2020-11-03 (×2): 50 ug
  Filled 2020-10-30 (×2): qty 2

## 2020-10-30 MED ORDER — VANCOMYCIN HCL 1500 MG/300ML IV SOLN
1500.0000 mg | Freq: Once | INTRAVENOUS | Status: AC
  Administered 2020-10-30: 1500 mg via INTRAVENOUS
  Filled 2020-10-30: qty 300

## 2020-10-30 MED ORDER — PIPERACILLIN-TAZOBACTAM 3.375 G IVPB 30 MIN
3.3750 g | Freq: Once | INTRAVENOUS | Status: AC
  Administered 2020-10-30: 3.375 g via INTRAVENOUS
  Filled 2020-10-30: qty 50

## 2020-10-30 MED ORDER — ASPIRIN 300 MG RE SUPP
300.0000 mg | Freq: Once | RECTAL | Status: AC
  Administered 2020-10-30: 300 mg via RECTAL
  Filled 2020-10-30: qty 1

## 2020-10-30 MED ORDER — ETOMIDATE 2 MG/ML IV SOLN
INTRAVENOUS | Status: AC | PRN
  Administered 2020-10-30: 20 mg via INTRAVENOUS

## 2020-10-30 MED ORDER — NOREPINEPHRINE 4 MG/250ML-% IV SOLN
INTRAVENOUS | Status: AC | PRN
  Administered 2020-10-30: 10 ug/min via INTRAVENOUS

## 2020-10-30 MED ORDER — POLYETHYLENE GLYCOL 3350 17 G PO PACK
17.0000 g | PACK | Freq: Every day | ORAL | Status: DC
  Administered 2020-11-01 – 2020-11-02 (×2): 17 g
  Filled 2020-10-30 (×2): qty 1

## 2020-10-30 MED ORDER — PANTOPRAZOLE SODIUM 40 MG IV SOLR
40.0000 mg | Freq: Every day | INTRAVENOUS | Status: DC
  Administered 2020-10-30 – 2020-10-31 (×2): 40 mg via INTRAVENOUS
  Filled 2020-10-30 (×2): qty 40

## 2020-10-30 MED ORDER — SODIUM CHLORIDE 0.9 % IV SOLN
INTRAVENOUS | Status: AC | PRN
  Administered 2020-10-30: 1000 mL via INTRAVENOUS

## 2020-10-30 MED ORDER — HEPARIN SODIUM (PORCINE) 5000 UNIT/ML IJ SOLN
5000.0000 [IU] | Freq: Three times a day (TID) | INTRAMUSCULAR | Status: DC
  Administered 2020-10-30 – 2020-11-03 (×11): 5000 [IU] via SUBCUTANEOUS
  Filled 2020-10-30 (×11): qty 1

## 2020-10-30 MED ORDER — FENTANYL CITRATE (PF) 100 MCG/2ML IJ SOLN: 25.0000 ug | Freq: Once | INTRAMUSCULAR | Status: DC

## 2020-10-30 MED ORDER — PIPERACILLIN-TAZOBACTAM 3.375 G IVPB
3.3750 g | Freq: Three times a day (TID) | INTRAVENOUS | Status: DC
  Administered 2020-10-31: 3.375 g via INTRAVENOUS
  Filled 2020-10-30 (×3): qty 50

## 2020-10-30 MED ORDER — EPINEPHRINE 0.1 MG/10ML (10 MCG/ML) SYRINGE FOR IV PUSH (FOR BLOOD PRESSURE SUPPORT)
PREFILLED_SYRINGE | INTRAVENOUS | Status: AC | PRN
  Administered 2020-10-30: 20 ug via INTRAVENOUS

## 2020-10-30 MED ORDER — FENTANYL BOLUS VIA INFUSION
25.0000 ug | INTRAVENOUS | Status: DC | PRN
  Administered 2020-10-31 – 2020-11-03 (×8): 25 ug via INTRAVENOUS
  Filled 2020-10-30: qty 25

## 2020-10-30 MED ORDER — DEXMEDETOMIDINE HCL IN NACL 400 MCG/100ML IV SOLN
0.4000 ug/kg/h | INTRAVENOUS | Status: DC
  Administered 2020-10-31: 0.4 ug/kg/h via INTRAVENOUS
  Administered 2020-10-31 (×2): 0.5 ug/kg/h via INTRAVENOUS
  Administered 2020-11-01 – 2020-11-02 (×3): 0.4 ug/kg/h via INTRAVENOUS
  Filled 2020-10-30 (×7): qty 100

## 2020-10-30 MED ORDER — VANCOMYCIN VARIABLE DOSE PER UNSTABLE RENAL FUNCTION (PHARMACIST DOSING): Status: DC

## 2020-10-30 MED ORDER — DOCUSATE SODIUM 100 MG PO CAPS: 100.0000 mg | ORAL_CAPSULE | Freq: Two times a day (BID) | ORAL | Status: DC | PRN

## 2020-10-30 MED ORDER — DEXTROSE 5 % IV SOLN: INTRAVENOUS | Status: DC

## 2020-10-30 MED ORDER — DOCUSATE SODIUM 50 MG/5ML PO LIQD: 100.0000 mg | Freq: Two times a day (BID) | ORAL | Status: DC | PRN

## 2020-10-30 MED ORDER — ORAL CARE MOUTH RINSE
15.0000 mL | OROMUCOSAL | Status: DC
  Administered 2020-10-30 – 2020-11-03 (×34): 15 mL via OROMUCOSAL

## 2020-10-30 MED ORDER — INSULIN ASPART 100 UNIT/ML ~~LOC~~ SOLN
0.0000 [IU] | SUBCUTANEOUS | Status: DC
  Administered 2020-11-02: 1 [IU] via SUBCUTANEOUS
  Administered 2020-11-02: 2 [IU] via SUBCUTANEOUS
  Administered 2020-11-02 (×2): 1 [IU] via SUBCUTANEOUS
  Administered 2020-11-03 (×2): 2 [IU] via SUBCUTANEOUS

## 2020-10-30 MED ORDER — CHLORHEXIDINE GLUCONATE CLOTH 2 % EX PADS
6.0000 | MEDICATED_PAD | Freq: Every day | CUTANEOUS | Status: DC
  Administered 2020-10-30 – 2020-11-02 (×4): 6 via TOPICAL

## 2020-10-30 MED ORDER — ACETAMINOPHEN 650 MG RE SUPP
650.0000 mg | RECTAL | Status: DC | PRN
Start: 2020-10-30 — End: 2020-11-01
  Administered 2020-10-31: 650 mg via RECTAL
  Filled 2020-10-30: qty 1

## 2020-10-30 MED ORDER — PIPERACILLIN-TAZOBACTAM 3.375 G IVPB 30 MIN
3.3750 g | Freq: Once | INTRAVENOUS | Status: DC
  Filled 2020-10-30: qty 50

## 2020-10-30 MED ORDER — SODIUM CHLORIDE 0.9 % IV SOLN
250.0000 mL | INTRAVENOUS | Status: DC
  Administered 2020-10-30 – 2020-11-01 (×2): 250 mL via INTRAVENOUS

## 2020-10-30 MED ORDER — NOREPINEPHRINE 4 MG/250ML-% IV SOLN
0.0000 ug/min | INTRAVENOUS | Status: DC
  Administered 2020-10-30: 7.5 ug/min via INTRAVENOUS

## 2020-10-30 MED ORDER — LEVOTHYROXINE SODIUM 50 MCG PO TABS: 50.0000 ug | ORAL_TABLET | Freq: Every day | ORAL | Status: DC

## 2020-10-30 MED ORDER — NOREPINEPHRINE 4 MG/250ML-% IV SOLN
2.0000 ug/min | INTRAVENOUS | Status: DC
  Administered 2020-10-30: 10 ug/min via INTRAVENOUS
  Administered 2020-10-31: 8 ug/min via INTRAVENOUS
  Administered 2020-10-31: 9 ug/min via INTRAVENOUS
  Filled 2020-10-30 (×3): qty 250

## 2020-10-30 MED ORDER — FENTANYL 2500MCG IN NS 250ML (10MCG/ML) PREMIX INFUSION
25.0000 ug/h | INTRAVENOUS | Status: DC
  Administered 2020-10-30: 30 ug/h via INTRAVENOUS
  Administered 2020-10-31: 25 ug/h via INTRAVENOUS
  Administered 2020-10-31: 12.5 ug/h via INTRAVENOUS
  Administered 2020-11-02: 50 ug/h via INTRAVENOUS
  Filled 2020-10-30 (×2): qty 250

## 2020-10-30 MED ORDER — ROCURONIUM BROMIDE 50 MG/5ML IV SOLN
INTRAVENOUS | Status: AC | PRN
  Administered 2020-10-30: 100 mg via INTRAVENOUS

## 2020-10-30 MED ORDER — CLINDAMYCIN PHOSPHATE 300 MG/50ML IV SOLN: 300.0000 mg | Freq: Once | INTRAVENOUS | Status: DC

## 2020-10-30 MED ORDER — CHLORHEXIDINE GLUCONATE 0.12% ORAL RINSE (MEDLINE KIT)
15.0000 mL | Freq: Two times a day (BID) | OROMUCOSAL | Status: DC
  Administered 2020-10-30 – 2020-11-03 (×8): 15 mL via OROMUCOSAL

## 2020-10-30 NOTE — H&P (Addendum)
NAME:  Leanord Thibeau, MRN:  [id number], DOB:  03-Sep-1925, LOS: 0 ADMISSION DATE:  11/20/2020, CONSULTATION DATE:  10/31/2020 REFERRING MD:  EDP, CHIEF COMPLAINT:  Respiratory distress   Brief History:  87 y.o. M who presented from [place] after a choking episode while eating and progressive altered mental status.  He was intubated and covered with broad spectrum antibiotics and PCCM consulted for admission  History of Present Illness:  87 y.o. M with PMH of HTN, stroke and recent admission after a fall and PNA, discharged March 1st to [place].   On 3/7 he was noted to have a choking episode while eating and had progressively worsening mental status and oxygenation and so was brought to the ED and required bagging en route.   He was intubated in the ED, CXR with bilateral airspace disease and effusions.  Labs significant for WBC 17.8, lactic acid and metabolic panel still pending.  His wife was present and confirmed DNR, but did want intubation.  He was started on Zosyn and PCCM consulted for admission   Past Medical History:   has a past medical history of Benign neoplasm of parathyroid gland (03/31/2013), Calculus of kidney (03/31/2013), Hypertension, Leukocytosis (10/26/2017), Loss of weight (10/26/2017), Multiple leg contusions, unspecified laterality, sequela (10/26/2017), Scalp hematoma, subsequent encounter (10/26/2017), and Stroke ([place]).   Significant Hospital Events:  3/7 Admit to PCCM  Consults:    Procedures:  ETT 3/7  Significant Diagnostic Tests:  3/7 CXR>>Bilateral airspace disease and effusions, compatible with edema.  Micro Data:  3/7 Covid, Flu>>pending 3/7 BCx2>> 3/7 urine culture>>  Antimicrobials:  Zosyn 3/7- Vancomycin 3/7-  Interim History / Subjective:  Pt intubated and required Levophed to maintain MAP >65  Met with wife at the bedside, code status confirmed that patient is intubate only and is a do not resuscitate.   Objective   Blood pressure 122/74,  pulse (!) 106, temperature 99.2 F (37.3 C), resp. rate 18, height $RemoveBe'5\' 11"'iIOBQZBxH$  (1.803 m), SpO2 100 %.    Vent Mode: PRVC FiO2 (%):  [100 %] 100 % Set Rate:  [18 bmp] 18 bmp Vt Set:  [600 mL] 600 mL PEEP:  [5 cmH20] 5 cmH20 Plateau Pressure:  [16 cmH20] 16 cmH20  No intake or output data in the 24 hours ending 10/24/2020 1707 There were no vitals filed for this visit.  General: elderly male, c-collar in place, intubated, sedated HEENT: moist mucous membranes, Dix/AT, pupils pinpoint Neuro: sedated CV: tachycardic, s1s2, no m/r/g PULM:  Course breath sounds bilaterally GI: soft, non-distended, BS active  Extremities: warm/dry, no edema  Skin: no rashes or lesions  Resolved Hospital Problem list     Assessment & Plan:   Acute Hypoxemic Respiratory Failure in setting of known aspiration risk - Intubated in the ED, wife confirmed DNR but wanted intubation P:  -admit to ICU -empiric Vanc/Zosyn given recent hospitalization to cover for HCAP - follow culture results -Maintain full vent support with SAT/SBT as tolerated - Precedex and fentanyl for sedation/analgesia -titrate Vent setting to maintain SpO2 greater than or equal to 90%. -HOB elevated 30 degrees. -Plateau pressures less than 30 cm H20.  -Follow chest x-ray, ABG prn.   -Bronchial hygiene and RT/bronchodilator protocol.  Septic Shock secondary to aspiration pneumonia vs UTI - Continue broad spectrum antibiotics as above - Continue peripheral levophed, if requires higher doses will order a PICC line - Follow up cultures - Check lactic acid  Acute Kidney Injury Hypernatremia - baseline creatinine 0.8, 1.87 on  admission - 4.1L free water deficit.  - Start D5W at 130mL/hr - Bolus D5W as needed   NSTEMI  In setting of sepsis,likely demand  - Trend EKGs and troponins  - Give aspirin $RemoveBefo'325mg'JhzMPGxHSgL$  PR - monitor for now  Best practice (evaluated daily)  Diet: NPO Pain/Anxiety/Delirium protocol (if indicated): Fentanyl,  precedex VAP protocol (if indicated): HOB 30 degrees, suction prn DVT prophylaxis: heparin GI prophylaxis: PPI Glucose control: SSI Mobility: bed rest Disposition: ICU  Goals of Care:  Last date of multidisciplinary goals of care discussion: Family and staff present:  Summary of discussion:  Follow up goals of care discussion due: 3/14 Code Status: DNR  Labs   CBC: Recent Labs  Lab 11/08/2020 1633 11/22/2020 1635  WBC  --  17.8*  NEUTROABS  --  PENDING  HGB 11.2* 12.7*  HCT 33.0* 41.6  MCV  --  106.1*  PLT  --  300    Basic Metabolic Panel: Recent Labs  Lab 10/29/2020 1633  NA 153*  K 3.5   GFR: Estimated Creatinine Clearance: 62.8 mL/min (by C-G formula based on SCr of 0.8 mg/dL). Recent Labs  Lab 10/29/2020 1635  WBC 17.8*    Liver Function Tests: No results for input(s): AST, ALT, ALKPHOS, BILITOT, PROT, ALBUMIN in the last 168 hours. No results for input(s): LIPASE, AMYLASE in the last 168 hours. No results for input(s): AMMONIA in the last 168 hours.  ABG    Component Value Date/Time   PHART 7.378 10/29/2020 1633   PCO2ART 40.8 11/18/2020 1633   PO2ART 100 11/21/2020 1633   HCO3 24.0 11/16/2020 1633   TCO2 25 11/02/2020 1633   ACIDBASEDEF 1.0 10/31/2020 1633   O2SAT 98.0 11/04/2020 1633     Coagulation Profile: No results for input(s): INR, PROTIME in the last 168 hours.  Cardiac Enzymes: No results for input(s): CKTOTAL, CKMB, CKMBINDEX, TROPONINI in the last 168 hours.  HbA1C: Hgb A1c MFr Bld  Date/Time Value Ref Range Status  10/25/2017 07:08 PM 4.8 4.8 - 5.6 % Final    Comment:    (NOTE) Pre diabetes:          5.7%-6.4% Diabetes:              >6.4% Glycemic control for   <7.0% adults with diabetes     CBG: No results for input(s): GLUCAP in the last 168 hours.  Review of Systems:   Unable to obtain review of systems, patient intubated and sedate.  Past Medical History:  He,  has a past medical history of Benign neoplasm of  parathyroid gland (03/31/2013), Calculus of kidney (03/31/2013), Hypertension, Leukocytosis (10/26/2017), Loss of weight (10/26/2017), Multiple leg contusions, unspecified laterality, sequela (10/26/2017), Scalp hematoma, subsequent encounter (10/26/2017), and Stroke ([place]).   Surgical History:   Past Surgical History:  Procedure Laterality Date  . ESOPHAGOGASTRODUODENOSCOPY (EGD) WITH PROPOFOL N/A 11/09/2017   Procedure: ESOPHAGOGASTRODUODENOSCOPY (EGD) WITH PROPOFOL;  Surgeon: Laurence Spates, MD;  Location: [facility];  Service: Endoscopy;  Laterality: N/A;     Social History:   reports that he has never smoked. He has never used smokeless tobacco. He reports current alcohol use of about 6.0 - 12.0 standard drinks of alcohol per week. He reports that he does not use drugs.   Family History:  His family history includes Brain cancer in his mother; Cancer in his sister.   Allergies No Known Allergies   Home Medications  Prior to Admission medications   Medication Sig Start Date End Date Taking? Authorizing  Provider  levofloxacin (LEVAQUIN) 750 MG tablet Take 750 mg by mouth daily. 10/28/20 11/04/20 Yes [provider]  levothyroxine (SYNTHROID, LEVOTHROID) 50 MCG tablet Take 1 tablet (50 mcg total) by mouth daily before breakfast. Patient taking differently: Take 50 mcg by mouth every morning. 11/11/17  Yes Bland, Scott, DO  LORazepam (ATIVAN) 2 MG/ML concentrated solution Take 0.5 mLs (1 mg total) by mouth every 8 (eight) hours as needed for anxiety. Patient taking differently: Take 1 mg by mouth every 8 (eight) hours as needed ("for anxiety, until 23:59 on 11/07/2020"). 10/21/20  Yes Nita Sells, MD  OXYGEN Inhale 2 L/min into the lungs continuous.   Yes [provider]     Critical care time: 45 minutes    Freda Jackson, MD [place] Office: [phone]   See Amion for Pager Details

## 2020-10-30 NOTE — Progress Notes (Signed)
[facility] Progress Note Patient Name: Ian [name] DOB: Dec 03, 1925 MRN: [id number]   Date of Service  11/02/2020  HPI/Events of Note  Patient desaturated to the 80's and respiratory therapy had to increase FIO2 to 100 % and increase PEEP from 5 to 8 cm H20, saturation now 91-92%, RT listened to him and he is clear and symmetrical bilaterally, ETT location is unchanged.  eICU Interventions  PEEP increased to 10 cm of H20, stat portable CXR ordered.        Kerry Kass Kia Stavros 11/04/2020, 9:41 PM

## 2020-10-30 NOTE — Progress Notes (Signed)
[facility] Progress Note Patient Name: Arvine Clayburn DOB: 1926-07-15 MRN: [id number]   Date of Service  11/20/2020  HPI/Events of Note  Bedside RN's in the ED and ICU unable to place an NG tube despite multiple attempts. Patient needs PRN medications for fever and pain.  eICU Interventions  Nursing communication entered instructing RN's to hold further attempts at placing NG tube, imaging guided Dobbhoff tube placement will be ordered for the a.m. PRN Tylenol suppositories ordered.        Kerry Kass Ogan 10/28/2020, 11:42 PM

## 2020-10-30 NOTE — ED Notes (Signed)
Attempted report however floor stated they are getting two pts from [place] and I will have to call report after night shift gets here.

## 2020-10-30 NOTE — Progress Notes (Signed)
[facility] Progress Note Patient Name: Ian [name] DOB: [birth year] MRN: [id number]   Date of Service  11/10/2020  HPI/Events of Note  Patient admitted to the hospital with septic shock, acute hypoxemic respiratory failure s/p intubation, aspiration pneumonia following a choking spell while eating. He is a DNR but wife wants aggressive treatment up to the point of cardiac arrest.  eICU Interventions  New Patient Evaluation completed.        Kerry Kass Ogan 11/11/2020, 8:38 PM

## 2020-10-30 NOTE — ED Provider Notes (Signed)
[facility] EMERGENCY DEPARTMENT Provider Note   CSN: [id number] Arrival date & time: 11-23-20  1514     History Chief Complaint  Patient presents with  . Respiratory Distress    Ian [name] is a 87 y.o. male.  Patient with history of dementia stroke at a nursing home presents with worsening mental status difficulty breathing.  Per EMS report patient thought to have had a choking episode earlier today and had persistent worsening of condition throughout the day.  He became less and less responsive.  EMS was called for difficulty breathing.  EMS was applying bag-valve-mask to the patient upon arrival to the ER.  Unable to obtain any history or review of systems from the patient himself given his history of dementia and ultimately status today.        Past Medical History:  Diagnosis Date  . 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 Calculus of kidney 03/31/2013   Overview:  IMPRESSION: see ER note   . Hypertension   . Leukocytosis 10/26/2017  . Loss of weight 10/26/2017  . Multiple leg contusions, unspecified laterality, sequela 10/26/2017  . Scalp hematoma, subsequent encounter 10/26/2017  . Stroke [facility])     Patient Active Problem List   Diagnosis Date Noted  . Fracture of anterior arch of C1 ([place]) 10/15/2020  . Severe sepsis ([place]) 10/15/2020  . Cellulitis of left lower extremity 10/15/2020  . Acute metabolic encephalopathy 77/93/9030  . Anemia 02/12/2018  . Duodenal ulcer 02/12/2018  . History of stroke 02/12/2018  . ETOH abuse 02/12/2018  . Right foot drop 02/12/2018  . Dyslipidemia 11/16/2017  . GI bleed due to NSAIDs 11/09/2017  . Melena 11/09/2017  . Acute blood loss anemia 11/09/2017  . GI bleed 11/09/2017  . Peptic ulcer disease   . Dysphagia, post-stroke   . Essential hypertension   . Embolic cerebral infarction ([place]) 10/29/2017  . Dementia without behavioral disturbance ([place])   . Benign essential HTN   . Diastolic dysfunction   .  Hypothyroidism   . Hypokalemia   . Altered mental status   . Pressure injury of skin 10/26/2017  . Loss of weight 10/26/2017  . Elevated troponin I level 10/26/2017  . Leukocytosis 10/26/2017  . Multiple leg contusions, unspecified laterality, sequela 10/26/2017  . Cognitive impairment 10/26/2017  . Scalp hematoma, subsequent encounter 10/26/2017  . Head injury 10/26/2017  . Fall at home, sequela 10/26/2017  . Right BBB/left ant fasc block 10/26/2017  . Acute confusional state 10/26/2017  . Dizziness and giddiness 10/26/2017  . Cerebral infarction [facility]), right occipital lobe 10/26/2017  . Elevated AST (SGOT) 10/26/2017  . Shoulder injury, right, subsequent encounter 10/26/2017  . Paranasal sinus disease 10/26/2017  . Maxillary sinusitis 10/26/2017  . Ethmoid sinusitis 10/26/2017  . Sphenoid sinusitis 10/26/2017  . Rhabdomyolysis 10/25/2017  . Neuropathy 03/31/2013    Past Surgical History:  Procedure Laterality Date  . ESOPHAGOGASTRODUODENOSCOPY (EGD) WITH PROPOFOL N/A 11/09/2017   Procedure: ESOPHAGOGASTRODUODENOSCOPY (EGD) WITH PROPOFOL;  Surgeon: Laurence Spates, MD;  Location: [facility];  Service: Endoscopy;  Laterality: N/A;       Family History  Problem Relation Age of Onset  . Brain cancer Mother   . Cancer Sister     Social History   Tobacco Use  . Smoking status: Never Smoker  . Smokeless tobacco: Never Used  Substance Use Topics  . Alcohol use: Yes    Alcohol/week: 6.0 - 12.0 standard drinks    Types: 6 - 12 Cans  of beer per week    Comment: 3-6 alcoholic drinks per day  . Drug use: No    Home Medications Prior to Admission medications   Medication Sig Start Date End Date Taking? Authorizing Provider  levofloxacin (LEVAQUIN) 750 MG tablet Take 750 mg by mouth daily. 10/28/20 11/04/20 Yes [provider]  levothyroxine (SYNTHROID, LEVOTHROID) 50 MCG tablet Take 1 tablet (50 mcg total) by mouth daily before breakfast. Patient taking  differently: Take 50 mcg by mouth every morning. 11/11/17  Yes Bland, Scott, DO  LORazepam (ATIVAN) 2 MG/ML concentrated solution Take 0.5 mLs (1 mg total) by mouth every 8 (eight) hours as needed for anxiety. Patient taking differently: Take 1 mg by mouth every 8 (eight) hours as needed ("for anxiety, until 23:59 on 11/07/2020"). 10/21/20  Yes Nita Sells, MD  OXYGEN Inhale 2 L/min into the lungs continuous.   Yes [provider]    Allergies    Patient has no known allergies.  Review of Systems   Review of Systems  Unable to perform ROS: Dementia    Physical Exam Updated Vital Signs BP 122/74   Pulse (!) 106   Temp 99.2 F (37.3 C)   Resp 18   Ht 5\' 11"  (1.803 m)   SpO2 100%   BMI 24.51 kg/m   Physical Exam Constitutional:      Appearance: He is well-developed. He is ill-appearing.  HENT:     Head: Normocephalic.     Nose: Nose normal.  Eyes:     Extraocular Movements: Extraocular movements intact.  Cardiovascular:     Rate and Rhythm: Normal rate.  Pulmonary:     Comments: Struggling to breathe.  Being bagged by EMS.  Breath sounds equal bilaterally.  Rhonchi present bilaterally. Skin:    Coloration: Skin is not jaundiced.  Neurological:     Comments: Minimal, nonfocal movements.  No localization to pain.     ED Results / Procedures / Treatments   Labs (all labs ordered are listed, but only abnormal results are displayed) Labs Reviewed  CBC WITH DIFFERENTIAL/PLATELET - Abnormal; Notable for the following components:      Result Value   WBC 17.8 (*)    RBC 3.92 (*)    Hemoglobin 12.7 (*)    MCV 106.1 (*)    nRBC 0.3 (*)    All other components within normal limits  URINALYSIS, ROUTINE W REFLEX MICROSCOPIC - Abnormal; Notable for the following components:   APPearance TURBID (*)    Specific Gravity, Urine >1.030 (*)    Bilirubin Urine MODERATE (*)    Protein, ur 30 (*)    Nitrite POSITIVE (*)    Leukocytes,Ua TRACE (*)    All other  components within normal limits  URINALYSIS, MICROSCOPIC (REFLEX) - Abnormal; Notable for the following components:   Bacteria, UA RARE (*)    All other components within normal limits  I-STAT ARTERIAL BLOOD GAS, ED - Abnormal; Notable for the following components:   Sodium 153 (*)    Calcium, Ion 1.64 (*)    HCT 33.0 (*)    Hemoglobin 11.2 (*)    All other components within normal limits  CULTURE, BLOOD (ROUTINE X 2)  CULTURE, BLOOD (ROUTINE X 2)  URINE CULTURE  RESP PANEL BY RT-PCR (FLU A&B, COVID) ARPGX2  COMPREHENSIVE METABOLIC PANEL  LACTIC ACID, PLASMA  LACTIC ACID, PLASMA  BLOOD GAS, ARTERIAL  TROPONIN I (HIGH SENSITIVITY)  TROPONIN I (HIGH SENSITIVITY)    EKG None  Radiology DG  Chest 1 View  Result Date: 11/15/2020 CLINICAL DATA:  Intubated EXAM: CHEST  1 VIEW COMPARISON:  10/19/2020 FINDINGS: Single frontal view of the chest demonstrates endotracheal tube overlying tracheal air column tip midway between thoracic inlet and carina. The cardiac silhouette is stable. Bilateral perihilar airspace disease with bilateral pleural effusions again noted, less prominent than previous. No pneumothorax. No acute bony abnormalities. IMPRESSION: 1. Endotracheal tube well-positioned as above. 2. Bilateral airspace disease and effusions, compatible with edema. Electronically Signed   By: Randa Ngo M.D.   On: 11/22/2020 15:47    Procedures .Critical Care Performed by: Luna Fuse, MD Authorized by: Luna Fuse, MD   Critical care provider statement:    Critical care time (minutes):  45   Critical care time was exclusive of:  Separately billable procedures and treating other patients and teaching time   Critical care was time spent personally by me on the following activities:  Discussions with consultants, evaluation of patient's response to treatment, examination of patient, ordering and performing treatments and interventions, ordering and review of laboratory studies,  ordering and review of radiographic studies, pulse oximetry, re-evaluation of patient's condition, obtaining history from patient or surrogate and review of old charts Comments:          Date/Time: 10/29/2020 5:05 PM Performed by: Luna Fuse, MD Comments: Using bag valve mask, unable to preoxygenated the patient above 86%.  Glide scope used to intubate the patient.  Patient given rocuronium 100 mg as well as etomidate 20 mg.  Upon visualization of the posterior pharynx there was copious amounts of mucus and some food particles which were suctioned out.  8.0 ET tube advanced to 24 at the teeth presents equal bilaterally good color change on end-tidal color monitor.  No worsening desaturations noted.        Medications Ordered in ED Medications  norepinephrine (LEVOPHED) 4mg  in 287mL premix infusion (10 mcg/min Intravenous Rate/Dose Change 11/14/2020 1630)  fentaNYL 2557mcg in NS 240mL (57mcg/ml) infusion-PREMIX (25 mcg/hr Intravenous New Bag/Given 10/28/2020 1605)  piperacillin-tazobactam (ZOSYN) IVPB 3.375 g (has no administration in time range)  0.9 %  sodium chloride infusion (1,000 mLs Intravenous New Bag/Given 11/16/2020 1524)  EPINEPhrine 10 mcg/mL Adult IV Push Syringe (For Blood Pressure Support) (20 mcg Intravenous Given 11/13/2020 1521)  etomidate (AMIDATE) injection (20 mg Intravenous Given 11/16/2020 1524)  rocuronium (ZEMURON) injection (100 mg Intravenous Given 11/05/2020 1524)  norepinephrine (LEVOPHED) 4mg  in 251mL premix infusion ( Intravenous Stopped 11/22/2020 1602)    ED Course  I have reviewed the triage vital signs and the nursing notes.  Pertinent labs & imaging results that were available during my care of the patient were reviewed by me and considered in my medical decision making (see chart for details).    MDM Rules/Calculators/A&P                          Patient does come with a DNR form.  However unclear whether or not intubation is something he would want.  Patient's  condition discussed with his wife who was at bedside.  She agrees to pursue intubation at this time.  Concern for aspiration and sepsis.  Patient started on broad-spectrum antibiotics Levophed started.  ICU consulted.   Final Clinical Impression(s) / ED Diagnoses Final diagnoses:  Sepsis, due to unspecified organism, unspecified whether acute organ dysfunction present ([place])  Aspiration pneumonia, unspecified aspiration pneumonia type, unspecified laterality, unspecified part of lung ([place])  Rx / DC Orders ED Discharge Orders    None       Luna Fuse, MD 11/15/2020 [phone]

## 2020-10-30 NOTE — ED Triage Notes (Signed)
Per ems pt from [place] [facility] called out for resp distress. Pt being treated there for pna. Was eating today aspirated and has slowly declined since then. Pt  On non rebreather at 86%. Found in bed unresponsive. Ems has 18g L arm.

## 2020-10-30 NOTE — Progress Notes (Signed)
Pt transported from ED to 77M 10 without event. Report given to 77M therapist.

## 2020-10-30 NOTE — ED Notes (Signed)
Attempt OG and NG multiple times not successfull. MD in ED aware and admitting MD. Faythe Ghee to let icu try when pt gets to floor.

## 2020-10-30 NOTE — Progress Notes (Signed)
Pharmacy Antibiotic Note  Ian [name] is a 87 y.o. male admitted on 11/16/2020 with sepsis.  Pharmacy has been consulted for Zosyn and Vancomycin dosing.   Height: 5\' 11"  (180.3 cm) IBW/kg (Calculated) : 75.3  Temp (24hrs), Avg:99.3 F (37.4 C), Min:99 F (37.2 C), Max:99.6 F (37.6 C)  Recent Labs  Lab 11/20/2020 1635  WBC 17.8*  CREATININE 1.87*    Estimated Creatinine Clearance: 26.8 mL/min (A) (by C-G formula based on SCr of 1.87 mg/dL (H)).    No Known Allergies  Antimicrobials this admission: 3/7 Zosyn >>  3/7 Vancomycin >>   Dose adjustments this admission: N/a  Microbiology results: Pending   Plan:  - Zosyn 3.375g IV x 1 dose over 30 min followed by Zosyn 3.375g IV every 8 hours infused over 4 hours  - Vancomycin 150mg IV x 1 dose  - Will dose based on random levels due to AKI (baseline Scr ~ 9 days ago was 0.9)  - Monitor patients renal function and urine output  - De-escalate ABX when appropriate   Thank you for allowing pharmacy to be a part of this patient's care.  Duanne Limerick PharmD. BCPS 11/08/2020 6:30 PM

## 2020-10-30 NOTE — ED Notes (Signed)
MD notified of both lactic readings. First lactic 1525 3.2. second troponin 1731 2.7 per lab.

## 2020-10-31 ENCOUNTER — Inpatient Hospital Stay: Payer: Self-pay

## 2020-10-31 DIAGNOSIS — J9601 Acute respiratory failure with hypoxia: Secondary | ICD-10-CM | POA: Diagnosis not present

## 2020-10-31 LAB — CBC
HCT: 35.4 % — ABNORMAL LOW (ref 39.0–52.0)
Hemoglobin: 11.6 g/dL — ABNORMAL LOW (ref 13.0–17.0)
MCH: 33.5 pg (ref 26.0–34.0)
MCHC: 32.8 g/dL (ref 30.0–36.0)
MCV: 102.3 fL — ABNORMAL HIGH (ref 80.0–100.0)
Platelets: 234 10*3/uL (ref 150–400)
RBC: 3.46 MIL/uL — ABNORMAL LOW (ref 4.22–5.81)
RDW: 15.2 % (ref 11.5–15.5)
WBC: 20.2 10*3/uL — ABNORMAL HIGH (ref 4.0–10.5)
nRBC: 0.5 % — ABNORMAL HIGH (ref 0.0–0.2)

## 2020-10-31 LAB — MAGNESIUM: Magnesium: 1.4 mg/dL — ABNORMAL LOW (ref 1.7–2.4)

## 2020-10-31 LAB — GLUCOSE, CAPILLARY
Glucose-Capillary: 105 mg/dL — ABNORMAL HIGH (ref 70–99)
Glucose-Capillary: 107 mg/dL — ABNORMAL HIGH (ref 70–99)
Glucose-Capillary: 63 mg/dL — ABNORMAL LOW (ref 70–99)
Glucose-Capillary: 85 mg/dL (ref 70–99)
Glucose-Capillary: 87 mg/dL (ref 70–99)
Glucose-Capillary: 95 mg/dL (ref 70–99)
Glucose-Capillary: 96 mg/dL (ref 70–99)

## 2020-10-31 LAB — TROPONIN I (HIGH SENSITIVITY)
Troponin I (High Sensitivity): 111 ng/L (ref <18)
Troponin I (High Sensitivity): 190 ng/L (ref <18)

## 2020-10-31 LAB — BLOOD GAS, VENOUS
Acid-base deficit: 4.4 mmol/L — ABNORMAL HIGH (ref 0.0–2.0)
Bicarbonate: 21 mmol/L (ref 20.0–28.0)
O2 Saturation: 81.5 %
Patient temperature: 37
pCO2, Ven: 44.5 mmHg (ref 44.0–60.0)
pH, Ven: 7.296 (ref 7.250–7.430)

## 2020-10-31 LAB — BASIC METABOLIC PANEL
Anion gap: 12 (ref 5–15)
BUN: 47 mg/dL — ABNORMAL HIGH (ref 8–23)
CO2: 17 mmol/L — ABNORMAL LOW (ref 22–32)
Calcium: 10.8 mg/dL — ABNORMAL HIGH (ref 8.9–10.3)
Chloride: 118 mmol/L — ABNORMAL HIGH (ref 98–111)
Creatinine, Ser: 2.01 mg/dL — ABNORMAL HIGH (ref 0.61–1.24)
GFR, Estimated: 31 mL/min — ABNORMAL LOW (ref >60)
Glucose, Bld: 126 mg/dL — ABNORMAL HIGH (ref 70–99)
Potassium: 3.3 mmol/L — ABNORMAL LOW (ref 3.5–5.1)
Sodium: 147 mmol/L — ABNORMAL HIGH (ref 135–145)

## 2020-10-31 LAB — PHOSPHORUS: Phosphorus: 2.8 mg/dL (ref 2.5–4.6)

## 2020-10-31 LAB — PROCALCITONIN: Procalcitonin: 5.32 ng/mL

## 2020-10-31 MED ORDER — AMIODARONE LOAD VIA INFUSION
150.0000 mg | Freq: Once | INTRAVENOUS | Status: AC
  Administered 2020-10-31: 150 mg via INTRAVENOUS
  Filled 2020-10-31: qty 83.34

## 2020-10-31 MED ORDER — NOREPINEPHRINE 4 MG/250ML-% IV SOLN
0.0000 ug/min | INTRAVENOUS | Status: DC
  Administered 2020-10-31: 22 ug/min via INTRAVENOUS
  Filled 2020-10-31: qty 250

## 2020-10-31 MED ORDER — NOREPINEPHRINE 16 MG/250ML-% IV SOLN
0.0000 ug/min | INTRAVENOUS | Status: DC
  Administered 2020-10-31: 24 ug/min via INTRAVENOUS
  Administered 2020-11-01: 20 ug/min via INTRAVENOUS
  Administered 2020-11-01: 26 ug/min via INTRAVENOUS
  Administered 2020-11-02: 40 ug/min via INTRAVENOUS
  Administered 2020-11-02: 22 ug/min via INTRAVENOUS
  Administered 2020-11-03 (×2): 40 ug/min via INTRAVENOUS
  Filled 2020-10-31 (×8): qty 250

## 2020-10-31 MED ORDER — POTASSIUM CHLORIDE 10 MEQ/100ML IV SOLN
10.0000 meq | INTRAVENOUS | Status: AC
  Administered 2020-10-31 (×4): 10 meq via INTRAVENOUS
  Filled 2020-10-31 (×4): qty 100

## 2020-10-31 MED ORDER — DEXTROSE 50 % IV SOLN
INTRAVENOUS | Status: AC
  Administered 2020-10-31: 25 mL
  Filled 2020-10-31: qty 50

## 2020-10-31 MED ORDER — SODIUM CHLORIDE 0.9% FLUSH
10.0000 mL | Freq: Two times a day (BID) | INTRAVENOUS | Status: DC
  Administered 2020-10-31 – 2020-11-02 (×6): 10 mL

## 2020-10-31 MED ORDER — POTASSIUM CHLORIDE 10 MEQ/100ML IV SOLN: 10.0000 meq | INTRAVENOUS | Status: DC

## 2020-10-31 MED ORDER — AMIODARONE HCL IN DEXTROSE 360-4.14 MG/200ML-% IV SOLN
60.0000 mg/h | INTRAVENOUS | Status: AC
  Administered 2020-10-31 (×2): 60 mg/h via INTRAVENOUS
  Filled 2020-10-31 (×2): qty 200

## 2020-10-31 MED ORDER — SODIUM CHLORIDE 0.9% FLUSH: 10.0000 mL | INTRAVENOUS | Status: DC | PRN

## 2020-10-31 MED ORDER — AMIODARONE HCL IN DEXTROSE 360-4.14 MG/200ML-% IV SOLN
30.0000 mg/h | INTRAVENOUS | Status: DC
  Administered 2020-11-01 – 2020-11-03 (×6): 30 mg/h via INTRAVENOUS
  Filled 2020-10-31 (×5): qty 200

## 2020-10-31 MED ORDER — MAGNESIUM SULFATE 4 GM/100ML IV SOLN
4.0000 g | Freq: Once | INTRAVENOUS | Status: AC
  Administered 2020-10-31: 4 g via INTRAVENOUS
  Filled 2020-10-31: qty 100

## 2020-10-31 MED ORDER — PIPERACILLIN-TAZOBACTAM 3.375 G IVPB
3.3750 g | Freq: Three times a day (TID) | INTRAVENOUS | Status: DC
  Administered 2020-10-31 – 2020-11-03 (×9): 3.375 g via INTRAVENOUS
  Filled 2020-10-31 (×10): qty 50

## 2020-10-31 NOTE — Plan of Care (Signed)
PCCM Interval Note  I spoke with the patient's wife [name] at bedside to review current status, prognosis.  Explained his respiratory failure, lung injury, septic shock, atrial fibrillation and renal failure as well as our current interventions.  I confirmed with her that we will continue current interventions for at least another 1 to 2 days to see if we can achieve improvement.  I have recommended that if he does not show quick signs of recovery that given his age and prognosis we may want to consider transitioning off of invasive critical care interventions.  I also explained to her that he is a poor candidate for hemodialysis should his renal function continued to decline.  She understands.  I recommended against HD or nephrology consultation and she agrees.  I confirmed his DNR status.  She wants all medical care for an appropriate period to see if he shows any signs of improvement as described above.  Independent CC time 15 minutes.    Baltazar Apo, MD, PhD 10/31/2020, 2:54 PM [facility] [phone] or if no answer before 7:00PM call [phone] For any issues after 7:00PM please call eLink [phone]

## 2020-10-31 NOTE — Progress Notes (Signed)
[place] [facility] Progress Note Patient Name: Ian [name] DOB: 1925-12-19 MRN: [id number]   Date of Service  10/31/2020  HPI/Events of Note  K+ 3.3, Mg++ 1.4  eICU Interventions  MgSo4 4 gm iv x 1, KCL 10 meq iv Q 1 hour x 4.        Kerry Kass Vaunda Gutterman 10/31/2020, 2:50 AM

## 2020-10-31 NOTE — Progress Notes (Signed)
Peripherally Inserted Central Catheter Placement  The IV Nurse has discussed with the patient and/or persons authorized to consent for the patient, the purpose of this procedure and the potential benefits and risks involved with this procedure.  The benefits include less needle sticks, lab draws from the catheter, and the patient may be discharged home with the catheter. Risks include, but not limited to, infection, bleeding, blood clot (thrombus formation), and puncture of an artery; nerve damage and irregular heartbeat and possibility to perform a PICC exchange if needed/ordered by physician.  Alternatives to this procedure were also discussed.  Bard Power PICC patient education guide, fact sheet on infection prevention and patient information card has been provided to patient /or left at bedside.    PICC Placement Documentation  PICC Triple Lumen 58/30/94 Right Basilic 39 cm 0 cm (Active)  Indication for Insertion or Continuance of Line Vasoactive infusions 10/31/20 0914  Exposed Catheter (cm) 0 cm 10/31/20 0914  Site Assessment Clean;Dry;Intact 10/31/20 0914  Lumen #1 Status Flushed;Saline locked;Blood return noted 10/31/20 0914  Lumen #2 Status Flushed;Saline locked;Blood return noted 10/31/20 0914  Lumen #3 Status Flushed;Saline locked;Blood return noted 10/31/20 0914  Dressing Type Transparent;Securing device 10/31/20 0914  Dressing Status Clean;Dry;Intact 10/31/20 0914  Antimicrobial disc in place? Yes 10/31/20 0914  Dressing Intervention New dressing 10/31/20 0914  Dressing Change Due 11/07/20 10/31/20 0914       Enos Fling 10/31/2020, 9:16 AM

## 2020-10-31 NOTE — Progress Notes (Signed)
[facility] Progress Note Patient Name: Vernal Hritz DOB: 03/30/1926 MRN: [id number]   Date of Service  10/31/2020  HPI/Events of Note  RT unable to obtain an ABG despite multiple attempts.  eICU Interventions  VBG ordered.        Kerry Kass Deandrew Hoecker 10/31/2020, 3:47 AM

## 2020-10-31 NOTE — Progress Notes (Signed)
Hypoglycemic Event  CBG: 63  Treatment: D50 25 mL (12.5 gm)  Symptoms: None  Follow-up CBG: Time: 2345 CBG Result: 96  Possible Reasons for Event: Inadequate meal intake     Berna Spare

## 2020-10-31 NOTE — Progress Notes (Addendum)
Initial Nutrition Assessment  DOCUMENTATION CODES:   Non-severe (moderate) malnutrition in context of chronic illness  INTERVENTION:   Once Cortrak placed, recommend initiate tube feed:  Vital AF 1.2 at 70 ml/h (1680 ml per day) to provides 2016 kcal, 126 gm protein, 1361 ml free water daily  -MVI with minerals via tube daily   -Provide Juven 1 packet via tube BID. Each packet provides 95 kcal, 7 grams L-Arginine, 7 grams L-Glutamine, 2.5 grams collagen protein, 300 mg vitamin C, 9.5 mg zinc, and other micronutrients essential for wound healing  NUTRITION DIAGNOSIS:   Moderate Malnutrition related to chronic illness (dysphagia from stroke and dementia) as evidenced by moderate muscle depletion,moderate fat depletion,mild fat depletion,mild muscle depletion.  GOAL:   Patient will meet greater than or equal to 90% of their needs  MONITOR:   TF tolerance,Skin,I & O's,Labs,Weight trends,Vent status  REASON FOR ASSESSMENT:   Ventilator    ASSESSMENT:   [name] presented from [place] after choking episode while eating, admitted for respiratory failure with septic shock secondary to aspiration pne. Recent admission after a fall and pne, discharged 3/01 to [facility]. PMH of benign neoplasm of parathyroid gland, leukocytosis, scalp hematoma, HTN, stroke, ETOH abuse, dementia, dysphagia.   Patient is currently intubated on ventilator support MV: 17.9 L/min Temp (24hrs), Avg:99 F (37.2 C), Min:98.06 F (36.7 C), Max:99.86 F (37.7 C) MAP: range from 50-76 since this morning Propofol: none  During previous admission, pt was deemed unsafe to eat by SLP due to severe aspiration risk and dysphagia. Pt is currently not receiving any tube feeds due to inability to place OG tube. Cortrak tube placement planned for 3/9, tube feed recommendations noted above.   Pt's weights reviewed and note a 11% weight loss since 2/28.  Admit weight: 161.38#   Meds reviewed: Colace (BID),  Miralax (daily), Levophed Labs reviewed: Sodium (147, high), Potassium (3.3, low), Corrected calcium (12.56), CBG (95-105)  UOP: 30 mL x 24 hours I&O's: +3,194.8 L since admit   NUTRITION - FOCUSED PHYSICAL EXAM:  Flowsheet Row Most Recent Value  Orbital Region Mild depletion  Upper Arm Region Moderate depletion  Thoracic and Lumbar Region Moderate depletion  Buccal Region Unable to assess  Temple Region Severe depletion  Clavicle Bone Region Moderate depletion  Clavicle and Acromion Bone Region Moderate depletion  Scapular Bone Region Unable to assess  Dorsal Hand Unable to assess  [difficult to assess due to generalized edema]  Patellar Region Mild depletion  Anterior Thigh Region Moderate depletion  Posterior Calf Region Moderate depletion  Edema (RD Assessment) Mild  [generalized, bilateral lower and upper extremities]  Hair Reviewed  Eyes Unable to assess  Mouth Unable to assess  Skin Reviewed  Nails Reviewed      Diet Order:   Diet Order            Diet NPO time specified  Diet effective now                 EDUCATION NEEDS:   No education needs have been identified at this time  Skin:  Skin Assessment: Skin Integrity Issues: Skin Integrity Issues:: Incisions,DTI DTI: Sacrum Incisions: Skin tear Pretibial L erythematous,skin tear medial vertebral column, skin tear medial buttocks  Last BM:  Unknown.  Height:   Ht Readings from Last 1 Encounters:  10/31/2020 5\' 11"  (1.803 m)    Weight:   Wt Readings from Last 1 Encounters:  10/31/20 73.2 kg    Ideal Body Weight:  78.2 kg  BMI:  Body mass index is 22.51 kg/m.  Estimated Nutritional Needs:   Kcal:  1992  Protein:  110-125 g  Fluid:  >/=1.9 L    [name], Dietetic Intern 10/31/2020 2:31 PM

## 2020-10-31 NOTE — Progress Notes (Addendum)
NAME:  Ian [name], MRN:  [id number], DOB:  16-Dec-1925, LOS: 1 ADMISSION DATE:  11/15/2020, CONSULTATION DATE:  10/31/20 REFERRING MD:  EDP, CHIEF COMPLAINT:  Respiratory distress   Brief History:  87 y.o. M who presented from [place] after a choking episode while eating and progressive altered mental status.  He was intubated and covered with broad spectrum antibiotics and PCCM consulted for admission  History of Present Illness:  87 y.o. M with PMH of HTN, stroke and recent admission after a fall and PNA, discharged March 1st to [place].   On 3/7 he was noted to have a choking episode while eating and had progressively worsening mental status and oxygenation and so was brought to the ED and required bagging en route.   He was intubated in the ED, CXR with bilateral airspace disease and effusions.  Labs significant for WBC 17.8, lactic acid and metabolic panel still pending.  His wife was present and confirmed DNR, but did want intubation.  He was started on Zosyn and PCCM consulted for admission   Past Medical History:   has a past medical history of Benign neoplasm of parathyroid gland (03/31/2013), Calculus of kidney (03/31/2013), Hypertension, Leukocytosis (10/26/2017), Loss of weight (10/26/2017), Multiple leg contusions, unspecified laterality, sequela (10/26/2017), Scalp hematoma, subsequent encounter (10/26/2017), and Stroke ([place]).   Significant Hospital Events:  3/7 Admit to PCCM  Consults:    Procedures:  ETT 3/7  Significant Diagnostic Tests:  3/7 CXR>>Bilateral airspace disease and effusions, compatible with edema.  Micro Data:  3/7 Covid, Flu>>pending 3/7 BCx2>> 3/7 urine culture>>  Antimicrobials:  Zosyn 3/7 >>  Vancomycin 3/7 >> 3/8  Interim History / Subjective:  Unable to place OG tube overnight Unable to obtain ABG this morning Magnesium and potassium replaced this morning 3/8 I/O+ 2.5 L total, urine output 30 cc last 24 hours Note metabolic acidosis,  renal dysfunction, leukocytosis  Norepinephrine 10 Fentanyl 25 1.00, PEEP 8   Objective   Blood pressure (!) 91/58, pulse 79, temperature 98.06 F (36.7 C), resp. rate (!) 23, height 5\' 11"  (1.803 m), weight 73.2 kg, SpO2 100 %.    Vent Mode: PRVC FiO2 (%):  [70 %-100 %] 100 % Set Rate:  [18 bmp] 18 bmp Vt Set:  [600 mL] 600 mL PEEP:  [5 cmH20-8 cmH20] 8 cmH20 Pressure Support:  [5 cmH20] 5 cmH20 Plateau Pressure:  [16 cmH20-19 cmH20] 19 cmH20   Intake/Output Summary (Last 24 hours) at 10/31/2020 5400 Last data filed at 10/31/2020 0700 Gross per 24 hour  Intake 2614.2 ml  Output 30 ml  Net 2584.2 ml   Filed Weights   10/31/20 0015 10/31/20 0500  Weight: 73.2 kg 73.2 kg    General: Ill-appearing elderly man, ventilated HEENT: ET tube in place, oropharynx otherwise clear, small pupils, sluggish Neuro: Grimace with stimulation, withdraws lower extremities from pain, does not wake, interact, follow commands CV: Regular, distant, no murmur PULM: Inspiratory rhonchi, no wheezing, comfortable respiratory pattern GI: Nondistended, hypoactive bowel sounds Extremities: Bilateral upper extremities with ecchymoses, mild edema, no lower extremity edema Skin: No breakdown, no rash  Resolved Hospital Problem list     Assessment & Plan:   Acute Hypoxemic Respiratory Failure in setting of known aspiration risk, suspected aspiration pneumonia - Intubated in the ED, wife confirmed DNR but wanted intubation P:  -PRVC 8 cc/kg, FiO2 currently 1.00.  Will up titrate PEEP as he can tolerate, attempt to wean may need to consider a transition to 6 cc/kg depending  on evolution of his pulmonary infiltrates -Empiric Zosyn to cover HCAP, stop vanco 3/8 given negative MRSA PCR screen -Follow chest x-ray and culture data, tailor as indicated -Sedation via PAD with Precedex, fentanyl -VAP prevention ordersl.  Septic Shock secondary to aspiration pneumonia vs UTI -Broad-spectrum antibiotics pending  culture data -Wean norepinephrine as able -Plan for PICC placement 3/8, may allow [place] to follow CVP to guide volume administration -Follow lactic acid for clearance -Follow culture data and tailor antibiotics appropriately  Acute Kidney Injury, oliguric/anuric Hypernatremia, improved Hypokalemia, hypomagnesemia -Okay to continue D5W 100 cc/h for free water, hemodynamic support.  Add enteral free water when access obtained -Following urine output, BMP.  Currently oliguric and at high risk for overt failure.  He is a poor candidate for hemodialysis.  Will need to discuss with family. no urgent need for dialysis at this point, will defer nephrology consultation for now  Stress NSTEMI, due to sepsis, demand -Received aspirin 325 mg -Follow EKG, troponin trend  Hypothyroidism -Synthroid ordered per tube, will need to change to IV if unable get enteral access  Best practice (evaluated daily)  Diet: NPO Pain/Anxiety/Delirium protocol (if indicated): Fentanyl, precedex VAP protocol (if indicated): HOB 30 degrees, suction prn DVT prophylaxis: heparin GI prophylaxis: PPI Glucose control: SSI Mobility: bed rest Disposition: ICU  Goals of Care:  Last date of multidisciplinary goals of care discussion: Family and staff present:  Summary of discussion:  Follow up goals of care discussion due: 3/14 Code Status: DNR Family: will review status with patient's wife 3/8   Labs   CBC: Recent Labs  Lab 11/14/2020 1633 11/04/2020 1635 11/05/2020 2057 10/31/20 0121  WBC  --  17.8* 16.6* 20.2*  NEUTROABS  --  16.6*  --   --   HGB 11.2* 12.7* 14.0 11.6*  HCT 33.0* 41.6 42.8 35.4*  MCV  --  106.1* 102.4* 102.3*  PLT  --  267 273 694    Basic Metabolic Panel: Recent Labs  Lab 11/04/2020 1633 11/14/2020 1635 10/25/2020 2057 10/31/20 0121  NA 153* 152* 150* 147*  K 3.5 4.0 3.4* 3.3*  CL  --  120* 116* 118*  CO2  --  20* 20* 17*  GLUCOSE  --  75 109* 126*  BUN  --  42* 42* 47*  CREATININE  --   1.87* 1.99* 2.01*  CALCIUM  --  11.4* 11.4* 10.8*  MG  --   --   --  1.4*  PHOS  --   --   --  2.8   GFR: Estimated Creatinine Clearance: 24.3 mL/min (A) (by C-G formula based on SCr of 2.01 mg/dL (H)). Recent Labs  Lab 10/29/2020 1625 10/24/2020 1635 11/20/2020 1731 11/02/2020 2057 10/31/20 0121  PROCALCITON  --   --   --  3.67 5.32  WBC  --  17.8*  --  16.6* 20.2*  LATICACIDVEN 3.2*  --  2.7*  --   --     Liver Function Tests: Recent Labs  Lab 11/06/2020 1635 11/09/2020 2057  AST 42* 55*  ALT 22 25  ALKPHOS 89 86  BILITOT 2.1* 2.0*  PROT 5.1* 5.4*  ALBUMIN 1.7* 1.8*   No results for input(s): LIPASE, AMYLASE in the last 168 hours. No results for input(s): AMMONIA in the last 168 hours.  ABG    Component Value Date/Time   PHART 7.378 10/28/2020 1633   PCO2ART 40.8 10/26/2020 1633   PO2ART 100 11/18/2020 1633   HCO3 21.0 10/31/2020 0418   TCO2 25  11/05/2020 1633   ACIDBASEDEF 4.4 (H) 10/31/2020 0418   O2SAT 81.5 10/31/2020 0418     Coagulation Profile: No results for input(s): INR, PROTIME in the last 168 hours.  Cardiac Enzymes: No results for input(s): CKTOTAL, CKMB, CKMBINDEX, TROPONINI in the last 168 hours.  HbA1C: Hgb A1c MFr Bld  Date/Time Value Ref Range Status  11/05/2020 04:35 PM 5.2 4.8 - 5.6 % Final    Comment:    (NOTE) Pre diabetes:          5.7%-6.4%  Diabetes:              >6.4%  Glycemic control for   <7.0% adults with diabetes   10/25/2017 07:08 PM 4.8 4.8 - 5.6 % Final    Comment:    (NOTE) Pre diabetes:          5.7%-6.4% Diabetes:              >6.4% Glycemic control for   <7.0% adults with diabetes     CBG: Recent Labs  Lab 11/10/2020 1855 11/05/2020 2037 11/21/2020 2340 10/31/20 0343 10/31/20 0744  GLUCAP 86 84 98 95 105*    Critical care time: 35 minutes     Baltazar Apo, MD, PhD 10/31/2020, 8:14 AM [facility] [phone] or if no answer before 7:00PM call [phone] For any issues after  7:00PM please call eLink [phone]

## 2020-11-01 ENCOUNTER — Inpatient Hospital Stay (HOSPITAL_COMMUNITY): Payer: Medicare Other

## 2020-11-01 ENCOUNTER — Other Ambulatory Visit: Payer: Self-pay

## 2020-11-01 DIAGNOSIS — N170 Acute kidney failure with tubular necrosis: Secondary | ICD-10-CM | POA: Diagnosis not present

## 2020-11-01 DIAGNOSIS — J690 Pneumonitis due to inhalation of food and vomit: Secondary | ICD-10-CM | POA: Diagnosis not present

## 2020-11-01 DIAGNOSIS — J9601 Acute respiratory failure with hypoxia: Secondary | ICD-10-CM | POA: Diagnosis not present

## 2020-11-01 LAB — COMPREHENSIVE METABOLIC PANEL
ALT: 27 U/L (ref 0–44)
AST: 40 U/L (ref 15–41)
Albumin: 1.2 g/dL — ABNORMAL LOW (ref 3.5–5.0)
Alkaline Phosphatase: 82 U/L (ref 38–126)
Anion gap: 12 (ref 5–15)
BUN: 48 mg/dL — ABNORMAL HIGH (ref 8–23)
CO2: 17 mmol/L — ABNORMAL LOW (ref 22–32)
Calcium: 10.2 mg/dL (ref 8.9–10.3)
Chloride: 108 mmol/L (ref 98–111)
Creatinine, Ser: 2.05 mg/dL — ABNORMAL HIGH (ref 0.61–1.24)
GFR, Estimated: 30 mL/min — ABNORMAL LOW (ref >60)
Glucose, Bld: 119 mg/dL — ABNORMAL HIGH (ref 70–99)
Potassium: 3.3 mmol/L — ABNORMAL LOW (ref 3.5–5.1)
Sodium: 137 mmol/L (ref 135–145)
Total Bilirubin: 1.3 mg/dL — ABNORMAL HIGH (ref 0.3–1.2)
Total Protein: 4.5 g/dL — ABNORMAL LOW (ref 6.5–8.1)

## 2020-11-01 LAB — CBC
HCT: 34.3 % — ABNORMAL LOW (ref 39.0–52.0)
Hemoglobin: 11.1 g/dL — ABNORMAL LOW (ref 13.0–17.0)
MCH: 33.2 pg (ref 26.0–34.0)
MCHC: 32.4 g/dL (ref 30.0–36.0)
MCV: 102.7 fL — ABNORMAL HIGH (ref 80.0–100.0)
Platelets: 186 10*3/uL (ref 150–400)
RBC: 3.34 MIL/uL — ABNORMAL LOW (ref 4.22–5.81)
RDW: 14.4 % (ref 11.5–15.5)
WBC: 29.3 10*3/uL — ABNORMAL HIGH (ref 4.0–10.5)
nRBC: 1 % — ABNORMAL HIGH (ref 0.0–0.2)

## 2020-11-01 LAB — URINE CULTURE: Culture: NO GROWTH

## 2020-11-01 LAB — GLUCOSE, CAPILLARY
Glucose-Capillary: 85 mg/dL (ref 70–99)
Glucose-Capillary: 115 mg/dL — ABNORMAL HIGH (ref 70–99)
Glucose-Capillary: 92 mg/dL (ref 70–99)
Glucose-Capillary: 82 mg/dL (ref 70–99)
Glucose-Capillary: 92 mg/dL (ref 70–99)
Glucose-Capillary: 99 mg/dL (ref 70–99)

## 2020-11-01 LAB — MAGNESIUM: Magnesium: 1.8 mg/dL (ref 1.7–2.4)

## 2020-11-01 LAB — PROCALCITONIN: Procalcitonin: 6.18 ng/mL

## 2020-11-01 LAB — PHOSPHORUS: Phosphorus: 3.3 mg/dL (ref 2.5–4.6)

## 2020-11-01 MED ORDER — PROSOURCE TF PO LIQD
45.0000 mL | Freq: Two times a day (BID) | ORAL | Status: DC
  Administered 2020-11-01 – 2020-11-02 (×3): 45 mL
  Filled 2020-11-01 (×3): qty 45

## 2020-11-01 MED ORDER — POTASSIUM CHLORIDE 10 MEQ/50ML IV SOLN
10.0000 meq | INTRAVENOUS | Status: AC
  Administered 2020-11-01 (×4): 10 meq via INTRAVENOUS
  Filled 2020-11-01 (×4): qty 50

## 2020-11-01 MED ORDER — VITAL AF 1.2 CAL PO LIQD
1000.0000 mL | ORAL | Status: DC
  Administered 2020-11-01 – 2020-11-02 (×2): 1000 mL
  Filled 2020-11-01: qty 1000

## 2020-11-01 MED ORDER — ACETAMINOPHEN 160 MG/5ML PO SOLN
650.0000 mg | Freq: Four times a day (QID) | ORAL | Status: DC | PRN
  Administered 2020-11-01 – 2020-11-03 (×2): 650 mg
  Filled 2020-11-01 (×2): qty 20.3

## 2020-11-01 MED ORDER — COLLAGENASE 250 UNIT/GM EX OINT
TOPICAL_OINTMENT | Freq: Every day | CUTANEOUS | Status: DC
  Administered 2020-11-02: 1 via TOPICAL
  Filled 2020-11-01: qty 30

## 2020-11-01 MED ORDER — LACTATED RINGERS IV BOLUS
1000.0000 mL | Freq: Once | INTRAVENOUS | Status: AC
  Administered 2020-11-01: 1000 mL via INTRAVENOUS

## 2020-11-01 MED ORDER — PANTOPRAZOLE SODIUM 40 MG PO PACK
40.0000 mg | PACK | Freq: Every day | ORAL | Status: DC
  Administered 2020-11-01 – 2020-11-02 (×2): 40 mg
  Filled 2020-11-01 (×2): qty 20

## 2020-11-01 NOTE — Procedures (Signed)
Cortrak  Person Inserting Tube:  Ian [name], Ian [name], Ian [name] Tube Type:  Cortrak - 43 inches Tube Location:  Right nare Initial Placement:  Stomach Secured by: Bridle Technique Used to Measure Tube Placement:  Documented cm marking at nare/ corner of mouth Cortrak Secured At:  77 cm    No x-ray is required. RN may begin using tube.   If the tube becomes dislodged please keep the tube and contact the Cortrak team at [email or web address] (password [email or web address]) for replacement.  If after hours and replacement cannot be delayed, place a NG tube and confirm placement with an abdominal x-ray.    Ian [name] Ian [name], LDN Clinical Nutrition Pager listed in [place]

## 2020-11-01 NOTE — Progress Notes (Signed)
[facility] Progress Note Patient Name: Ian [name] DOB: May 17, 1926 MRN: [id number]   Date of Service  11/01/2020  HPI/Events of Note  Request to change Tylenol PR to Tylenol per tube. AST and ALT both normal.   eICU Interventions  Plan: 1. D/C Tylenol PR. 2. Tylenol liquid 650 mg per tube Q 6 hours PRN Temp > 101.0 F.     Intervention Category Major Interventions: Other:  Lysle Dingwall 11/01/2020, 8:43 PM

## 2020-11-01 NOTE — Progress Notes (Addendum)
[place] 2M10 [facility]) Hospital Liaison Note  This patient has been previously referred to [facility] for outpatient based Palliative Care.  We will continue to follow for disposition.  Please call, should you have any hospice or palliative related questions.  Thank you, Margaretmary Eddy, BSN, RN [facility] Liaison [phone]

## 2020-11-01 NOTE — Progress Notes (Signed)
[facility] Progress Note Patient Name: Ian [name] DOB: 11-05-1925 MRN: [id number]   Date of Service  11/01/2020  HPI/Events of Note  Hypothermia - Temp = 36.1.  eICU Interventions  Plan: 1. Bair Hugger PRN.      Intervention Category Major Interventions: Other:  Lysle Dingwall 11/01/2020, 4:59 AM

## 2020-11-01 NOTE — Care Plan (Signed)
Unable to obtain abg X2

## 2020-11-01 NOTE — Plan of Care (Signed)
  Problem: Clinical Measurements: Goal: Respiratory complications will improve Outcome: Not Progressing Note: Pt is requiring increased level of ventilator support.    Problem: Activity: Goal: Risk for activity intolerance will decrease Outcome: Not Progressing Note: Pt is unresponsive and in critical condition. Unable to mobilize at this time.    Problem: Nutrition: Goal: Adequate nutrition will be maintained Outcome: Not Progressing Note: Unable to place OG tube after multiple attempts overnight. Order for Cortrak to be placed today.

## 2020-11-01 NOTE — Progress Notes (Signed)
Nutrition Brief Note  Cortrak placed this morning and tip is in the stomach.  Discussed patient in ICU rounds, MD discussed to proceed with tube feedings. RD to order tube feedings. Patient with new stage 3 pressure injury to sacrum.  Initiate tube feed via Cortrak:  Vital AF 1.2 at 70 ml/h (1680 ml per day) to provides 2016 kcal, 126 gm protein, 1361 ml free water daily   RD will follow to monitor tube feed toleration.   [name], Dietetic Intern 11/01/2020 11:23 AM

## 2020-11-01 NOTE — Consult Note (Signed)
[facility] Nurse Consult Note: Patient receiving care in [facility] 2M10.  Reason for Consult: sacral wound Wound type: unclear at this time.  Patient arrived with it.  It could have been a fluid filled area and the bulla ruptured, and now with a loss of overlying tissue. Pressure Injury POA: Yes Measurement: the entire wound area measures 7 cm x 10 cm.  Within this and localized on the left upper buttock/lower sacral area, there is a darkened spot that is consistent with an evolving DTPI; area is moving towards unstageable PI. Wound bed: all is red and moist except for the darkened area. Drainage (amount, consistency, odor) none Periwound: fragile, patient with anasarca Dressing procedure/placement/frequency:  Apply Santyl to the sacral wound in a nickel thick layer. Cover with a vaseline gauze, then foam dressing.  Change daily. Thank you for the consult.  Discussed plan of care with the  bedside nurse.  [place] nurse will not follow at this time.  Please re-consult the [place] team if needed.  Val Riles, RN, MSN, CWOCN, CNS-BC, pager [phone]

## 2020-11-01 NOTE — Progress Notes (Signed)
[facility] Progress Note Patient Name: Ian [name] DOB: February 17, 1926 MRN: [id number]   Date of Service  11/01/2020  HPI/Events of Note  Hypokalemia - K+ = 3.3 and Creatinine = 2.05.   eICU Interventions  Will replace K+.      Intervention Category Major Interventions: Electrolyte abnormality - evaluation and management  Sommer,Steven Eugene 11/01/2020, 5:21 AM

## 2020-11-01 NOTE — Progress Notes (Signed)
NAME:  Ian [name], MRN:  [id number], DOB:  07-05-1926, LOS: 2 ADMISSION DATE:  11/11/2020, CONSULTATION DATE:  11/01/20 REFERRING MD:  EDP, CHIEF COMPLAINT:  Respiratory distress   Brief History:  87 y.o. M who presented from [place] after a choking episode while eating and progressive altered mental status.  He was intubated and covered with broad spectrum antibiotics and PCCM consulted for admission  History of Present Illness:  87 y.o. M with PMH of HTN, stroke and recent admission after a fall and PNA, discharged March 1st to [place].   On 3/7 he was noted to have a choking episode while eating and had progressively worsening mental status and oxygenation and so was brought to the ED and required bagging en route.   He was intubated in the ED, CXR with bilateral airspace disease and effusions.  Labs significant for WBC 17.8, lactic acid and metabolic panel still pending.  His wife was present and confirmed DNR, but did want intubation.  He was started on Zosyn and PCCM consulted for admission   Past Medical History:   has a past medical history of Benign neoplasm of parathyroid gland (03/31/2013), Calculus of kidney (03/31/2013), Hypertension, Leukocytosis (10/26/2017), Loss of weight (10/26/2017), Multiple leg contusions, unspecified laterality, sequela (10/26/2017), Scalp hematoma, subsequent encounter (10/26/2017), and Stroke ([place]).   Significant Hospital Events:  3/7 Admit to PCCM  Consults:    Procedures:  ETT 3/7  Significant Diagnostic Tests:  3/7 CXR>>Bilateral airspace disease and effusions, compatible with edema.  Micro Data:  3/7 Covid, Flu>> negative 3/7 BCx2>> 3/7 urine culture>> negative  Antimicrobials:  Zosyn 3/7 >>  Vancomycin 3/7 >> 3/8  Interim History / Subjective:  Atrial fibrillation 3/8, amiodarone started. Norepinephrine uptitrated as was fentanyl Precedex last 24 hours.  Sedation and now off Potassium replaced Serum creatinine stable 2.05,  urine output 527 cc last 24 hours I/O+ 6.1 L total 0.80, PEEP 8  Objective   Blood pressure (!) 86/58, pulse 69, temperature 97.88 F (36.6 C), resp. rate 19, height 5\' 11"  (1.803 m), weight 75.7 kg, SpO2 97 %.    Vent Mode: PRVC FiO2 (%):  [70 %-100 %] 80 % Set Rate:  [18 bmp] 18 bmp Vt Set:  [600 mL] 600 mL PEEP:  [8 cmH20] 8 cmH20 Plateau Pressure:  [19 cmH20-23 cmH20] 19 cmH20   Intake/Output Summary (Last 24 hours) at 11/01/2020 6283 Last data filed at 11/01/2020 0800 Gross per 24 hour  Intake 3909.1 ml  Output 536 ml  Net 3373.1 ml   Filed Weights   10/31/20 0015 10/31/20 0500 11/01/20 0258  Weight: 73.2 kg 73.2 kg 75.7 kg    General: Ill-appearing man, unresponsive, ventilated HEENT: ET tube in place, oropharynx otherwise clear, small pupils but sluggishly reactive, c-collar in place Neuro: No response to voice, possible grimace with stimulation, does not move volitionally or to stimulation CV: Irregularly irregular, distant, no murmur, 80s PULM: Bilateral scattered inspiratory rhonchi without wheezing GI: Nondistended, hypoactive bowel sounds present Extremities: Mild upper extremity edema, no lower extremity edema, bilateral upper extremity ecchymoses Skin: No breakdown or rash  Resolved Hospital Problem list     Assessment & Plan:   Acute Hypoxemic Respiratory Failure in setting of known aspiration risk, suspected aspiration pneumonia, a LI - Intubated in the ED, wife confirmed DNR but wanted intubation P:  -Continue PRVC 8 cc/kg.  Wean FiO2 as able.  May need to increase PEEP from 8 >> 10.  Transition to 6 cc/kg depending on  ventilation, will likely recheck ABG 3/9 and adjust if possible -Continue empiric Zosyn, covering aspiration/HCAP -Follow chest x-ray, culture data -Sedation as per PAD protocol, Precedex and fentanyl -VAP prevention orders  Septic Shock secondary to aspiration pneumonia +/- UTI.  Urine culture 3/7 negative -Continue Zosyn as  ordered -Wean norepinephrine as able -Volume resuscitation -Lactic acid has cleared -Continue to follow culture data and tailor antibiotics appropriately  Acute Kidney Injury, oliguric/anuric Hypernatremia, improved Hypokalemia, hypomagnesemia -Continue D5W 100 cc/h until enteral access, then transition to free water flushes per tube -Continue to follow urine output, BMP.  At risk for fulminant renal failure but for now appears to be holding his own with some urine output and stable serum creatinine.  Poor overall candidate for HD.  I discussed this with his wife who would not want to pursue HD if he declined  Stress NSTEMI, due to sepsis, demand Atrial fibrillation -Amiodarone started 3/8, following telemetry -Have not started systemic anticoagulation, remains on prophylactic heparin.  If A. fib persists then could consider  Hypothyroidism -Synthroid ordered per tube, will change to IV if unable to get enteral access on 3/9  History dens fracture -Continue c-collar  Best practice (evaluated daily)  Diet: NPO Pain/Anxiety/Delirium protocol (if indicated): Fentanyl, precedex VAP protocol (if indicated): HOB 30 degrees, suction prn DVT prophylaxis: heparin GI prophylaxis: PPI Glucose control: SSI Mobility: bed rest Disposition: ICU  Goals of Care:  Last date of multidisciplinary goals of care discussion: 3/9 Family and staff present: Wife, critical care RN Summary of discussion: Plan to continue aggressive care for at least another 24 hours then assess status.  If progress then could continue aggressive care.  If stagnant or if decline then would consider a transition off mechanical ventilation and comfort care.  Wife agrees that he would not benefit from HD Follow up goals of care discussion due: 3/ 16 Code Status: DNR Family: Reviewed status and plans with the patient's wife at bedside 3/9   Labs   CBC: Recent Labs  Lab 10/27/2020 1633 11/10/2020 1635 11/16/2020 2057  10/31/20 0121 11/01/20 0225  WBC  --  17.8* 16.6* 20.2* 29.3*  NEUTROABS  --  16.6*  --   --   --   HGB 11.2* 12.7* 14.0 11.6* 11.1*  HCT 33.0* 41.6 42.8 35.4* 34.3*  MCV  --  106.1* 102.4* 102.3* 102.7*  PLT  --  267 273 234 960    Basic Metabolic Panel: Recent Labs  Lab 10/25/2020 1633 11/01/2020 1635 11/07/2020 2057 10/31/20 0121 11/01/20 0225  NA 153* 152* 150* 147* 137  K 3.5 4.0 3.4* 3.3* 3.3*  CL  --  120* 116* 118* 108  CO2  --  20* 20* 17* 17*  GLUCOSE  --  75 109* 126* 119*  BUN  --  42* 42* 47* 48*  CREATININE  --  1.87* 1.99* 2.01* 2.05*  CALCIUM  --  11.4* 11.4* 10.8* 10.2  MG  --   --   --  1.4* 1.8  PHOS  --   --   --  2.8 3.3   GFR: Estimated Creatinine Clearance: 24.5 mL/min (A) (by C-G formula based on SCr of 2.05 mg/dL (H)). Recent Labs  Lab 11/01/2020 1625 11/13/2020 1635 11/04/2020 1731 11/01/2020 2057 10/31/20 0121 11/01/20 0225  PROCALCITON  --   --   --  3.67 5.32 6.18  WBC  --  17.8*  --  16.6* 20.2* 29.3*  LATICACIDVEN 3.2*  --  2.7*  --   --   --  Liver Function Tests: Recent Labs  Lab 11/01/2020 1635 11/02/2020 2057 11/01/20 0225  AST 42* 55* 40  ALT 22 25 27   ALKPHOS 89 86 82  BILITOT 2.1* 2.0* 1.3*  PROT 5.1* 5.4* 4.5*  ALBUMIN 1.7* 1.8* 1.2*   No results for input(s): LIPASE, AMYLASE in the last 168 hours. No results for input(s): AMMONIA in the last 168 hours.  ABG    Component Value Date/Time   PHART 7.378 10/28/2020 1633   PCO2ART 40.8 11/10/2020 1633   PO2ART 100 10/25/2020 1633   HCO3 21.0 10/31/2020 0418   TCO2 25 11/12/2020 1633   ACIDBASEDEF 4.4 (H) 10/31/2020 0418   O2SAT 81.5 10/31/2020 0418     Coagulation Profile: No results for input(s): INR, PROTIME in the last 168 hours.  Cardiac Enzymes: No results for input(s): CKTOTAL, CKMB, CKMBINDEX, TROPONINI in the last 168 hours.  HbA1C: Hgb A1c MFr Bld  Date/Time Value Ref Range Status  11/11/2020 04:35 PM 5.2 4.8 - 5.6 % Final    Comment:    (NOTE) Pre  diabetes:          5.7%-6.4%  Diabetes:              >6.4%  Glycemic control for   <7.0% adults with diabetes   10/25/2017 07:08 PM 4.8 4.8 - 5.6 % Final    Comment:    (NOTE) Pre diabetes:          5.7%-6.4% Diabetes:              >6.4% Glycemic control for   <7.0% adults with diabetes     CBG: Recent Labs  Lab 10/31/20 1933 10/31/20 2323 10/31/20 2348 11/01/20 0334 11/01/20 0755  GLUCAP 85 63* 96 85 115*    Critical care time: 34 minutes     Baltazar Apo, MD, PhD 11/01/2020, 9:09 AM [facility] [phone] or if no answer before 7:00PM call [phone] For any issues after 7:00PM please call eLink [phone]

## 2020-11-02 ENCOUNTER — Inpatient Hospital Stay (HOSPITAL_COMMUNITY): Payer: Medicare Other

## 2020-11-02 DIAGNOSIS — J9601 Acute respiratory failure with hypoxia: Secondary | ICD-10-CM | POA: Diagnosis not present

## 2020-11-02 LAB — GLUCOSE, CAPILLARY
Glucose-Capillary: 101 mg/dL — ABNORMAL HIGH (ref 70–99)
Glucose-Capillary: 113 mg/dL — ABNORMAL HIGH (ref 70–99)
Glucose-Capillary: 130 mg/dL — ABNORMAL HIGH (ref 70–99)
Glucose-Capillary: 134 mg/dL — ABNORMAL HIGH (ref 70–99)
Glucose-Capillary: 152 mg/dL — ABNORMAL HIGH (ref 70–99)
Glucose-Capillary: 139 mg/dL — ABNORMAL HIGH (ref 70–99)

## 2020-11-02 LAB — CBC
HCT: 34.4 % — ABNORMAL LOW (ref 39.0–52.0)
HCT: 33.1 % — ABNORMAL LOW (ref 39.0–52.0)
Hemoglobin: 11 g/dL — ABNORMAL LOW (ref 13.0–17.0)
Hemoglobin: 10.5 g/dL — ABNORMAL LOW (ref 13.0–17.0)
MCH: 32.6 pg (ref 26.0–34.0)
MCH: 32.3 pg (ref 26.0–34.0)
MCHC: 32 g/dL (ref 30.0–36.0)
MCHC: 31.7 g/dL (ref 30.0–36.0)
MCV: 102.1 fL — ABNORMAL HIGH (ref 80.0–100.0)
MCV: 101.8 fL — ABNORMAL HIGH (ref 80.0–100.0)
Platelets: 171 10*3/uL (ref 150–400)
Platelets: 145 10*3/uL — ABNORMAL LOW (ref 150–400)
RBC: 3.37 MIL/uL — ABNORMAL LOW (ref 4.22–5.81)
RBC: 3.25 MIL/uL — ABNORMAL LOW (ref 4.22–5.81)
RDW: 14.2 % (ref 11.5–15.5)
RDW: 14.4 % (ref 11.5–15.5)
WBC: 27 10*3/uL — ABNORMAL HIGH (ref 4.0–10.5)
WBC: 23.4 10*3/uL — ABNORMAL HIGH (ref 4.0–10.5)
nRBC: 2.3 % — ABNORMAL HIGH (ref 0.0–0.2)
nRBC: 3.2 % — ABNORMAL HIGH (ref 0.0–0.2)

## 2020-11-02 LAB — BASIC METABOLIC PANEL
Anion gap: 12 (ref 5–15)
Anion gap: 10 (ref 5–15)
BUN: 51 mg/dL — ABNORMAL HIGH (ref 8–23)
BUN: 58 mg/dL — ABNORMAL HIGH (ref 8–23)
CO2: 18 mmol/L — ABNORMAL LOW (ref 22–32)
CO2: 17 mmol/L — ABNORMAL LOW (ref 22–32)
Calcium: 9.5 mg/dL (ref 8.9–10.3)
Calcium: 9 mg/dL (ref 8.9–10.3)
Chloride: 106 mmol/L (ref 98–111)
Chloride: 110 mmol/L (ref 98–111)
Creatinine, Ser: 1.95 mg/dL — ABNORMAL HIGH (ref 0.61–1.24)
Creatinine, Ser: 1.87 mg/dL — ABNORMAL HIGH (ref 0.61–1.24)
GFR, Estimated: 32 mL/min — ABNORMAL LOW (ref >60)
GFR, Estimated: 33 mL/min — ABNORMAL LOW (ref >60)
Glucose, Bld: 183 mg/dL — ABNORMAL HIGH (ref 70–99)
Glucose, Bld: 225 mg/dL — ABNORMAL HIGH (ref 70–99)
Potassium: 3.2 mmol/L — ABNORMAL LOW (ref 3.5–5.1)
Potassium: 3.7 mmol/L (ref 3.5–5.1)
Sodium: 136 mmol/L (ref 135–145)
Sodium: 137 mmol/L (ref 135–145)

## 2020-11-02 LAB — MAGNESIUM
Magnesium: 1.6 mg/dL — ABNORMAL LOW (ref 1.7–2.4)
Magnesium: 2.1 mg/dL (ref 1.7–2.4)

## 2020-11-02 MED ORDER — POTASSIUM CHLORIDE 10 MEQ/50ML IV SOLN
10.0000 meq | INTRAVENOUS | Status: AC
  Administered 2020-11-02 (×4): 10 meq via INTRAVENOUS
  Filled 2020-11-02 (×4): qty 50

## 2020-11-02 MED ORDER — MAGNESIUM SULFATE 4 GM/100ML IV SOLN
4.0000 g | Freq: Once | INTRAVENOUS | Status: AC
  Administered 2020-11-02: 4 g via INTRAVENOUS
  Filled 2020-11-02: qty 100

## 2020-11-02 MED ORDER — VITAL AF 1.2 CAL PO LIQD
1000.0000 mL | ORAL | Status: DC
  Administered 2020-11-02: 1000 mL
  Filled 2020-11-02 (×3): qty 1000

## 2020-11-02 MED ORDER — POTASSIUM CHLORIDE 20 MEQ PO PACK
40.0000 meq | PACK | Freq: Once | ORAL | Status: AC
  Administered 2020-11-02: 40 meq
  Filled 2020-11-02: qty 2

## 2020-11-02 MED ORDER — POTASSIUM CHLORIDE 20 MEQ PO PACK
20.0000 meq | PACK | ORAL | Status: AC
  Administered 2020-11-02 (×2): 20 meq
  Filled 2020-11-02 (×2): qty 1

## 2020-11-02 NOTE — Progress Notes (Signed)
[facility] ADULT ICU REPLACEMENT PROTOCOL   The patient does apply for the [facility] Adult ICU Electrolyte Replacment Protocol based on the criteria listed below:   1. Is GFR >/= 30 ml/min? Yes.    Patient's GFR today is 33 2. Is SCr </= 2? Yes.   Patient's SCr is 1.87 ml/kg/hr 3. Did SCr increase >/= 0.5 in 24 hours? No. 4. Abnormal electrolyte(s):k 3.7 with ectopy  5. Ordered repletion with: protocol 6. If a panic level lab has been reported, has the CCM MD in charge been notified? Yes.  .   Physician:  Dr Bonne Dolores, [place] 11/02/2020 9:32 PM

## 2020-11-02 NOTE — Progress Notes (Signed)
NAME:  Ian [name], MRN:  [id number], DOB:  [birth year], LOS: 3 ADMISSION DATE:  11/04/2020, CONSULTATION DATE:  11/02/20 REFERRING MD:  EDP, CHIEF COMPLAINT:  Respiratory distress   Brief History:  87 y.o. M who presented from [place] after a choking episode while eating and progressive altered mental status.  He was intubated and covered with broad spectrum antibiotics and PCCM consulted for admission  History of Present Illness:  87 y.o. M with PMH of HTN, stroke and recent admission after a fall and PNA, discharged March 1st to [place].   On 3/7 he was noted to have a choking episode while eating and had progressively worsening mental status and oxygenation and so was brought to the ED and required bagging en route.   He was intubated in the ED, CXR with bilateral airspace disease and effusions.  Labs significant for WBC 17.8, lactic acid and metabolic panel still pending.  His wife was present and confirmed DNR, but did want intubation.  He was started on Zosyn and PCCM consulted for admission   Past Medical History:   has a past medical history of Benign neoplasm of parathyroid gland (03/31/2013), Calculus of kidney (03/31/2013), Hypertension, Leukocytosis (10/26/2017), Loss of weight (10/26/2017), Multiple leg contusions, unspecified laterality, sequela (10/26/2017), Scalp hematoma, subsequent encounter (10/26/2017), and Stroke ([place]).   Significant Hospital Events:  3/7 Admit to PCCM  Consults:    Procedures:  ETT 3/7  Significant Diagnostic Tests:  3/7 CXR>>Bilateral airspace disease and effusions, compatible with edema.  Micro Data:  3/7 Covid, Flu>> negative 3/7 BCx2>> 3/7 urine culture>> negative  Antimicrobials:  Zosyn 3/7 >>  Vancomycin 3/7 >> 3/8  Interim History / Subjective:  Little interval change over last 24-48 hours Remains on norepinephrine 22, amiodarone infusion He has been on fentanyl, Precedex, weaning this morning Serum creatinine stable  1.95 Urine output 840 cc, I/O+ 10 L total   Objective   Blood pressure (!) 89/62, pulse 85, temperature 98.1 F (36.7 C), temperature source Oral, resp. rate (!) 22, height 5\' 11"  (1.803 m), weight 78.9 kg, SpO2 97 %.    Vent Mode: PRVC FiO2 (%):  [50 %-70 %] 50 % Set Rate:  [18 bmp] 18 bmp Vt Set:  [600 mL] 600 mL PEEP:  [8 cmH20] 8 cmH20 Plateau Pressure:  [20 cmH20-31 cmH20] 20 cmH20   Intake/Output Summary (Last 24 hours) at 11/02/2020 0859 Last data filed at 11/02/2020 0800 Gross per 24 hour  Intake 4629.65 ml  Output 820 ml  Net 3809.65 ml   Filed Weights   10/31/20 0500 11/01/20 0258 11/02/20 0327  Weight: 73.2 kg 75.7 kg 78.9 kg    General: Ill-appearing man, ventilated HEENT: C-collar in place, ET tube in place.  Pupils small but reactive, left slightly larger than right. Neuro: Does not wake to voice or pain.  Did see some random spontaneous movement of the right upper extremity, otherwise comatose (sedation just stopped) CV: Distant, irregularly irregular, no murmur PULM: Bilateral scattered inspiratory rhonchi, decreased at both bases, no wheeze GI: Nondistended, hypoactive bowel sounds Extremities: 1+ bilateral upper extremity edema with some scattered ecchymoses, no lower extremity edema Skin: No rash  Resolved Hospital Problem list     Assessment & Plan:   Acute Hypoxemic Respiratory Failure in setting of known aspiration risk, suspected aspiration pneumonia, a LI - Intubated in the ED, wife confirmed DNR but wanted intubation P:  -Continuing PRVC 8 cc/kg, remains on moderate PEEP, FiO2.  Unfortunately no significant interval change.  Overall clinical status including respiratory status, neurological status suggest poor prognosis for meaningful recovery here. -On empiric Zosyn covering aspiration/HCAP -Intermittently following chest x-ray -Follow culture data to completion -Sedation as per PAD orders, Precedex and fentanyl -VAP prevention order  set  Septic Shock secondary to aspiration pneumonia +/- UTI.  Urine culture 3/7 negative -Day 4 of 7 Zosyn on 3/10 -Remains on stable dose norepinephrine but weaning has not been possible -Continue intermittent volume resuscitation -Follow culture data  Acute Kidney Injury, oliguric/anuric Hypernatremia, improved Hypokalemia, hypomagnesemia -D5W discontinued on 3/9.  May need to add back free water depending on sodium trend -Follow urine output, BMP -Not a candidate for HD  Stress NSTEMI, due to sepsis, demand Atrial fibrillation -Continue amiodarone for now, consider discontinuing if we are plan to transition to comfort -Hold off on systemic anticoagulation  Hypothyroidism -Synthroid per tube  History dens fracture -Continue c-collar  Best practice (evaluated daily)  Diet: NPO Pain/Anxiety/Delirium protocol (if indicated): Fentanyl, precedex VAP protocol (if indicated): HOB 30 degrees, suction prn DVT prophylaxis: heparin GI prophylaxis: PPI Glucose control: SSI Mobility: bed rest Disposition: ICU  Goals of Care:  Last date of multidisciplinary goals of care discussion: 3/10  family and staff present: Wife, critical care RN Summary of discussion: Discussed his current condition superimposed on his chronic conditions, poor prognosis for meaningful recovery.  His wife understands.  She indicates that he would not want continued invasive support without a chance for quick improvement, meaningful recovery to his previous baseline (which was already limited).  We will plan to orchestrate a formal transition to comfort and withdrawal of care.  She needs to talk to some of his children, other family to see if they he are available or want to come to [facility] to visit him.  We will try to give some time to see if such visitation is possible.  His wife does not want to prolong support significantly in order to gather family.  We will touch base with her again at the end of the day  3/10 Follow up goals of care discussion due: 3/ 17 Code Status: DNR Family: Reviewed status and plans with the patient's wife at bedside 3/10   Labs   CBC: Recent Labs  Lab 11/02/2020 1635 11/22/2020 2057 10/31/20 0121 11/01/20 0225 11/02/20 0213  WBC 17.8* 16.6* 20.2* 29.3* 27.0*  NEUTROABS 16.6*  --   --   --   --   HGB 12.7* 14.0 11.6* 11.1* 11.0*  HCT 41.6 42.8 35.4* 34.3* 34.4*  MCV 106.1* 102.4* 102.3* 102.7* 102.1*  PLT 267 273 234 186 720    Basic Metabolic Panel: Recent Labs  Lab 11/11/2020 1635 11/14/2020 2057 10/31/20 0121 11/01/20 0225 11/02/20 0213  NA 152* 150* 147* 137 136  K 4.0 3.4* 3.3* 3.3* 3.2*  CL 120* 116* 118* 108 106  CO2 20* 20* 17* 17* 18*  GLUCOSE 75 109* 126* 119* 183*  BUN 42* 42* 47* 48* 51*  CREATININE 1.87* 1.99* 2.01* 2.05* 1.95*  CALCIUM 11.4* 11.4* 10.8* 10.2 9.5  MG  --   --  1.4* 1.8 1.6*  PHOS  --   --  2.8 3.3  --    GFR: Estimated Creatinine Clearance: 25.7 mL/min (A) (by C-G formula based on SCr of 1.95 mg/dL (H)). Recent Labs  Lab 11/06/2020 1625 11/12/2020 1635 11/04/2020 1731 11/06/2020 2057 10/31/20 0121 11/01/20 0225 11/02/20 0213  PROCALCITON  --   --   --  3.67 5.32 6.18  --  WBC  --    < >  --  16.6* 20.2* 29.3* 27.0*  LATICACIDVEN 3.2*  --  2.7*  --   --   --   --    < > = values in this interval not displayed.    Liver Function Tests: Recent Labs  Lab 10/24/2020 1635 11/20/2020 2057 11/01/20 0225  AST 42* 55* 40  ALT 22 25 27   ALKPHOS 89 86 82  BILITOT 2.1* 2.0* 1.3*  PROT 5.1* 5.4* 4.5*  ALBUMIN 1.7* 1.8* 1.2*   No results for input(s): LIPASE, AMYLASE in the last 168 hours. No results for input(s): AMMONIA in the last 168 hours.  ABG    Component Value Date/Time   PHART 7.378 11/23/2020 1633   PCO2ART 40.8 11/08/2020 1633   PO2ART 100 11/10/2020 1633   HCO3 21.0 10/31/2020 0418   TCO2 25 11/22/2020 1633   ACIDBASEDEF 4.4 (H) 10/31/2020 0418   O2SAT 81.5 10/31/2020 0418     Coagulation Profile: No  results for input(s): INR, PROTIME in the last 168 hours.  Cardiac Enzymes: No results for input(s): CKTOTAL, CKMB, CKMBINDEX, TROPONINI in the last 168 hours.  HbA1C: Hgb A1c MFr Bld  Date/Time Value Ref Range Status  11/20/2020 04:35 PM 5.2 4.8 - 5.6 % Final    Comment:    (NOTE) Pre diabetes:          5.7%-6.4%  Diabetes:              >6.4%  Glycemic control for   <7.0% adults with diabetes   10/25/2017 07:08 PM 4.8 4.8 - 5.6 % Final    Comment:    (NOTE) Pre diabetes:          5.7%-6.4% Diabetes:              >6.4% Glycemic control for   <7.0% adults with diabetes     CBG: Recent Labs  Lab 11/01/20 1519 11/01/20 1932 11/01/20 2321 11/02/20 0323 11/02/20 0737  GLUCAP 82 92 99 101* 113*    Critical care time: 32 minutes     Baltazar Apo, MD, PhD 11/02/2020, 8:59 AM [facility] [phone] or if no answer before 7:00PM call [phone] For any issues after 7:00PM please call eLink [phone]

## 2020-11-02 NOTE — Progress Notes (Addendum)
Nutrition Follow-up  DOCUMENTATION CODES:   Non-severe (moderate) malnutrition in context of chronic illness  INTERVENTION:   Tube feeding via Cortrak: Vital AF 1.2 increase goal rate to 75 ml/h (1800 ml per day)  Provides 2160 kcal, 135 gm protein, 1458 ml free water daily  -Continue MVI with minerals via tube daily   -Provide Juven 1 packet via tube BID. Each packet provides 95 kcal, 7 grams L-Arginine, 7 grams L-Glutamine, 2.5 grams collagen protein, 300 mg vitamin C, 9.5 mg zinc, and other micronutrients essential for wound healing  NUTRITION DIAGNOSIS:   Moderate Malnutrition related to chronic illness (dysphagia from stroke and dementia) as evidenced by moderate muscle depletion,moderate fat depletion,mild fat depletion,mild muscle depletion.  Ongoing.  GOAL:   Patient will meet greater than or equal to 90% of their needs  Met with tube feeds.  MONITOR:   TF tolerance,Skin,I & O's,Labs,Weight trends,Vent status  REASON FOR ASSESSMENT:   Ventilator    ASSESSMENT:   63 YOM presented from [place] after choking episode while eating, admitted for respiratory failure with septic shock secondary to aspiration pne. Recent admission after a fall and pne, discharged 3/01 to [facility]. PMH of benign neoplasm of parathyroid gland, leukocytosis, scalp hematoma, HTN, stroke, ETOH abuse, dementia, dysphagia.  Discussed pt during rounds. RN reports patient is tolerating tube feed regimen at goal rate. Pt may transition to comfort care in the next few days.  Patient is currently intubated on ventilator support MV: 18.6 L/min Temp (24hrs), Avg:99.6 F (37.6 C), Min:98.1 F (36.7 C), Max:100.4 F (38 C) MAP: 68-81 Propofol: none  Pt's weights reviewed and show an increase in weight since admission likely due to fluid.  Admit weight: 161.38#  Current weight: 173.94#  Meds reviewed: Colace (BID), Miralax (daily), Levophed  Labs reviewed: Potassium (3.2, low),  Magnesium (1.6, low), CBG (82-113)  UOP: 840 mL x 24 hrs I&O's reviewed: +10,223.8 since admit  Diet Order:   Diet Order            Diet NPO time specified  Diet effective now                 EDUCATION NEEDS:   No education needs have been identified at this time  Skin:  Skin Assessment: Skin Integrity Issues: Skin Integrity Issues:: Stage III DTI: Sacrum Stage III: sacrum Incisions: Skin tear Pretibial L erythematous,skin tear medial vertebral column, skin tear medial buttocks  Last BM:  Unknown.  Height:   Ht Readings from Last 1 Encounters:  11/21/2020 $RemoveB'5\' 11"'[id number]$  (1.803 m)    Weight:   Wt Readings from Last 1 Encounters:  11/02/20 78.9 kg    Ideal Body Weight:  78.2 kg  BMI:  Body mass index is 24.26 kg/m.  Estimated Nutritional Needs:   Kcal:  2219   Protein:  115-135 g  Fluid:  >/=2.2 L    [name], Dietetic Intern 11/02/2020 11:48 AM

## 2020-11-02 NOTE — Progress Notes (Signed)
[facility] ADULT ICU REPLACEMENT PROTOCOL   The patient does apply for the [facility] Adult ICU Electrolyte Replacment Protocol based on the criteria listed below:   1. Is GFR >/= 30 ml/min? Yes.    Patient's GFR today is 32 2. Is SCr </= 2? Yes.   Patient's SCr is 1.95 ml/kg/hr 3. Did SCr increase >/= 0.5 in 24 hours? No. 4. Abnormal electrolyte(s):k 3.2, Mag 1.6 5. Ordered repletion with: protocol 6. If a panic level lab has been reported, has the CCM MD in charge been notified? No..   Physician:    Ronda Fairly A 11/02/2020 3:08 AM

## 2020-11-03 DIAGNOSIS — Z9911 Dependence on respirator [ventilator] status: Secondary | ICD-10-CM

## 2020-11-03 DIAGNOSIS — J9601 Acute respiratory failure with hypoxia: Secondary | ICD-10-CM | POA: Diagnosis not present

## 2020-11-03 DIAGNOSIS — N179 Acute kidney failure, unspecified: Secondary | ICD-10-CM

## 2020-11-03 DIAGNOSIS — Z7189 Other specified counseling: Secondary | ICD-10-CM

## 2020-11-03 LAB — GLUCOSE, CAPILLARY
Glucose-Capillary: 154 mg/dL — ABNORMAL HIGH (ref 70–99)
Glucose-Capillary: 163 mg/dL — ABNORMAL HIGH (ref 70–99)

## 2020-11-03 MED ORDER — MIDAZOLAM BOLUS VIA INFUSION
1.0000 mg | INTRAVENOUS | Status: DC | PRN
  Administered 2020-11-03: 2 mg via INTRAVENOUS
  Filled 2020-11-03: qty 2

## 2020-11-03 MED ORDER — MIDAZOLAM 50MG/50ML (1MG/ML) PREMIX INFUSION
0.0000 mg/h | INTRAVENOUS | Status: DC
  Administered 2020-11-03: 2 mg/h via INTRAVENOUS
  Filled 2020-11-03: qty 50

## 2020-11-04 LAB — CULTURE, BLOOD (ROUTINE X 2)
Culture: NO GROWTH
Culture: NO GROWTH

## 2020-11-24 NOTE — Death Summary Note (Signed)
Physician Discharge Summary  Patient ID: Delmar Arriaga MRN: [id number] DOB/AGE: 11/17/1925 87 y.o.  Admit date: 11/17/2020 Discharge date: November 07, 2020  Admission Diagnoses: Acute hypoxic respiratory failure Septic shock secondary to aspiration pneumonia AKI Hypernatremia NSTEMI Acute encephalopathy  Discharge Diagnoses:  Active Problems:   Respiratory failure (HCC)   Respiratory failure with hypoxia (HCC) Septic shock, Severe sepsis due to aspiration pneumonia AKI Pyuria Hypernatremia Hypokalemia Hypomagnesemia Type II MI Atrial fibrillation Hyperglycemia Acute on chronic anemia History of dens fracture of the cervical spine  Discharged Condition:  deceased  Hospital Course: Mr. Delahoussaye was admitted to the hospital and treated empirically for sepsis due to aspiration pneumonia and treated with aggressive respiratory support including mechanical ventilation, antibiotics, vasopressors.  Given his poor baseline functional status, high risk repeat aspiration events, lack of significant improvement with empiric therapy, and multiorgan failure decision was made to withdraw aggressive support and focus on comfort.  Palliative care was involved in his care throughout his admission. He expired with his wife at bedside on 2020-11-07 at 9:29AM.  Consults:  PCCM Wound care palliative care  Significant Diagnostic Studies:  Blood cultures 10/29/2020-no growth to date Urine culture 11/02/2020--no growth CXR demonstrating LLL pneumonia   Treatments:  Mechanical ventilation Analgesia Antibiotics Vasopressors IV fluids Electrolyte management Antiarrhythmics for rhythm and HR control     Signed: Julian Hy 2020/11/07, 5:58 PM

## 2020-11-24 NOTE — Progress Notes (Signed)
Pt extubated to room air. RN at bedside.

## 2020-11-24 NOTE — Progress Notes (Signed)
NAME:  Ian [name], MRN:  [id number], DOB:  17-Dec-1925, LOS: 4 ADMISSION DATE:  11/15/2020, CONSULTATION DATE:  Nov 10, 2020 REFERRING MD:  EDP, CHIEF COMPLAINT:  Respiratory distress   Brief History:  87 y.o. M who presented from [place] after a choking episode while eating and progressive altered mental status.  He was intubated and covered with broad spectrum antibiotics and PCCM consulted for admission  History of Present Illness:  87 y.o. M with PMH of HTN, stroke and recent admission after a fall and PNA, discharged March 1st to [place].   On 3/7 he was noted to have a choking episode while eating and had progressively worsening mental status and oxygenation and so was brought to the ED and required bagging en route.   He was intubated in the ED, CXR with bilateral airspace disease and effusions.  Labs significant for WBC 17.8, lactic acid and metabolic panel still pending.  His wife was present and confirmed DNR, but did want intubation.  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   Significant Hospital Events:  3/7 Admit to PCCM  Consults:    Procedures:  ETT 3/7  Significant Diagnostic Tests:  3/7 CXR>>Bilateral airspace disease and effusions, compatible with edema.  Micro Data:  3/7 Covid, Flu>> negative 3/7 BCx2>> 3/7 urine culture>> negative  Antimicrobials:  Zosyn 3/7 >>  Vancomycin 3/7 >> 3/8  Interim History / Subjective:  Wife at bedside. Still breathing over vent on sedation. On high dose norepi.   Objective   Blood pressure (!) 83/46, pulse 93, temperature (!) 103.1 F (39.5 C), temperature source  Oral, resp. rate (!) 28, height 5\' 11"  (1.803 m), weight 78.9 kg, SpO2 91 %.    Vent Mode: PRVC FiO2 (%):  [50 %-60 %] 60 % Set Rate:  [18 bmp] 18 bmp Vt Set:  [600 mL] 600 mL PEEP:  [8 cmH20] 8 cmH20 Plateau Pressure:  [20 cmH20] 20 cmH20   Intake/Output Summary (Last 24 hours) at 11/10/2020 0827 Last data filed at 11/10/2020 0800 Gross per 24 hour  Intake 3725.04 ml  Output 465 ml  Net 3260.04 ml   Filed Weights   11/01/20 0258 11/02/20 0327 11/10/2020 0500  Weight: 75.7 kg 78.9 kg 78.9 kg    General: critically ill appearing man laying in bed in NAD HEENT: temporal wasting, endotracheal tube in place Neuro: Examined on fentanyl infusion.  No response during exam, no cough reflex with suctioning.  Pupils pinpoint. CV: S1-S2, regular rate and rhythm PULM: Thick yellow endotracheal secretions, no wheezing, tachypneic breathing above the vent GI: Soft, hypoactive bowel sounds Extremities: Upper extremity edema with weeping, left greater than right lower extremity edema Skin: Thin skin, no rashes  Resolved Hospital Problem list     Assessment & Plan:   Acute hypoxemic respiratory failure in setting of known aspiration risk, suspected aspiration pneumonia - Intubated in the ED, wife confirmed DNR but wanted intubation P:  -Discussed his ongoing care with his wife.  I agree with previous assessments that he is likely to have recovery to his previous functional status, which was limited at baseline.  I worry that with prolonged critical illness he is likely to accumulate additional issues that may place  him at risk of prolonged suffering without significant benefit. -Continue focusing on his comfort.  No additional family coming to bedside per his wife.  Planning for terminal extubation later this morning. -DC antibiotics -Adding midazolam, increase fentanyl to promote comfort and treat air hunger  Septic shock and severe sepsis secondary to aspiration pneumonia +/- UTI.  Urine  culture 3/7 negative -DC antibiotics as we transition towards focusing on comfort -We will discontinue vasopressors at the time of extubation  Acute Kidney Injury, oliguric/anuric Hypernatremia, improved Hypokalemia, hypomagnesemia -Not a candidate for renal replacement therapy -No additional lab monitoring  Stress NSTEMI, due to sepsis, demand Atrial fibrillation -Discontinue amiodarone -Not a reasonable candidate for systemic anticoagulation  Hypothyroidism -Discontinue Synthroid  History of dens fracture -Continue c-collar  Hyperglycemia -Discontinue monitoring  Acute on chronic anemia, likely due to chronic illness -No additional monitoring  Best practice (evaluated daily)  Diet: NPO Pain/Anxiety/Delirium protocol (if indicated): Fentanyl, precedex VAP protocol (if indicated): HOB 30 degrees, suction prn DVT prophylaxis: heparin GI prophylaxis: PPI Glucose control: SSI Mobility: bed rest Disposition: transition to comfort care, ICU  Goals of Care:  Last date of multidisciplinary goals of care discussion: 3/11  family and staff present: Wife, critical care RN Summary of discussion: Wife at bedside, no additional family coming today. Discussed his ongoing care and poor prognosis. Planning to withdraw aggressive care today Follow up goals of care discussion due:  Code Status: DNR   Labs   CBC: Recent Labs  Lab 11/12/2020 1635 10/29/2020 2057 10/31/20 0121 11/01/20 0225 11/02/20 0213 11/02/20 2027  WBC 17.8* 16.6* 20.2* 29.3* 27.0* 23.4*  NEUTROABS 16.6*  --   --   --   --   --   HGB 12.7* 14.0 11.6* 11.1* 11.0* 10.5*  HCT 41.6 42.8 35.4* 34.3* 34.4* 33.1*  MCV 106.1* 102.4* 102.3* 102.7* 102.1* 101.8*  PLT 267 273 234 186 171 145*    Basic Metabolic Panel: Recent Labs  Lab 11/02/2020 2057 10/31/20 0121 11/01/20 0225 11/02/20 0213 11/02/20 2027  NA 150* 147* 137 136 137  K 3.4* 3.3* 3.3* 3.2* 3.7  CL 116* 118* 108 106 110  CO2 20* 17* 17* 18* 17*   GLUCOSE 109* 126* 119* 183* 225*  BUN 42* 47* 48* 51* 58*  CREATININE 1.99* 2.01* 2.05* 1.95* 1.87*  CALCIUM 11.4* 10.8* 10.2 9.5 9.0  MG  --  1.4* 1.8 1.6* 2.1  PHOS  --  2.8 3.3  --   --    GFR: Estimated Creatinine Clearance: 26.8 mL/min (A) (by C-G formula based on SCr of 1.87 mg/dL (H)). Recent Labs  Lab 11/12/2020 1625 11/20/2020 1635 11/08/2020 1731 10/27/2020 2057 10/31/20 0121 11/01/20 0225 11/02/20 0213 11/02/20 2027  PROCALCITON  --   --   --  3.67 5.32 6.18  --   --   WBC  --    < >  --  16.6* 20.2* 29.3* 27.0* 23.4*  LATICACIDVEN 3.2*  --  2.7*  --   --   --   --   --    < > = values in this interval not displayed.    Liver Function Tests: Recent Labs  Lab 10/28/2020 1635 11/13/2020 2057 11/01/20 0225  AST 42* 55* 40  ALT 22 25 27   ALKPHOS 89 86 82  BILITOT 2.1* 2.0* 1.3*  PROT 5.1* 5.4* 4.5*  ALBUMIN 1.7* 1.8* 1.2*   No results for input(s): LIPASE, AMYLASE in the last 168 hours. No results for input(s): AMMONIA in the  last 168 hours.  ABG    Component Value Date/Time   PHART 7.378 11/12/2020 1633   PCO2ART 40.8 11/01/2020 1633   PO2ART 100 11/06/2020 1633   HCO3 21.0 10/31/2020 0418   TCO2 25 11/13/2020 1633   ACIDBASEDEF 4.4 (H) 10/31/2020 0418   O2SAT 81.5 10/31/2020 0418     Coagulation Profile: No results for input(s): INR, PROTIME in the last 168 hours.  Cardiac Enzymes: No results for input(s): CKTOTAL, CKMB, CKMBINDEX, TROPONINI in the last 168 hours.  HbA1C: Hgb A1c MFr Bld  Date/Time Value Ref Range Status  10/24/2020 04:35 PM 5.2 4.8 - 5.6 % Final    Comment:    (NOTE) Pre diabetes:          5.7%-6.4%  Diabetes:              >6.4%  Glycemic control for   <7.0% adults with diabetes   10/25/2017 07:08 PM 4.8 4.8 - 5.6 % Final    Comment:    (NOTE) Pre diabetes:          5.7%-6.4% Diabetes:              >6.4% Glycemic control for   <7.0% adults with diabetes     CBG: Recent Labs  Lab 11/02/20 1530 11/02/20 1924  11/02/20 2308 11-19-20 0318 11/19/20 0735  GLUCAP 134* 152* 139* 154* 163*   This patient is critically ill with multiple organ system failure which requires frequent high complexity decision making, assessment, support, evaluation, and titration of therapies. This was completed through the application of advanced monitoring technologies and extensive interpretation of multiple databases. During this encounter critical care time was devoted to patient care services described in this note for 36 minutes.  Julian Hy, DO 11-19-20 8:36 AM [facility]  From 7AM- 7PM if no response to pager, please call [phone]. After hours, 7PM- 7AM, please call Elink  [phone].

## 2020-11-24 DEATH — deceased
